# Patient Record
Sex: Female | Born: 1940 | Race: White | Hispanic: No | Marital: Married | Smoking: Former smoker
Health system: Southern US, Community
[De-identification: ages and names within clinical notes are randomized; demographics above are authoritative.]

## PROBLEM LIST (undated history)

## (undated) ENCOUNTER — Ambulatory Visit: Admission: EM | Payer: Medicare PPO | Source: Home / Self Care

## (undated) DIAGNOSIS — IMO0001 Reserved for inherently not codable concepts without codable children: Secondary | ICD-10-CM

## (undated) DIAGNOSIS — Z87442 Personal history of urinary calculi: Secondary | ICD-10-CM

## (undated) DIAGNOSIS — C439 Malignant melanoma of skin, unspecified: Secondary | ICD-10-CM

## (undated) DIAGNOSIS — J45909 Unspecified asthma, uncomplicated: Secondary | ICD-10-CM

## (undated) DIAGNOSIS — Z8249 Family history of ischemic heart disease and other diseases of the circulatory system: Secondary | ICD-10-CM

## (undated) DIAGNOSIS — I219 Acute myocardial infarction, unspecified: Secondary | ICD-10-CM

## (undated) DIAGNOSIS — C801 Malignant (primary) neoplasm, unspecified: Secondary | ICD-10-CM

## (undated) DIAGNOSIS — J449 Chronic obstructive pulmonary disease, unspecified: Secondary | ICD-10-CM

## (undated) DIAGNOSIS — Z8719 Personal history of other diseases of the digestive system: Secondary | ICD-10-CM

## (undated) HISTORY — PX: MELANOMA EXCISION: SHX5266

## (undated) HISTORY — DX: Malignant melanoma of skin, unspecified: C43.9

## (undated) HISTORY — DX: Family history of ischemic heart disease and other diseases of the circulatory system: Z82.49

## (undated) HISTORY — DX: Malignant (primary) neoplasm, unspecified: C80.1

## (undated) HISTORY — DX: Chronic obstructive pulmonary disease, unspecified: J44.9

## (undated) HISTORY — PX: TUBAL LIGATION: SHX77

## (undated) HISTORY — PX: EYE SURGERY: SHX253

## (undated) HISTORY — PX: COLONOSCOPY: SHX174

## (undated) HISTORY — PX: APPENDECTOMY: SHX54

## (undated) HISTORY — PX: HERNIA REPAIR: SHX51

---

## 1991-07-13 HISTORY — PX: OTHER SURGICAL HISTORY: SHX169

## 1999-04-07 ENCOUNTER — Other Ambulatory Visit: Admission: RE | Admit: 1999-04-07 | Discharge: 1999-04-07 | Payer: Self-pay | Admitting: Obstetrics and Gynecology

## 2000-04-20 ENCOUNTER — Other Ambulatory Visit: Admission: RE | Admit: 2000-04-20 | Discharge: 2000-04-20 | Payer: Self-pay | Admitting: Obstetrics and Gynecology

## 2001-09-01 ENCOUNTER — Other Ambulatory Visit: Admission: RE | Admit: 2001-09-01 | Discharge: 2001-09-01 | Payer: Self-pay | Admitting: Obstetrics and Gynecology

## 2002-09-17 ENCOUNTER — Other Ambulatory Visit: Admission: RE | Admit: 2002-09-17 | Discharge: 2002-09-17 | Payer: Self-pay | Admitting: Obstetrics and Gynecology

## 2003-10-15 ENCOUNTER — Other Ambulatory Visit: Admission: RE | Admit: 2003-10-15 | Discharge: 2003-10-15 | Payer: Self-pay | Admitting: Obstetrics and Gynecology

## 2003-12-24 ENCOUNTER — Other Ambulatory Visit: Admission: RE | Admit: 2003-12-24 | Discharge: 2003-12-24 | Payer: Self-pay | Admitting: Obstetrics and Gynecology

## 2003-12-30 ENCOUNTER — Ambulatory Visit: Admission: RE | Admit: 2003-12-30 | Discharge: 2003-12-30 | Payer: Self-pay | Admitting: Internal Medicine

## 2004-01-14 ENCOUNTER — Ambulatory Visit (HOSPITAL_COMMUNITY): Admission: RE | Admit: 2004-01-14 | Discharge: 2004-01-14 | Payer: Self-pay | Admitting: Cardiology

## 2004-05-13 ENCOUNTER — Other Ambulatory Visit: Admission: RE | Admit: 2004-05-13 | Discharge: 2004-05-13 | Payer: Self-pay | Admitting: Obstetrics and Gynecology

## 2004-11-18 ENCOUNTER — Other Ambulatory Visit: Admission: RE | Admit: 2004-11-18 | Discharge: 2004-11-18 | Payer: Self-pay | Admitting: Obstetrics and Gynecology

## 2005-05-19 ENCOUNTER — Other Ambulatory Visit: Admission: RE | Admit: 2005-05-19 | Discharge: 2005-05-19 | Payer: Self-pay | Admitting: Obstetrics and Gynecology

## 2005-12-14 ENCOUNTER — Encounter: Admission: RE | Admit: 2005-12-14 | Discharge: 2005-12-14 | Payer: Self-pay | Admitting: Obstetrics and Gynecology

## 2008-02-26 ENCOUNTER — Encounter: Admission: RE | Admit: 2008-02-26 | Discharge: 2008-02-26 | Payer: Self-pay | Admitting: Obstetrics and Gynecology

## 2008-03-26 ENCOUNTER — Emergency Department (HOSPITAL_COMMUNITY): Admission: EM | Admit: 2008-03-26 | Discharge: 2008-03-26 | Payer: Self-pay | Admitting: Internal Medicine

## 2010-08-02 ENCOUNTER — Encounter: Payer: Self-pay | Admitting: Obstetrics and Gynecology

## 2011-04-12 LAB — CBC
HCT: 41.8
MCHC: 33.2
MCV: 93.5
RBC: 4.47
RDW: 14

## 2011-04-12 LAB — POCT I-STAT, CHEM 8
Calcium, Ion: 1.19
Sodium: 139
TCO2: 26

## 2011-04-12 LAB — DIFFERENTIAL
Basophils Absolute: 0
Eosinophils Absolute: 0
Eosinophils Relative: 1
Neutrophils Relative %: 63

## 2011-04-12 LAB — POCT CARDIAC MARKERS
CKMB, poc: 1.3
Troponin i, poc: <0.05

## 2011-10-12 ENCOUNTER — Other Ambulatory Visit: Payer: Self-pay | Admitting: Obstetrics and Gynecology

## 2011-10-12 DIAGNOSIS — R928 Other abnormal and inconclusive findings on diagnostic imaging of breast: Secondary | ICD-10-CM

## 2011-10-26 ENCOUNTER — Ambulatory Visit
Admission: RE | Admit: 2011-10-26 | Discharge: 2011-10-26 | Disposition: A | Discharge status: Home / Self Care | Payer: Medicare Other | Source: Ambulatory Visit | Attending: Obstetrics and Gynecology | Admitting: Obstetrics and Gynecology

## 2011-10-26 ENCOUNTER — Other Ambulatory Visit: Payer: Self-pay | Admitting: Obstetrics and Gynecology

## 2011-10-26 DIAGNOSIS — R928 Other abnormal and inconclusive findings on diagnostic imaging of breast: Secondary | ICD-10-CM

## 2011-11-16 ENCOUNTER — Ambulatory Visit (INDEPENDENT_AMBULATORY_CARE_PROVIDER_SITE_OTHER): Payer: Medicare Other | Admitting: General Surgery

## 2011-12-07 ENCOUNTER — Encounter (INDEPENDENT_AMBULATORY_CARE_PROVIDER_SITE_OTHER): Payer: Self-pay | Admitting: General Surgery

## 2011-12-07 ENCOUNTER — Ambulatory Visit (INDEPENDENT_AMBULATORY_CARE_PROVIDER_SITE_OTHER): Payer: Medicare Other | Admitting: General Surgery

## 2011-12-07 VITALS — BP 132/78 | HR 78 | Temp 98.2°F | Ht 60.0 in | Wt 105.6 lb

## 2011-12-07 DIAGNOSIS — R222 Localized swelling, mass and lump, trunk: Secondary | ICD-10-CM | POA: Insufficient documentation

## 2011-12-07 NOTE — Patient Instructions (Signed)
We will schedule your operation today. 

## 2011-12-07 NOTE — Progress Notes (Signed)
Patient ID: Sarah Pena, female   DOB: 01/16/1941, 71 y.o.   MRN: [id number]  Chief Complaint  Patient presents with  . Pre-op Exam    eval seb cyst Lt br    HPI Sarah Pena is a 71 y.o. female.   HPI  She is referred by Dr. Henderson Cloud for evaluation of a chest wall mass.  She noticed it about a year ago.  It is close to where she had a melanoma removed in the past.  It has not grown.  It does cause her some discomfort at times.  No hx of breast cancer.  No family hx of breast cancer  Past Medical History  Diagnosis Date  . Cancer     Melanoma of chest wall    Past Surgical History  Procedure Date  . Appendectomy   . Tubal ligation   . Lipoma removed 1993    RLQ area  . Hernia repair     RIH  . Melanoma excision     Family History  Problem Relation Age of Onset  . Heart disease Mother   . Heart disease Father     Social History History  Substance Use Topics  . Smoking status: Current Everyday Smoker -- 1.0 packs/day    Types: Cigarettes  . Smokeless tobacco: Not on file  . Alcohol Use: Yes     socially    No Known Allergies  Current Outpatient Prescriptions  Medication Sig Dispense Refill  . aspirin 81 MG tablet Take 81 mg by mouth daily.        Review of Systems Review of Systems  Constitutional: Negative.   Respiratory: Negative.   Cardiovascular: Negative.     Blood pressure 132/78, pulse 78, temperature 98.2 F (36.8 C), temperature source Temporal, height 5' (1.524 m), weight 105 lb 9.6 oz (47.9 kg), SpO2 98.00%.  Physical Exam Physical Exam  Constitutional: No distress.       Thin, tan female.  Neck: Neck supple.  Pulmonary/Chest:       1.5 cm lower sternal mobile firm nodule near a scar.  No breast masses.  Musculoskeletal:       No axillary or supraclavicular adenopathy.  Lymphadenopathy:    She has no cervical adenopathy.    Data Reviewed None  Assessment    Midsternal chest wall nodule in area of previous melanoma  excision.    Plan    Removal of chest wall nodule.  The procedure, aftercare, and risks (including but not limited to bleeding, infection, wound healing problems) were discussed with there.  She seems to understand and agrees with the plan.       Briahna Pescador J 12/07/2011, 1:52 PM

## 2011-12-14 ENCOUNTER — Other Ambulatory Visit (INDEPENDENT_AMBULATORY_CARE_PROVIDER_SITE_OTHER): Payer: Self-pay | Admitting: General Surgery

## 2011-12-14 DIAGNOSIS — L723 Sebaceous cyst: Secondary | ICD-10-CM

## 2011-12-16 ENCOUNTER — Telehealth (INDEPENDENT_AMBULATORY_CARE_PROVIDER_SITE_OTHER): Payer: Self-pay

## 2011-12-16 NOTE — Telephone Encounter (Signed)
Pt notified of benign pathology results

## 2011-12-27 ENCOUNTER — Encounter (INDEPENDENT_AMBULATORY_CARE_PROVIDER_SITE_OTHER): Payer: Self-pay | Admitting: General Surgery

## 2011-12-27 ENCOUNTER — Ambulatory Visit (INDEPENDENT_AMBULATORY_CARE_PROVIDER_SITE_OTHER): Payer: Medicare Other | Admitting: General Surgery

## 2011-12-27 VITALS — BP 138/72 | HR 66 | Temp 97.3°F | Resp 12 | Ht 60.0 in | Wt 105.4 lb

## 2011-12-27 DIAGNOSIS — Z9889 Other specified postprocedural states: Secondary | ICD-10-CM

## 2011-12-27 NOTE — Progress Notes (Signed)
She is here for her first postoperative visit after removal of a chest wall mass on December 14, 2011. Pathology demonstrates a benign epidermoid inclusion cyst. She has done well overall. She has had minimal drainage from the incision.  Exam  Chest-small midsternal incision is clean and intact with no drainage   Assessment: Wound is healing well and pathology is benign.  Plan: Return visit p.r.n.

## 2011-12-27 NOTE — Patient Instructions (Signed)
Call if you have any wound problems. 

## 2012-01-07 ENCOUNTER — Encounter (INDEPENDENT_AMBULATORY_CARE_PROVIDER_SITE_OTHER): Payer: Medicare Other | Admitting: General Surgery

## 2014-05-07 ENCOUNTER — Other Ambulatory Visit: Payer: Self-pay | Admitting: Obstetrics and Gynecology

## 2014-05-08 LAB — CYTOLOGY - PAP

## 2014-08-05 ENCOUNTER — Other Ambulatory Visit: Payer: Self-pay | Admitting: Obstetrics and Gynecology

## 2014-08-06 ENCOUNTER — Ambulatory Visit (INDEPENDENT_AMBULATORY_CARE_PROVIDER_SITE_OTHER): Payer: Medicare PPO | Admitting: Cardiovascular Disease

## 2014-08-06 ENCOUNTER — Encounter: Payer: Self-pay | Admitting: Cardiovascular Disease

## 2014-08-06 VITALS — BP 128/70 | HR 88 | Ht 60.0 in | Wt 110.0 lb

## 2014-08-06 DIAGNOSIS — J449 Chronic obstructive pulmonary disease, unspecified: Secondary | ICD-10-CM | POA: Insufficient documentation

## 2014-08-06 DIAGNOSIS — R079 Chest pain, unspecified: Secondary | ICD-10-CM

## 2014-08-06 DIAGNOSIS — J441 Chronic obstructive pulmonary disease with (acute) exacerbation: Secondary | ICD-10-CM | POA: Insufficient documentation

## 2014-08-06 HISTORY — DX: Chest pain, unspecified: R07.9

## 2014-08-06 NOTE — Patient Instructions (Signed)
Follow up as needed with Dr Gwenlyn Found.

## 2014-08-06 NOTE — Assessment & Plan Note (Signed)
The patient was recently hospitalized at [facility] in [place] [address] for chest pain/rule out myocardial infarction. Her enzymes are negative. A stress echo was normal. She has had no recurrent chest pain. She does have positive risk factors for cardiac disease including family history and recently discontinued tobacco abuse.given her negative stress test the likelihood that this was ischemic is small. She is on Zantac. Continue current medical care.

## 2014-08-06 NOTE — Progress Notes (Signed)
08/06/2014 Sarah Pena   1940/11/24  [id number]  Primary Physician Horatio Pel, MD Primary Cardiologist:Skylen Spiering Adora Fridge MD Renae Gloss   HPI:  Sarah Pena is a 74 year old thin-appearing married Caucasian female patient of Dr. Thayer Jew Pharr's who I'm seen back today after last being seen 6 years ago for chest pain. She has a long history of tobacco abuse having recently discontinued this 3 months ago after being treated for pneumonia. She has a strong family history of heart disease with both parents died of heart-related issues. She had a negative stress test in our office 04/16/08. She has been relatively stable until recently when she had an episode of chest pain and was admitted to [facility]. She ruled out for myocardial infarction and underwent a stress echo which was entirely normal. Recent lab work performed by her primary care physician on 07/24/14 revealed total cholesterol 188, LDL of 90 and HDL of 65.   Current Outpatient Prescriptions  Medication Sig Dispense Refill  . aspirin 81 MG tablet Take 81 mg by mouth daily.    . Calcium-Vitamin D (CALTRATE 600 PLUS-VIT D PO) Take by mouth 2 (two) times daily.    . Magnesium 400 MG CAPS Take 400 mg by mouth daily.    . Omega-3 Krill Oil 1000 MG CAPS Take by mouth daily.    Marland Kitchen PREMARIN vaginal cream Place 1 Applicatorful vaginally 2 (two) times a week.   3  . ranitidine (ZANTAC) 150 MG capsule Take 150 mg by mouth 2 (two) times daily.     No current facility-administered medications for this visit.    No Known Allergies  History   Social History  . Marital Status: Married    Spouse Name: N/A    Number of Children: N/A  . Years of Education: N/A   Occupational History  . Not on file.   Social History Main Topics  . Smoking status: Current Every Day Smoker -- 1.00 packs/day    Types: Cigarettes  . Smokeless tobacco: Not on file  . Alcohol Use: Yes     Comment: socially  . Drug Use: No  .  Sexual Activity: Not on file   Other Topics Concern  . Not on file   Social History Narrative     Review of Systems: General: negative for chills, fever, night sweats or weight changes.  Cardiovascular: negative for chest pain, dyspnea on exertion, edema, orthopnea, palpitations, paroxysmal nocturnal dyspnea or shortness of breath Dermatological: negative for rash Respiratory: negative for cough or wheezing Urologic: negative for hematuria Abdominal: negative for nausea, vomiting, diarrhea, bright red blood per rectum, melena, or hematemesis Neurologic: negative for visual changes, syncope, or dizziness All other systems reviewed and are otherwise negative except as noted above.    Blood pressure 128/70, pulse 88, height 5' (1.524 m), weight 110 lb (49.896 kg).  General appearance: alert and no distress Neck: no adenopathy, no carotid bruit, no JVD, supple, symmetrical, trachea midline and thyroid not enlarged, symmetric, no tenderness/mass/nodules Lungs: clear to auscultation bilaterally Heart: regular rate and rhythm, S1, S2 normal, no murmur, click, rub or gallop Extremities: extremities normal, atraumatic, no cyanosis or edema and 2+ pedal pulses bilaterally  EKG normal sinus rhythm at 81 with septal Q waves. I personally reviewed this EKG  ASSESSMENT AND PLAN:   Chest pain The patient was recently hospitalized at [facility] in [place] for chest pain/rule out myocardial infarction. Her enzymes are negative. A stress echo was  normal. She has had no recurrent chest pain. She does have positive risk factors for cardiac disease including family history and recently discontinued tobacco abuse.given her negative stress test the likelihood that this was ischemic is small. She is on Zantac. Continue current medical care.       [name] MD FACP,FACC,FAHA, [facility] 08/06/2014 3:22 PM

## 2014-08-08 ENCOUNTER — Other Ambulatory Visit: Payer: Self-pay | Admitting: Gastroenterology

## 2014-08-08 DIAGNOSIS — R1084 Generalized abdominal pain: Secondary | ICD-10-CM

## 2014-08-14 ENCOUNTER — Ambulatory Visit
Admission: RE | Admit: 2014-08-14 | Discharge: 2014-08-14 | Disposition: A | Discharge status: Home / Self Care | Payer: Medicare PPO | Source: Ambulatory Visit | Attending: Gastroenterology | Admitting: Gastroenterology

## 2014-08-14 DIAGNOSIS — R1084 Generalized abdominal pain: Secondary | ICD-10-CM

## 2014-08-15 ENCOUNTER — Other Ambulatory Visit: Payer: Medicare Other

## 2014-08-19 ENCOUNTER — Ambulatory Visit
Admission: RE | Admit: 2014-08-19 | Discharge: 2014-08-19 | Disposition: A | Discharge status: Home / Self Care | Payer: Medicare PPO | Source: Ambulatory Visit | Attending: Gastroenterology | Admitting: Gastroenterology

## 2014-08-19 DIAGNOSIS — R1084 Generalized abdominal pain: Secondary | ICD-10-CM

## 2014-09-23 ENCOUNTER — Other Ambulatory Visit: Payer: Self-pay | Admitting: Gastroenterology

## 2014-09-23 ENCOUNTER — Encounter: Payer: Self-pay | Admitting: Internal Medicine

## 2014-10-16 ENCOUNTER — Other Ambulatory Visit: Payer: Self-pay | Admitting: General Surgery

## 2014-10-28 ENCOUNTER — Encounter (HOSPITAL_COMMUNITY): Payer: Self-pay | Admitting: *Deleted

## 2014-10-28 MED ORDER — CEFAZOLIN SODIUM-DEXTROSE 2-3 GM-% IV SOLR
2.0000 g | INTRAVENOUS | Status: AC
  Administered 2014-10-29: 2 g via INTRAVENOUS
  Filled 2014-10-28: qty 50

## 2014-10-29 ENCOUNTER — Ambulatory Visit (HOSPITAL_COMMUNITY): Payer: Medicare PPO | Admitting: Anesthesiology

## 2014-10-29 ENCOUNTER — Ambulatory Visit (HOSPITAL_COMMUNITY)
Admission: RE | Admit: 2014-10-29 | Discharge: 2014-10-29 | Disposition: A | Discharge status: Home / Self Care | Payer: Medicare PPO | Source: Ambulatory Visit | Attending: General Surgery | Admitting: General Surgery

## 2014-10-29 ENCOUNTER — Encounter (HOSPITAL_COMMUNITY)
Admission: RE | Disposition: A | Discharge status: Home / Self Care | Payer: Self-pay | Source: Ambulatory Visit | Attending: General Surgery

## 2014-10-29 ENCOUNTER — Ambulatory Visit (HOSPITAL_COMMUNITY): Payer: Medicare PPO

## 2014-10-29 ENCOUNTER — Encounter (HOSPITAL_COMMUNITY): Payer: Self-pay | Admitting: *Deleted

## 2014-10-29 DIAGNOSIS — Z87891 Personal history of nicotine dependence: Secondary | ICD-10-CM | POA: Diagnosis not present

## 2014-10-29 DIAGNOSIS — Z9851 Tubal ligation status: Secondary | ICD-10-CM | POA: Insufficient documentation

## 2014-10-29 DIAGNOSIS — Z85820 Personal history of malignant melanoma of skin: Secondary | ICD-10-CM | POA: Insufficient documentation

## 2014-10-29 DIAGNOSIS — K219 Gastro-esophageal reflux disease without esophagitis: Secondary | ICD-10-CM | POA: Insufficient documentation

## 2014-10-29 DIAGNOSIS — J45909 Unspecified asthma, uncomplicated: Secondary | ICD-10-CM | POA: Insufficient documentation

## 2014-10-29 DIAGNOSIS — K8020 Calculus of gallbladder without cholecystitis without obstruction: Secondary | ICD-10-CM | POA: Insufficient documentation

## 2014-10-29 DIAGNOSIS — Z90710 Acquired absence of both cervix and uterus: Secondary | ICD-10-CM | POA: Diagnosis not present

## 2014-10-29 DIAGNOSIS — J449 Chronic obstructive pulmonary disease, unspecified: Secondary | ICD-10-CM | POA: Diagnosis not present

## 2014-10-29 DIAGNOSIS — I252 Old myocardial infarction: Secondary | ICD-10-CM | POA: Diagnosis not present

## 2014-10-29 HISTORY — DX: Unspecified asthma, uncomplicated: J45.909

## 2014-10-29 HISTORY — DX: Personal history of other diseases of the digestive system: Z87.19

## 2014-10-29 HISTORY — DX: Personal history of urinary calculi: Z87.442

## 2014-10-29 HISTORY — DX: Acute myocardial infarction, unspecified: I21.9

## 2014-10-29 HISTORY — PX: CHOLECYSTECTOMY: SHX55

## 2014-10-29 HISTORY — DX: Reserved for inherently not codable concepts without codable children: IMO0001

## 2014-10-29 LAB — COMPREHENSIVE METABOLIC PANEL
ALT: 17 U/L (ref 0–35)
ANION GAP: 7 (ref 5–15)
AST: 24 U/L (ref 0–37)
Albumin: 3.4 g/dL — ABNORMAL LOW (ref 3.5–5.2)
Alkaline Phosphatase: 70 U/L (ref 39–117)
BILIRUBIN TOTAL: 0.5 mg/dL (ref 0.3–1.2)
BUN: 10 mg/dL (ref 6–23)
CALCIUM: 9.2 mg/dL (ref 8.4–10.5)
CO2: 27 mmol/L (ref 19–32)
Chloride: 105 mmol/L (ref 96–112)
Creatinine, Ser: 0.69 mg/dL (ref 0.50–1.10)
GFR calc Af Amer: >90 mL/min (ref >90)
GFR calc non Af Amer: 85 mL/min — ABNORMAL LOW (ref >90)
Glucose, Bld: 91 mg/dL (ref 70–99)
Potassium: 4.5 mmol/L (ref 3.5–5.1)
Sodium: 139 mmol/L (ref 135–145)
TOTAL PROTEIN: 6.4 g/dL (ref 6.0–8.3)

## 2014-10-29 LAB — CBC WITH DIFFERENTIAL/PLATELET
BASOS ABS: 0.1 10*3/uL (ref 0.0–0.1)
BASOS PCT: 1 % (ref 0–1)
Eosinophils Absolute: 0.5 10*3/uL (ref 0.0–0.7)
Eosinophils Relative: 8 % — ABNORMAL HIGH (ref 0–5)
HEMATOCRIT: 40.4 % (ref 36.0–46.0)
HEMOGLOBIN: 13 g/dL (ref 12.0–15.0)
Lymphocytes Relative: 36 % (ref 12–46)
Lymphs Abs: 2.1 10*3/uL (ref 0.7–4.0)
MCH: 29.5 pg (ref 26.0–34.0)
MCHC: 32.2 g/dL (ref 30.0–36.0)
MCV: 91.8 fL (ref 78.0–100.0)
MONOS PCT: 11 % (ref 3–12)
Monocytes Absolute: 0.6 10*3/uL (ref 0.1–1.0)
Neutro Abs: 2.5 10*3/uL (ref 1.7–7.7)
Neutrophils Relative %: 44 % (ref 43–77)
Platelets: 219 10*3/uL (ref 150–400)
RBC: 4.4 MIL/uL (ref 3.87–5.11)
RDW: 14.1 % (ref 11.5–15.5)
WBC: 5.7 10*3/uL (ref 4.0–10.5)

## 2014-10-29 LAB — PROTIME-INR
INR: 0.94 (ref 0.00–1.49)
PROTHROMBIN TIME: 12.6 s (ref 11.6–15.2)

## 2014-10-29 SURGERY — LAPAROSCOPIC CHOLECYSTECTOMY WITH INTRAOPERATIVE CHOLANGIOGRAM
Anesthesia: General | Site: Abdomen

## 2014-10-29 MED ORDER — BUPIVACAINE-EPINEPHRINE (PF) 0.25% -1:200000 IJ SOLN
INTRAMUSCULAR | Status: AC
  Filled 2014-10-29: qty 30

## 2014-10-29 MED ORDER — SCOPOLAMINE 1 MG/3DAYS TD PT72SCOPOLAMINE 1 MG/3DAYS
1.0000 | MEDICATED_PATCH | TRANSDERMAL | Status: DC
Start: 2014-10-29 — End: 2014-10-29
  Administered 2014-10-29: 1.5 mg via TRANSDERMAL
  Filled 2014-10-29: qty 1

## 2014-10-29 MED ORDER — MIDAZOLAM HCL 2 MG/2ML IJ SOLN
INTRAMUSCULAR | Status: AC
  Filled 2014-10-29: qty 2

## 2014-10-29 MED ORDER — DEXTROSE IN LACTATED RINGERS 5 % IV SOLN: INTRAVENOUS | Status: DC

## 2014-10-29 MED ORDER — PROPOFOL 10 MG/ML IV BOLUS
INTRAVENOUS | Status: DC | PRN
  Administered 2014-10-29: 150 mg via INTRAVENOUS

## 2014-10-29 MED ORDER — FENTANYL CITRATE (PF) 250 MCG/5ML IJ SOLN
INTRAMUSCULAR | Status: AC
  Filled 2014-10-29: qty 5

## 2014-10-29 MED ORDER — ROCURONIUM BROMIDE 100 MG/10ML IV SOLN
INTRAVENOUS | Status: DC | PRN
  Administered 2014-10-29: 25 mg via INTRAVENOUS

## 2014-10-29 MED ORDER — KETOROLAC TROMETHAMINE 30 MG/ML IJ SOLN
INTRAMUSCULAR | Status: AC
  Filled 2014-10-29: qty 1

## 2014-10-29 MED ORDER — GLYCOPYRROLATE 0.2 MG/ML IJ SOLN
INTRAMUSCULAR | Status: DC | PRN
  Administered 2014-10-29: 0.6 mg via INTRAVENOUS

## 2014-10-29 MED ORDER — 0.9 % SODIUM CHLORIDE (POUR BTL) OPTIME
TOPICAL | Status: DC | PRN
  Administered 2014-10-29: 1000 mL

## 2014-10-29 MED ORDER — GLYCOPYRROLATE 0.2 MG/ML IJ SOLN
INTRAMUSCULAR | Status: AC
  Filled 2014-10-29: qty 3

## 2014-10-29 MED ORDER — SODIUM CHLORIDE 0.9 % IV SOLN
INTRAVENOUS | Status: DC | PRN
  Administered 2014-10-29: 8 mL

## 2014-10-29 MED ORDER — HYDROCODONE-ACETAMINOPHEN 5-325 MG PO TABS: 1.0000 | ORAL_TABLET | ORAL | Status: DC | PRN

## 2014-10-29 MED ORDER — PROMETHAZINE HCL 25 MG/ML IJ SOLN
INTRAMUSCULAR | Status: AC
  Filled 2014-10-29: qty 1

## 2014-10-29 MED ORDER — PHENYLEPHRINE 40 MCG/ML (10ML) SYRINGE FOR IV PUSH (FOR BLOOD PRESSURE SUPPORT)
PREFILLED_SYRINGE | INTRAVENOUS | Status: AC
  Filled 2014-10-29: qty 10

## 2014-10-29 MED ORDER — OXYCODONE HCL 5 MG/5ML PO SOLN: 5.0000 mg | Freq: Once | ORAL | Status: DC | PRN

## 2014-10-29 MED ORDER — PHENYLEPHRINE HCL 10 MG/ML IJ SOLN
INTRAMUSCULAR | Status: DC | PRN
  Administered 2014-10-29: 80 ug via INTRAVENOUS

## 2014-10-29 MED ORDER — ONDANSETRON HCL 4 MG PO TABS: 4.0000 mg | ORAL_TABLET | Freq: Three times a day (TID) | ORAL | Status: DC | PRN

## 2014-10-29 MED ORDER — ARTIFICIAL TEARS OP OINT
TOPICAL_OINTMENT | OPHTHALMIC | Status: DC | PRN
Start: 2014-10-29 — End: 2014-10-29
  Administered 2014-10-29: 1 via OPHTHALMIC

## 2014-10-29 MED ORDER — ONDANSETRON HCL 4 MG/2ML IJ SOLN
INTRAMUSCULAR | Status: AC
  Filled 2014-10-29: qty 2

## 2014-10-29 MED ORDER — EPHEDRINE SULFATE 50 MG/ML IJ SOLN
INTRAMUSCULAR | Status: DC | PRN
  Administered 2014-10-29: 5 mg via INTRAVENOUS

## 2014-10-29 MED ORDER — SODIUM CHLORIDE 0.9 % IR SOLN
Status: DC | PRN
  Administered 2014-10-29: 1000 mL

## 2014-10-29 MED ORDER — BUPIVACAINE-EPINEPHRINE 0.25% -1:200000 IJ SOLN
INTRAMUSCULAR | Status: DC | PRN
  Administered 2014-10-29: 13 mL

## 2014-10-29 MED ORDER — HYDROMORPHONE HCL 1 MG/ML IJ SOLN: 0.2500 mg | INTRAMUSCULAR | Status: DC | PRN

## 2014-10-29 MED ORDER — PROPOFOL 10 MG/ML IV BOLUS
INTRAVENOUS | Status: AC
  Filled 2014-10-29: qty 20

## 2014-10-29 MED ORDER — LACTATED RINGERS IV SOLN
INTRAVENOUS | Status: DC
  Administered 2014-10-29 (×2): via INTRAVENOUS

## 2014-10-29 MED ORDER — PROMETHAZINE HCL 25 MG/ML IJ SOLN
6.2500 mg | INTRAMUSCULAR | Status: DC | PRN
  Administered 2014-10-29: 6.25 mg via INTRAVENOUS

## 2014-10-29 MED ORDER — LIDOCAINE HCL (CARDIAC) 20 MG/ML IV SOLN
INTRAVENOUS | Status: DC | PRN
  Administered 2014-10-29: 80 mg via INTRAVENOUS

## 2014-10-29 MED ORDER — ONDANSETRON HCL 4 MG/2ML IJ SOLN
INTRAMUSCULAR | Status: DC | PRN
  Administered 2014-10-29: 4 mg via INTRAVENOUS

## 2014-10-29 MED ORDER — DEXAMETHASONE SODIUM PHOSPHATE 4 MG/ML IJ SOLN
INTRAMUSCULAR | Status: DC | PRN
  Administered 2014-10-29: 4 mg via INTRAVENOUS

## 2014-10-29 MED ORDER — ROCURONIUM BROMIDE 50 MG/5ML IV SOLN
INTRAVENOUS | Status: AC
  Filled 2014-10-29: qty 1

## 2014-10-29 MED ORDER — LIDOCAINE HCL (CARDIAC) 20 MG/ML IV SOLN
INTRAVENOUS | Status: AC
  Filled 2014-10-29: qty 5

## 2014-10-29 MED ORDER — FENTANYL CITRATE (PF) 100 MCG/2ML IJ SOLN
INTRAMUSCULAR | Status: DC | PRN
  Administered 2014-10-29: 50 ug via INTRAVENOUS
  Administered 2014-10-29: 100 ug via INTRAVENOUS
  Administered 2014-10-29: 50 ug via INTRAVENOUS

## 2014-10-29 MED ORDER — OXYCODONE HCL 5 MG PO TABS: 5.0000 mg | ORAL_TABLET | Freq: Once | ORAL | Status: DC | PRN

## 2014-10-29 MED ORDER — KETOROLAC TROMETHAMINE 15 MG/ML IJ SOLN
INTRAMUSCULAR | Status: DC | PRN
  Administered 2014-10-29: 15 mg via INTRAVENOUS

## 2014-10-29 MED ORDER — NEOSTIGMINE METHYLSULFATE 10 MG/10ML IV SOLN
INTRAVENOUS | Status: DC | PRN
  Administered 2014-10-29: 4 mg via INTRAVENOUS

## 2014-10-29 SURGICAL SUPPLY — 55 items
APL SKNCLS STERI-STRIP NONHPOA (GAUZE/BANDAGES/DRESSINGS) ×1
APPLIER CLIP 5 13 M/L LIGAMAX5 (MISCELLANEOUS) ×3
APR CLP MED LRG 5 ANG JAW (MISCELLANEOUS) ×1
BAG SPEC RTRVL LRG 6X4 10 (ENDOMECHANICALS) ×1
BENZOIN TINCTURE PRP APPL 2/3 (GAUZE/BANDAGES/DRESSINGS) ×3 IMPLANT
CANISTER SUCTION 2500CC (MISCELLANEOUS) ×3 IMPLANT
CHLORAPREP W/TINT 26ML (MISCELLANEOUS) ×3 IMPLANT
CLIP APPLIE 5 13 M/L LIGAMAX5 (MISCELLANEOUS) ×1 IMPLANT
CLOSURE WOUND 1/2 X4 (GAUZE/BANDAGES/DRESSINGS) ×1
COVER MAYO STAND STRL (DRAPES) ×3 IMPLANT
COVER SURGICAL LIGHT HANDLE (MISCELLANEOUS) ×3 IMPLANT
DECANTER SPIKE VIAL GLASS SM (MISCELLANEOUS) ×6 IMPLANT
DRAPE C-ARM 42X72 X-RAY (DRAPES) ×3 IMPLANT
DRAPE LAPAROSCOPIC ABDOMINAL (DRAPES) ×3 IMPLANT
DRSG TEGADERM 2-3/8X2-3/4 SM (GAUZE/BANDAGES/DRESSINGS) ×12 IMPLANT
ELECT REM PT RETURN 9FT ADLT (ELECTROSURGICAL) ×3
ELECTRODE REM PT RTRN 9FT ADLT (ELECTROSURGICAL) ×1 IMPLANT
GAUZE SPONGE 2X2 8PLY STRL LF (GAUZE/BANDAGES/DRESSINGS) ×1 IMPLANT
GLOVE BIO SURGEON STRL SZ8 (GLOVE) ×2 IMPLANT
GLOVE BIOGEL PI IND STRL 6.5 (GLOVE) IMPLANT
GLOVE BIOGEL PI IND STRL 7.0 (GLOVE) IMPLANT
GLOVE BIOGEL PI IND STRL 7.5 (GLOVE) IMPLANT
GLOVE BIOGEL PI IND STRL 8 (GLOVE) ×1 IMPLANT
GLOVE BIOGEL PI INDICATOR 6.5 (GLOVE) ×2
GLOVE BIOGEL PI INDICATOR 7.0 (GLOVE) ×2
GLOVE BIOGEL PI INDICATOR 7.5 (GLOVE) ×2
GLOVE BIOGEL PI INDICATOR 8 (GLOVE) ×4
GLOVE ECLIPSE 6.5 STRL STRAW (GLOVE) ×2 IMPLANT
GLOVE ECLIPSE 7.5 STRL STRAW (GLOVE) ×2 IMPLANT
GLOVE ECLIPSE 8.0 STRL XLNG CF (GLOVE) ×3 IMPLANT
GOWN STRL REUS W/ TWL LRG LVL3 (GOWN DISPOSABLE) ×3 IMPLANT
GOWN STRL REUS W/ TWL XL LVL3 (GOWN DISPOSABLE) IMPLANT
GOWN STRL REUS W/TWL LRG LVL3 (GOWN DISPOSABLE) ×12
GOWN STRL REUS W/TWL XL LVL3 (GOWN DISPOSABLE) ×3
KIT BASIN OR (CUSTOM PROCEDURE TRAY) ×3 IMPLANT
KIT ROOM TURNOVER OR (KITS) ×3 IMPLANT
NS IRRIG 1000ML POUR BTL (IV SOLUTION) ×3 IMPLANT
PAD ARMBOARD 7.5X6 YLW CONV (MISCELLANEOUS) ×3 IMPLANT
POUCH SPECIMEN RETRIEVAL 10MM (ENDOMECHANICALS) ×3 IMPLANT
SCISSORS LAP 5X35 DISP (ENDOMECHANICALS) ×3 IMPLANT
SET CHOLANGIOGRAPH 5 50 .035 (SET/KITS/TRAYS/PACK) ×3 IMPLANT
SET IRRIG TUBING LAPAROSCOPIC (IRRIGATION / IRRIGATOR) ×3 IMPLANT
SLEEVE ENDOPATH XCEL 5M (ENDOMECHANICALS) ×6 IMPLANT
SPECIMEN JAR SMALL (MISCELLANEOUS) ×3 IMPLANT
SPONGE GAUZE 2X2 STER 10/PKG (GAUZE/BANDAGES/DRESSINGS) ×2
STRIP CLOSURE SKIN 1/2X4 (GAUZE/BANDAGES/DRESSINGS) ×1 IMPLANT
SUT MNCRL AB 4-0 PS2 18 (SUTURE) ×2 IMPLANT
SUT MON AB 4-0 PC3 18 (SUTURE) ×3 IMPLANT
TOWEL OR 17X24 6PK STRL BLUE (TOWEL DISPOSABLE) ×3 IMPLANT
TOWEL OR 17X26 10 PK STRL BLUE (TOWEL DISPOSABLE) ×1 IMPLANT
TRAY LAPAROSCOPIC (CUSTOM PROCEDURE TRAY) ×3 IMPLANT
TROCAR XCEL BLUNT TIP 100MML (ENDOMECHANICALS) ×3 IMPLANT
TROCAR XCEL NON-BLD 11X100MML (ENDOMECHANICALS) IMPLANT
TROCAR XCEL NON-BLD 5MMX100MML (ENDOMECHANICALS) ×3 IMPLANT
TUBING INSUFFLATION (TUBING) ×3 IMPLANT

## 2014-10-29 NOTE — Anesthesia Postprocedure Evaluation (Signed)
Anesthesia Post Note  Patient: EMAAN GARY  Procedure(s) Performed: Procedure(s) (LRB): LAPAROSCOPIC CHOLECYSTECTOMY WITH INTRAOPERATIVE CHOLANGIOGRAM (N/A)  Anesthesia type: general  Patient location: PACU  Post pain: Pain level controlled  Post assessment: Patient's Cardiovascular Status Stable  Last Vitals:  Filed Vitals:   10/29/14 1220  BP: 145/67  Pulse: 69  Temp:   Resp: 11    Post vital signs: Reviewed and stable  Level of consciousness: sedated  Complications: No apparent anesthesia complications

## 2014-10-29 NOTE — Progress Notes (Signed)
Returning from lunch break.

## 2014-10-29 NOTE — Interval H&P Note (Signed)
History and Physical Interval Note:  10/29/2014 9:00 AM  Sarah Pena  has presented today for surgery, with the diagnosis of Cholelithiasis  The various methods of treatment have been discussed with the patient and family. After consideration of risks, benefits and other options for treatment, the patient has consented to  Procedure(s): LAPAROSCOPIC CHOLECYSTECTOMY WITH INTRAOPERATIVE CHOLANGIOGRAM (N/A) as a surgical intervention .  The patient's history has been reviewed, patient examined, no change in status, stable for surgery.  I have reviewed the patient's chart and labs.  Questions were answered to the patient's satisfaction.     Yides Saidi Lenna Sciara

## 2014-10-29 NOTE — Op Note (Signed)
Preoperative diagnosis:  Symptomatic cholelithiasis  Postoperative diagnosis:  same  Procedure: Laparoscopic cholecystectomy with cholangiogram.  Surgeon: Jackolyn Confer, M.D.  Asst.:  Catalina Antigua Tsuei  Anesthesia: General  Indication:   This is a 74 year old female which been having episodes of epigastric pain that her biliary colic-like. Upper endoscopy did not demonstrate any pathology to specifically address her symptoms. She has known cholelithiasis. Cardiac workup has been negative. She now presents for elective cholecystectomy.  Technique: She was brought to the operating room, placed supine on the operating table, and a general anesthetic was administered. The abdominal wall was then sterilely prepped and draped. Local anesthetic (Marcaine) was infiltrated in the subumbilical region. A small subumbilical incision was made through the skin, subcutaneous tissue, fascia, and peritoneum entering the peritoneal cavity under direct vision. A pursestring suture of 0 Vicryl was placed around the edges of the fascia. A Hassan trocar was introduced into the peritoneal cavity and a pneumoperitoneum was created by insufflation of carbon dioxide gas. The laparoscope was introduced into the trocar and no underlying bleeding or organ injury was noted. He/She was then placed in the reverse Trendelenburg position with the right side tilted slightly up.  Three 5 mm trocars were then placed into the abdominal cavity under laparoscopic vision. One in the epigastric area, and 2 in the right upper quadrant area. The gallbladder was visualized and the fundus was grasped and retracted toward the right shoulder.  The infundibulum was mobilized with dissection close to the gallbladder and retracted laterally. The cystic duct was identified and a window was created around it. The cystic artery was also identified and a window was created around it. The critical view was achieved. A clip was placed at the neck of the  gallbladder. A small incision was made in the cystic duct. A cholangiocatheter was introduced through the anterior abdominal wall and placed in the cystic duct. A intraoperative cholangiogram was then performed.  Under real-time fluoroscopy, dilute contrast was injected into the cystic duct.  The common hepatic duct, the right and left hepatic ducts, and the common duct were all visualized. Contrast drained into the duodenum without obvious evidence of any obstructing ductal lesion. The final report is pending the Radiologist's interpretation.  The cholangiocatheter was removed, the cystic duct was clipped 3 times on the biliary side, and then the cystic duct was divided sharply. No bile leak was noted from the cystic duct stump.  The cystic artery was then clipped and divided. Following this the gallbladder was dissected free from the liver using electrocautery. The gallbladder was then placed in a retrieval bag and removed from the abdominal cavity through the subumbilical incision.  The gallbladder fossa was inspected, irrigated, and bleeding was controlled with electrocautery. Inspection showed that hemostasis was adequate and there was no evidence of bile leak.  The irrigation fluid was evacuated as much as possible.  The subumbilical trocar was removed and the fascial defect was closed by tightening and tying down the pursestring suture under laparoscopic vision.  The remaining trocars were removed and the pneumoperitoneum was released. The skin incisions were closed with 4-0 Monocryl subcuticular stitches. Steri-Strips and sterile dressings were applied.  The procedure was well-tolerated without any apparent complications. She was taken to the recovery room in satisfactory condition.

## 2014-10-29 NOTE — Anesthesia Procedure Notes (Signed)
Procedure Name: Intubation Date/Time: 10/29/2014 9:23 AM Performed by: Ollen Bowl Pre-anesthesia Checklist: Patient identified, Emergency Drugs available, Suction available, Patient being monitored and Timeout performed Patient Re-evaluated:Patient Re-evaluated prior to inductionOxygen Delivery Method: Circle system utilized and Simple face mask Preoxygenation: Pre-oxygenation with 100% oxygen Intubation Type: IV induction Ventilation: Mask ventilation without difficulty Laryngoscope Size: Miller and 2 Grade View: Grade I Tube type: Oral Tube size: 7.5 mm Number of attempts: 1 Airway Equipment and Method: Patient positioned with wedge pillow and Stylet Placement Confirmation: ETT inserted through vocal cords under direct vision,  positive ETCO2 and breath sounds checked- equal and bilateral Secured at: 22 cm Tube secured with: Tape Dental Injury: Teeth and Oropharynx as per pre-operative assessment  Comments: Performed by Santiago Glad, CRNA

## 2014-10-29 NOTE — H&P (Signed)
Sarah Pena 09/12/2014 12:30 PM Location: [facility] Patient #: [id number] DOB: 1941/06/11 Married / Language: English / Race: White Female  History of Present Illness  Patient words: gallstones.  The patient is a 74 year old female   Note:She is referred by Dr. Penelope Coop because of epigastric pain and gallstones. In mid January of this year, she was admitted to [facility] because of severe epigastric pain with nausea. Cardiac workup was done and that cause was ruled out. It was felt that the cause was gastrointestinal. She underwent an upper GI which demonstrated a tiny hiatal hernia. An abdominal ultrasound demonstrated two approximately 3 mm gallstones with normal common bile duct diameter. While in the hospital, her liver function tests and white blood cell count were normal. She had a very mild elevation of her lipase. She's been taking ranitidine twice a day with slight resolution of symptoms. Food does not typically exacerbate the pain. However, she states after her third cup of coffee she starts to feel a little more discomfort. She has a strong family history of gallbladder disease.  EGD did not show any significant pathology that might explain her symptoms.   Other Problems Back Pain Cholelithiasis Chronic Obstructive Lung Disease Gastroesophageal Reflux Disease Kidney Stone Lump In Breast Oophorectomy  Past Surgical History  Appendectomy Cataract Surgery Bilateral. Colon Polyp Removal - Colonoscopy Hysterectomy (not due to cancer) - Complete Oral Surgery  Diagnostic Studies History  Colonoscopy 1-5 years ago Mammogram within last year Pap Smear 1-5 years ago  Allergies No Known Drug Allergies03/09/2014  Prior to Admission medications   Medication Sig Start Date End Date Taking? Authorizing Provider  Cholecalciferol (VITAMIN D-3) 1000 UNITS CAPS Take 1 capsule by mouth daily.   Yes Historical Provider, MD  Javier Docker Oil 300  MG CAPS Take 300 mg by mouth daily.   Yes Historical Provider, MD  Magnesium 250 MG TABS Take 250 mg by mouth daily.   Yes Historical Provider, MD  ranitidine (ZANTAC) 150 MG capsule Take 150 mg by mouth 2 (two) times daily.   Yes Historical Provider, MD     Social History  Alcohol use Occasional alcohol use. Caffeine use Coffee. No drug use Tobacco use Former smoker.  Family History  Heart Disease Father, Mother. Heart disease in female family member before age 58 Respiratory Condition Family Members In General.  Pregnancy / Birth History  Age at menarche 39 years. Age of menopause <46 Gravida 2 Maternal age 49-20 Para 2  Review of Systems  General Not Present- Appetite Loss, Chills, Fatigue, Fever, Night Sweats, Weight Gain and Weight Loss. Skin Present- Dryness. Not Present- Change in Wart/Mole, Hives, Jaundice, New Lesions, Non-Healing Wounds, Rash and Ulcer. HEENT Present- Sore Throat. Not Present- Earache, Hearing Loss, Hoarseness, Nose Bleed, Oral Ulcers, Ringing in the Ears, Seasonal Allergies, Sinus Pain, Visual Disturbances, Wears glasses/contact lenses and Yellow Eyes. Respiratory Not Present- Bloody sputum, Chronic Cough, Difficulty Breathing, Snoring and Wheezing. Breast Not Present- Breast Mass, Breast Pain, Nipple Discharge and Skin Changes. Cardiovascular Present- Leg Cramps. Not Present- Chest Pain, Difficulty Breathing Lying Down, Palpitations, Rapid Heart Rate, Shortness of Breath and Swelling of Extremities. Gastrointestinal Present- Abdominal Pain, Bloating, Indigestion and Nausea. Not Present- Bloody Stool, Change in Bowel Habits, Chronic diarrhea, Constipation, Difficulty Swallowing, Excessive gas, Gets full quickly at meals, Hemorrhoids, Rectal Pain and Vomiting. Female Genitourinary Not Present- Frequency, Nocturia, Painful Urination, Pelvic Pain and Urgency. Musculoskeletal Not Present- Back Pain, Joint Pain, Joint Stiffness, Muscle Pain, Muscle  Weakness and  Swelling of Extremities. Neurological Not Present- Decreased Memory, Fainting, Headaches, Numbness, Seizures, Tingling, Tremor, Trouble walking and Weakness. Psychiatric Not Present- Anxiety, Bipolar, Change in Sleep Pattern, Depression, Fearful and Frequent crying. Endocrine Not Present- Cold Intolerance, Excessive Hunger, Hair Changes, Heat Intolerance, Hot flashes and New Diabetes. Hematology Present- Easy Bruising. Not Present- Excessive bleeding, Gland problems, HIV and Persistent Infections.    Physical Exam  The physical exam findings are as follows: Note:General: WDWN in NAD. Pleasant and cooperative.  HEENT: [place]/AT, no facial masses  EYES: EOMI, no icterus  CHEST: Breath sounds equal and clear. Respirations nonlabored.  BREASTS: Symmetrical in size. No dominant masses, nipple discharge or suspicious skin lesions.  ABDOMEN: Soft, nontender, nondistended, no masses, no organomegaly, RLQ scar, lower midline scar  NEUROLOGIC: Alert and oriented, answers questions appropriately, normal gait and station.  PSYCHIATRIC: Normal mood, affect , and behavior.    Assessment & Plan ) ACUTE EPIGASTRIC PAIN (789.06  R10.13) Impression: Some mild improvement of symptoms with twice a day ranitidine, but no complete resolution=>symptomatic cholelithiasis  Plan: Laparoscopic cholecystectomy. I have explained the procedure, risks, and aftercare of cholecystectomy. Risks include but are not limited to bleeding, infection, wound problems, anesthesia, diarrhea, bile leak, injury to common bile duct/liver/intestine. She seems to understand and agrees to proceed.  Jackolyn Confer, MD

## 2014-10-29 NOTE — Anesthesia Preprocedure Evaluation (Addendum)
Anesthesia Evaluation  Patient identified by MRN, date of birth, ID band Patient awake    Reviewed: Allergy & Precautions, NPO status , Patient's Chart, lab work & pertinent test results  Airway Mallampati: II  TM Distance: >3 FB Neck ROM: Full    Dental  (+) Dental Advisory Given, Edentulous Upper, Partial Lower   Pulmonary asthma , COPD COPD inhaler, former smoker,    Pulmonary exam normal       Cardiovascular + Past MI     Neuro/Psych negative neurological ROS  negative psych ROS   GI/Hepatic Neg liver ROS, hiatal hernia,   Endo/Other  negative endocrine ROS  Renal/GU negative Renal ROS     Musculoskeletal   Abdominal   Peds  Hematology   Anesthesia Other Findings   Reproductive/Obstetrics                            Anesthesia Physical Anesthesia Plan  ASA: III  Anesthesia Plan: General   Post-op Pain Management:    Induction: Intravenous  Airway Management Planned: Oral ETT  Additional Equipment:   Intra-op Plan:   Post-operative Plan: Extubation in OR  Informed Consent: I have reviewed the patients History and Physical, chart, labs and discussed the procedure including the risks, benefits and alternatives for the proposed anesthesia with the patient or authorized representative who has indicated his/her understanding and acceptance.   Dental advisory given  Plan Discussed with:   Anesthesia Plan Comments:        Anesthesia Quick Evaluation

## 2014-10-29 NOTE — Discharge Instructions (Signed)
[facility] ______[facility] LAPAROSCOPIC CHOLECYSTECTOMY SURGERY: POST OP INSTRUCTIONS Always review your discharge instruction sheet given to you by the facility where your surgery was performed. IF YOU HAVE DISABILITY OR FAMILY LEAVE FORMS, YOU MUST BRING THEM TO THE OFFICE FOR PROCESSING.   DO NOT GIVE THEM TO YOUR DOCTOR.  1. A prescription for pain medication may be given to you upon discharge.  Take your pain medication as prescribed, if needed.  If narcotic pain medicine is not needed, then you may take acetaminophen (Tylenol) or ibuprofen (Advil) as needed. 2. Take your usually prescribed medications unless otherwise directed. 3. If you need a refill on your pain medication, please contact your pharmacy.  They will contact our office to request authorization. Prescriptions will not be filled after 5pm or on week-ends. 4. You should follow a light diet the first few days after arrival home, such as soup and crackers, etc.  Be sure to include lots of fluids daily.  May start lowfat, solid foods 2 days after the surgery. 5. Most patients will experience some swelling and bruising in the area of the incisions.  Ice packs will help.  Swelling and bruising can take several days to resolve.  6. It is common to experience some constipation if taking pain medication after surgery.  Increasing fluid intake and taking a stool softener (such as Colace) will usually help or prevent this problem from occurring.  A mild laxative (Milk of Magnesia or Miralax) should be taken according to package instructions if there are no bowel movements after 48 hours. 7. Unless discharge instructions indicate otherwise, you may remove your bandages &2 hours after surgery.  You may shower the day after surgery.  You may have steri-strips (small skin tapes) in place directly over the incision.  These strips should be left on the skin until they fall off.  If your surgeon used skin glue on the incision, you may  shower in 24 hours.  The glue will flake off over the next 2-3 weeks.  Any sutures or staples will be removed at the office during your follow-up visit. 8. ACTIVITIES:  You may resume regular (light) daily activities beginning the next day--such as daily self-care, walking, climbing stairs--gradually increasing activities as tolerated.  You may have sexual intercourse when it is comfortable.  Refrain from any heavy lifting or straining for two weeks.  Do not lift anything over 10 pounds during that time.  a. You may drive when you are no longer taking prescription pain medication, you can comfortably wear a seatbelt, and you can safely maneuver your car and apply brakes. b. RETURN TO WORK:  Desk type work in 1 week, full duty work in 2 weeks if you are pain-free.________________________________________________________ 9. You should see your doctor in the office for a follow-up appointment approximately 2-3 weeks after your surgery.  Make sure that you call for this appointment within a day or two after you arrive home to insure a convenient appointment time. 10. OTHER INSTRUCTIONS: __________________________________________________________________________________________________________________________ __________________________________________________________________________________________________________________________ WHEN TO CALL YOUR DOCTOR: 1. Fever over 101.0 2. Inability to urinate 3. Continued bleeding from incision. 4. Increased pain, redness, or drainage from the incision. 5. Increasing abdominal pain  The clinic staff is available to answer your questions during regular business hours.  Please don't hesitate to call and ask to speak to one of the nurses for clinical concerns.  If you have a medical emergency, go to the nearest emergency room or call 911.  A surgeon from  [facility] is always on call at the hospital. [address], [place], [place], [place]  [address] ?  [address], [place], [place]   [address] [phone] ? [phone] ? FAX [phone] Web site: [email or web address]

## 2014-10-29 NOTE — Transfer of Care (Signed)
Immediate Anesthesia Transfer of Care Note  Patient: Sarah Pena  Procedure(s) Performed: Procedure(s): LAPAROSCOPIC CHOLECYSTECTOMY WITH INTRAOPERATIVE CHOLANGIOGRAM (N/A)  Patient Location: PACU  Anesthesia Type:General  Level of Consciousness: awake and alert   Airway & Oxygen Therapy: Patient Spontanous Breathing and Patient connected to nasal cannula oxygen  Post-op Assessment: Report given to RN and Post -op Vital signs reviewed and stable  Post vital signs: Reviewed and stable  Last Vitals:  Filed Vitals:   10/29/14 0801  BP: 141/70  Pulse: 67  Temp: 36.6 C  Resp: 18    Complications: No apparent anesthesia complications

## 2014-10-30 ENCOUNTER — Encounter (HOSPITAL_COMMUNITY): Payer: Self-pay | Admitting: General Surgery

## 2015-01-22 ENCOUNTER — Encounter: Payer: Self-pay | Admitting: Cardiovascular Disease

## 2015-02-27 ENCOUNTER — Other Ambulatory Visit: Payer: Self-pay | Admitting: Obstetrics and Gynecology

## 2015-02-27 DIAGNOSIS — Z1231 Encounter for screening mammogram for malignant neoplasm of breast: Secondary | ICD-10-CM | POA: Diagnosis not present

## 2015-02-27 DIAGNOSIS — Z6821 Body mass index (BMI) 21.0-21.9, adult: Secondary | ICD-10-CM | POA: Diagnosis not present

## 2015-02-27 DIAGNOSIS — Z779 Other contact with and (suspected) exposures hazardous to health: Secondary | ICD-10-CM | POA: Diagnosis not present

## 2015-03-03 LAB — CYTOLOGY - PAP

## 2015-03-12 ENCOUNTER — Encounter: Payer: Self-pay | Admitting: Pulmonary Disease

## 2015-03-12 ENCOUNTER — Ambulatory Visit (INDEPENDENT_AMBULATORY_CARE_PROVIDER_SITE_OTHER): Payer: Medicare PPO | Admitting: Pulmonary Disease

## 2015-03-12 VITALS — BP 128/78 | HR 75 | Ht 60.0 in | Wt 113.2 lb

## 2015-03-12 DIAGNOSIS — R911 Solitary pulmonary nodule: Secondary | ICD-10-CM

## 2015-03-12 DIAGNOSIS — J449 Chronic obstructive pulmonary disease, unspecified: Secondary | ICD-10-CM

## 2015-03-12 DIAGNOSIS — R0602 Shortness of breath: Secondary | ICD-10-CM

## 2015-03-12 HISTORY — DX: Solitary pulmonary nodule: R91.1

## 2015-03-12 NOTE — Assessment & Plan Note (Signed)
FU CT chest

## 2015-03-12 NOTE — Progress Notes (Signed)
Subjective:    Patient ID: Sarah Pena, female    DOB: 02/03/1941, 74 y.o.   MRN: [id number]  HPI  74 year old ex-smoker presents for evaluation of COPD. She smoked about 50-pack-years-about a pack per day until she quit in 05/2014. She has been told that she has COPD for many years by her PCP. She reports a second episode of pneumonia 05/2014.  She reports dyspnea on exertion-on excessive talking or while climbing the stairs in her 2 story house. She reports occasional wheezing. She denies a chronic cough. She uses albuterol 1 puff at bedtime. She underwent a laparoscopic cholecystectomy earlier this year which was uneventful She had a fall in 08/2014 and sustained a fracture of her right eighth rib-this is now healed and chest pain is resolved. She would like a parking application filled out She had a negative stress echo in 2016-immunization is otherwise up-to-date Pneumovax in 12/2003  Spirometry shows severe airway obstruction with FEV1 0.9-52%, FVC 1.4-59% and ratio of 61  CT angiogram 03/2008 -showed 8 mm lesion in the right apex Follow-up was scheduled at [facility] but she never received a call for a follow-up scan   Past Medical History  Diagnosis Date  . Cancer     Melanoma of chest wall  . COPD (chronic obstructive pulmonary disease)   . Family history of heart disease   . Myocardial infarction     "mild"  years ago  . Shortness of breath dyspnea     just recently- sob walking up stairs- "I think its pollen.  . Asthma 05/2015  . History of kidney stones   . History of hiatal hernia     'small"     Past Surgical History  Procedure Laterality Date  . Appendectomy    . Tubal ligation    . Lipoma removed  1993    RLQ area  . Hernia repair      RIH  . Melanoma excision    . Eye surgery Bilateral     catarct with lens replacement  . Cholecystectomy N/A 10/29/2014    Procedure: LAPAROSCOPIC CHOLECYSTECTOMY WITH INTRAOPERATIVE CHOLANGIOGRAM;  Surgeon: Jackolyn Confer, MD;  Location: [facility];  Service: General;  Laterality: N/A;     No Known Allergies  Social History   Social History  . Marital Status: Married    Spouse Name: N/A  . Number of Children: N/A  . Years of Education: N/A   Occupational History  . Not on file.   Social History Main Topics  . Smoking status: Former Smoker -- 1.00 packs/day for 50 years    Types: Cigarettes    Quit date: 05/22/2014  . Smokeless tobacco: Not on file     Comment: quit 05/2014  . Alcohol Use: 0.0 oz/week    0 Standard drinks or equivalent per week     Comment: rarely  . Drug Use: No  . Sexual Activity: Not on file   Other Topics Concern  . Not on file   Social History Narrative    Family History  Problem Relation Age of Onset  . Heart disease Mother   . Heart disease Father   . Cancer Maternal Grandfather      Review of Systems  Constitutional: Negative for fever, chills and unexpected weight change.  HENT: Negative for congestion, dental problem, ear pain, nosebleeds, postnasal drip, rhinorrhea, sinus pressure, sneezing, sore throat, trouble swallowing and voice change.   Eyes: Negative for visual disturbance.  Respiratory: Positive for chest  tightness, shortness of breath and wheezing. Negative for cough and choking.   Cardiovascular: Negative for chest pain and leg swelling.  Gastrointestinal: Negative for vomiting, abdominal pain and diarrhea.  Genitourinary: Negative for difficulty urinating.  Musculoskeletal: Negative for arthralgias.  Skin: Negative for rash.  Neurological: Negative for tremors, syncope and headaches.  Hematological: Does not bruise/bleed easily.       Objective:   Physical Exam  Gen. Pleasant, well-nourished, in no distress, normal affect ENT - no lesions, no post nasal drip Neck: No JVD, no thyromegaly, no carotid bruits Lungs: no use of accessory muscles, no dullness to percussion, decreased without rales or rhonchi  Cardiovascular: Rhythm  regular, heart sounds  normal, no murmurs or gallops, no peripheral edema Abdomen: soft and non-tender, no hepatosplenomegaly, BS normal. Musculoskeletal: No deformities, no cyanosis or clubbing Neuro:  alert, non focal       Assessment & Plan:

## 2015-03-12 NOTE — Assessment & Plan Note (Signed)
He does have moderate COPD-will trial Symbicort 160 twice daily If this works she will call us back for prescription. If this does not work-will use LAMA such as Spiriva She would benefit from pulmonary rehabilitation-we will enroll her in [place] possible, she cannot travel to [place] for this She will use albuterol on an as-needed basis only We discussed early signs of bronchitis

## 2015-03-12 NOTE — Patient Instructions (Addendum)
You have moderate COPD-lung capacity is at 52% Trial of Symbicort twice daily- Rinse mouth after use - call us for prescription if this works You will benefit from pulmonary rehabilitation program CT scan to follow up on nodule noted in 2009

## 2015-03-20 ENCOUNTER — Other Ambulatory Visit: Payer: Medicare PPO

## 2015-03-21 ENCOUNTER — Ambulatory Visit (INDEPENDENT_AMBULATORY_CARE_PROVIDER_SITE_OTHER)
Admission: RE | Admit: 2015-03-21 | Discharge: 2015-03-21 | Disposition: A | Discharge status: Home / Self Care | Payer: Medicare PPO | Source: Ambulatory Visit | Attending: Pulmonary Disease | Admitting: Pulmonary Disease

## 2015-03-21 DIAGNOSIS — R911 Solitary pulmonary nodule: Secondary | ICD-10-CM | POA: Diagnosis not present

## 2015-03-21 DIAGNOSIS — R918 Other nonspecific abnormal finding of lung field: Secondary | ICD-10-CM | POA: Diagnosis not present

## 2015-03-21 DIAGNOSIS — J984 Other disorders of lung: Secondary | ICD-10-CM | POA: Diagnosis not present

## 2015-03-21 DIAGNOSIS — R0602 Shortness of breath: Secondary | ICD-10-CM | POA: Diagnosis not present

## 2015-03-28 ENCOUNTER — Telehealth: Payer: Self-pay | Admitting: Pulmonary Disease

## 2015-03-28 MED ORDER — BUDESONIDE-FORMOTEROL FUMARATE 160-4.5 MCG/ACT IN AERO: 2.0000 | INHALATION_SPRAY | Freq: Two times a day (BID) | RESPIRATORY_TRACT | Status: DC

## 2015-03-28 NOTE — Telephone Encounter (Signed)
Per 03/12/15: Patient Instructions       You have moderate COPD-lung capacity is at 52% Trial of Symbicort twice daily- Rinse mouth after use - call us for prescription if this works You will benefit from pulmonary rehabilitation program CT scan to follow up on nodule noted in 2009  ----  Called spoke with pt. She reports the symbicort has worked well for her. She received symbicort 160/4.5 mcg. I have sent in to the pharm.  Nothing further needed

## 2015-05-12 ENCOUNTER — Ambulatory Visit (INDEPENDENT_AMBULATORY_CARE_PROVIDER_SITE_OTHER): Payer: Medicare PPO | Admitting: Adult Health

## 2015-05-12 ENCOUNTER — Encounter: Payer: Self-pay | Admitting: Adult Health

## 2015-05-12 VITALS — BP 106/68 | HR 85 | Temp 98.6°F | Ht 60.0 in | Wt 115.0 lb

## 2015-05-12 DIAGNOSIS — R911 Solitary pulmonary nodule: Secondary | ICD-10-CM | POA: Diagnosis not present

## 2015-05-12 DIAGNOSIS — Z23 Encounter for immunization: Secondary | ICD-10-CM

## 2015-05-12 DIAGNOSIS — J449 Chronic obstructive pulmonary disease, unspecified: Secondary | ICD-10-CM

## 2015-05-12 MED ORDER — PNEUMOCOCCAL 13-VAL CONJ VACC IM SUSP
0.5000 mL | Freq: Once | INTRAMUSCULAR | Status: AC
  Administered 2015-05-12: 0.5 mL via INTRAMUSCULAR

## 2015-05-12 NOTE — Assessment & Plan Note (Signed)
Doing well on Symbicort  Plan  Continue on Symbicort puffs twice daily, rinse after use Keep up  the good work. Remain active. Prevnar vaccine today Follow-up with Dr. Elsworth Soho in 6 months and as needed

## 2015-05-12 NOTE — Progress Notes (Signed)
   Subjective:    Patient ID: Sarah Pena, female    DOB: [birth year], 74 y.o.   MRN: [id number]  HPI 74 year old female former smoker , moderate COPD   TEST  Spirometry shows severe airway obstruction with FEV1 0.9-52%, FVC 1.4-59% and ratio of 61  CT angiogram 03/2008 -showed 8 mm lesion in the right apex   05/12/2015 follow-up COPD and lung nodule Patient returns for a month follow up. Patient was seen last visit for a COPD consult. She was started on Symbicort for moderate COPD. She does feel that her breathing has improved since starting on Symbicort. Denies any chest pain, orthopnea, PND, leg swelling, or fever. Remains active with housework, shopping. Does not exercise. Gets winded on heavy activity.  PVX (2013)  and Flu shot is utd.     Patient had a CT chest in 2009 that showed an 8 mm lung nodule in the right apex.  CT chest on 03/21/2015 showed less than 3 mm stable bilateral upper lobe groundglass nodules. Mild emphysematous changes.   Review of Systems Constitutional:   No  weight loss, night sweats,  Fevers, chills,  =fatigue, or  lassitude.  HEENT:   No headaches,  Difficulty swallowing,  Tooth/dental problems, or  Sore throat,                No sneezing, itching, ear ache, nasal congestion, post nasal drip,   CV:  No chest pain,  Orthopnea, PND, swelling in lower extremities, anasarca, dizziness, palpitations, syncope.   GI  No heartburn, indigestion, abdominal pain, nausea, vomiting, diarrhea, change in bowel habits, loss of appetite, bloody stools.   Resp:  No excess mucus, no productive cough,  No non-productive cough,  No coughing up of blood.  No change in color of mucus.  No wheezing.  No chest wall deformity  Skin: no rash or lesions.  GU: no dysuria, change in color of urine, no urgency or frequency.  No flank pain, no hematuria   MS:  No joint pain or swelling.  No decreased range of motion.  No back pain.  Psych:  No change in mood or affect. No  depression or anxiety.  No memory loss.         Objective:   Physical Exam GEN: A/Ox3; pleasant , NAD, elderly   HEENT:  [place]/AT,  EACs-clear, TMs-wnl, NOSE-clear, THROAT-clear, no lesions, no postnasal drip or exudate noted.   NECK:  Supple w/ fair ROM; no JVD; normal carotid impulses w/o bruits; no thyromegaly or nodules palpated; no lymphadenopathy.  RESP  Decreased BS in bases  w/o, wheezes/ rales/ or rhonchi.no accessory muscle use, no dullness to percussion  CARD:  RRR, no m/r/g  , no peripheral edema, pulses intact, no cyanosis or clubbing.  GI:   Soft & nt; nml bowel sounds; no organomegaly or masses detected.  Musco: Warm bil, no deformities or joint swelling noted.   Neuro: alert, no focal deficits noted.    Skin: Warm, no lesions or rashes         Assessment & Plan:

## 2015-05-12 NOTE — Assessment & Plan Note (Addendum)
Follow-up CT chest on 03/21/2015 showed less than 3 mm stable bilateral upper lobe groundglass nodules likely of benign etiology. Previous CT in 2009 showed an 8 mm lesion in the right apex  Discussed results with patient. Will discuss at return ov with Dr. Elsworth Soho  If any additional scans are needed going forward

## 2015-05-12 NOTE — Patient Instructions (Addendum)
Continue on Symbicort puffs twice daily, rinse after use Keep up  the good work. Remain active. Prevnar vaccine today Follow-up with Dr. Elsworth Soho in 6 months and as needed

## 2015-05-13 ENCOUNTER — Telehealth: Payer: Self-pay | Admitting: Pulmonary Disease

## 2015-05-13 DIAGNOSIS — J45909 Unspecified asthma, uncomplicated: Secondary | ICD-10-CM

## 2015-05-13 HISTORY — DX: Unspecified asthma, uncomplicated: J45.909

## 2015-05-13 MED ORDER — BUDESONIDE-FORMOTEROL FUMARATE 160-4.5 MCG/ACT IN AERO: 2.0000 | INHALATION_SPRAY | Freq: Two times a day (BID) | RESPIRATORY_TRACT | Status: DC

## 2015-05-13 NOTE — Telephone Encounter (Signed)
Patient calling for refill on Symbicort, was not sent to pharmacy. Rx sent to pharmacy Patient notified. Nothing further needed. Closing encounter

## 2015-05-21 NOTE — Progress Notes (Signed)
Reviewed & agree with plan  

## 2015-11-03 ENCOUNTER — Other Ambulatory Visit: Payer: Self-pay | Admitting: Physician Assistant

## 2015-11-03 DIAGNOSIS — R1010 Upper abdominal pain, unspecified: Secondary | ICD-10-CM

## 2015-11-03 DIAGNOSIS — R748 Abnormal levels of other serum enzymes: Secondary | ICD-10-CM

## 2015-11-03 DIAGNOSIS — R110 Nausea: Secondary | ICD-10-CM

## 2015-11-04 ENCOUNTER — Encounter: Payer: Self-pay | Admitting: Pulmonary Disease

## 2015-11-04 ENCOUNTER — Ambulatory Visit (INDEPENDENT_AMBULATORY_CARE_PROVIDER_SITE_OTHER): Payer: Medicare Other | Admitting: Pulmonary Disease

## 2015-11-04 VITALS — BP 118/72 | HR 86

## 2015-11-04 DIAGNOSIS — J449 Chronic obstructive pulmonary disease, unspecified: Secondary | ICD-10-CM

## 2015-11-04 DIAGNOSIS — R911 Solitary pulmonary nodule: Secondary | ICD-10-CM | POA: Diagnosis not present

## 2015-11-04 NOTE — Assessment & Plan Note (Signed)
Trial of STIOLTO instead of symbicort -call us for Rx if this works [employer] rehab referral at [facility]

## 2015-11-04 NOTE — Assessment & Plan Note (Signed)
will do 1 more FU this year

## 2015-11-04 NOTE — Patient Instructions (Signed)
Trial of STIOLTO instead of symbicort -call us for Rx if this works [employer] rehab referral at [facility]

## 2015-11-04 NOTE — Progress Notes (Signed)
   Subjective:    Patient ID: Sarah Pena, female    DOB: Aug 26, 1940, 75 y.o.   MRN: [id number]  HPI  75 year old female former smoker , moderate COPD   11/04/2015  Chief Complaint  Patient presents with  . Follow-up    pt states breathing has improved. c/o hoarseness mainly in p.m. & occ SOB X17mo    21m FU  she is compliant with Symbicort and feels that this helps her dyspnea Denies cough, wheezing or frequent chest colds No seasonal allergies She is not using her albuterol frequently  Pena reviewed her CT images from 2016 Significant tests/ events  02/2015 Spirometry - severe airway obstruction - FEV1 0.9-52%, FVC 1.4-59%, ratio of 61  CT angiogram 03/2008 -showed 8 mm lesion in the right apex CT chest 03/2015 >>Less than 3 mm stable bilateral upper lobe ground-glass nodules      Review of Systems Patient denies significant dyspnea,cough, hemoptysis,  chest pain, palpitations, pedal edema, orthopnea, paroxysmal nocturnal dyspnea, lightheadedness, nausea, vomiting, abdominal or  leg pains      Objective:   Physical Exam  Gen. Pleasant, well-nourished, in no distress ENT - no lesions, no post nasal drip Neck: No JVD, no thyromegaly, no carotid bruits Lungs: no use of accessory muscles, no dullness to percussion, clear without rales or rhonchi  Cardiovascular: Rhythm regular, heart sounds  normal, no murmurs or gallops, no peripheral edema Musculoskeletal: No deformities, no cyanosis or clubbing        Assessment & Plan:

## 2015-11-05 ENCOUNTER — Telehealth: Payer: Self-pay | Admitting: Pulmonary Disease

## 2015-11-05 NOTE — Telephone Encounter (Signed)
Last ov on 11/04/15 with RA Patient Instructions       Trial of STIOLTO instead of symbicort -call us for Rx if this works [employer] rehab referral at [facility] spoke with pt. She states that she tried to use Stiolto but states that she could not get the inhaler to work properly. She states that the inhaler had her so frustrated that she yelled at there husband. She states that she is going to stay on the Symbicort because she feels more comfortable using these inhaler. I explained to her that I would send a message to RA to make him aware. She voiced understanding and had no further questions  FYI to RA.

## 2015-11-06 ENCOUNTER — Ambulatory Visit
Admission: RE | Admit: 2015-11-06 | Discharge: 2015-11-06 | Disposition: A | Discharge status: Home / Self Care | Payer: Medicare Other | Source: Ambulatory Visit | Attending: Physician Assistant | Admitting: Physician Assistant

## 2015-11-06 DIAGNOSIS — R748 Abnormal levels of other serum enzymes: Secondary | ICD-10-CM

## 2015-11-06 DIAGNOSIS — R110 Nausea: Secondary | ICD-10-CM

## 2015-11-06 DIAGNOSIS — R1010 Upper abdominal pain, unspecified: Secondary | ICD-10-CM

## 2015-11-06 MED ORDER — IOPAMIDOL (ISOVUE-300) INJECTION 61%
100.0000 mL | Freq: Once | INTRAVENOUS | Status: AC | PRN
  Administered 2015-11-06: 100 mL via INTRAVENOUS

## 2015-12-01 ENCOUNTER — Telehealth: Payer: Self-pay | Admitting: Pulmonary Disease

## 2015-12-01 MED ORDER — TIOTROPIUM BROMIDE-OLODATEROL 2.5-2.5 MCG/ACT IN AERS: 2.0000 | INHALATION_SPRAY | Freq: Every day | RESPIRATORY_TRACT | Status: DC

## 2015-12-01 NOTE — Telephone Encounter (Signed)
Per chart notes pt was started on Stiolto at [facility] and told to call for refill if it helped. Spoke with pt and she would like refill sent to pharmacy. Rx sent. Nothing further needed.

## 2016-02-02 IMAGING — CT CT CHEST W/O CM
2 of 4 series · 15 of 36 positions shown, 18 images · IV contrast (Omnipaque 300)
Comparison: CT of the chest dated 03/26/2008

CLINICAL DATA: Follow-up of pulmonary nodules. Patient complains of
increasing shortness of breath over the last year. History of COPD.
Previous smoking history.

EXAM:
CT CHEST WITHOUT CONTRAST
TECHNIQUE: Multidetector CT imaging of the chest was performed following the
standard protocol without IV contrast.

[Series 2: chest routine with · axial · 0.57mm/px · z∈[-251,-16]mm · 12 of 57 slices shown, 15 images]
[im 5/57  mediastinal]
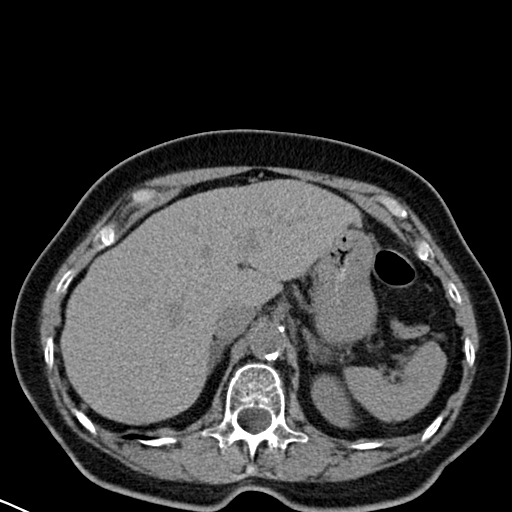
[im 5/57  lung]
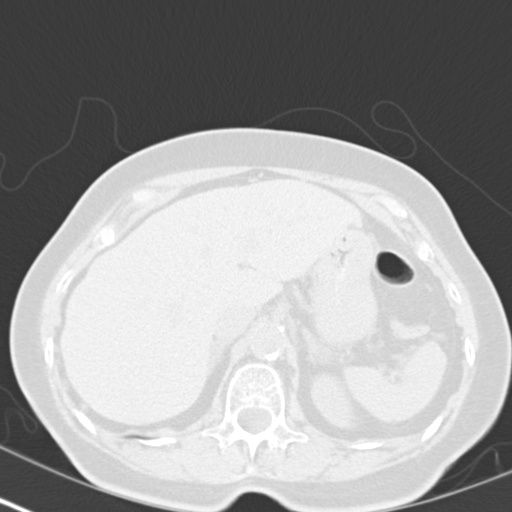
[im 9/57  lung]
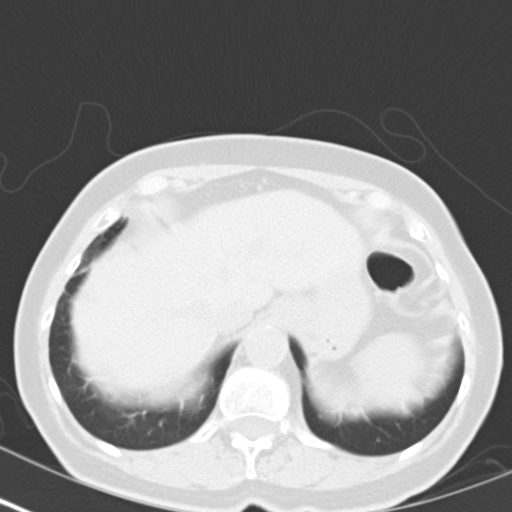
[im 13/57  lung]
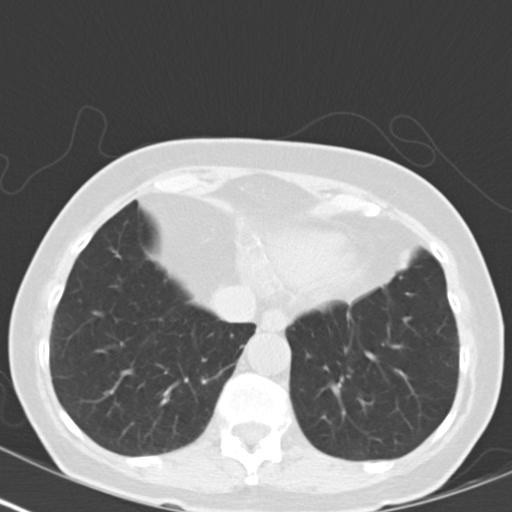
[im 18/57  lung]
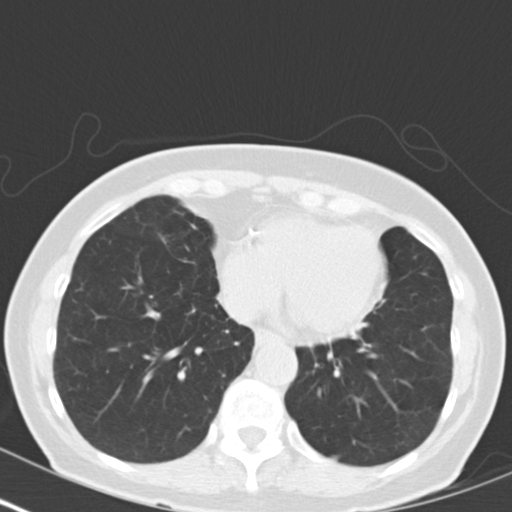
[im 22/57  mediastinal]
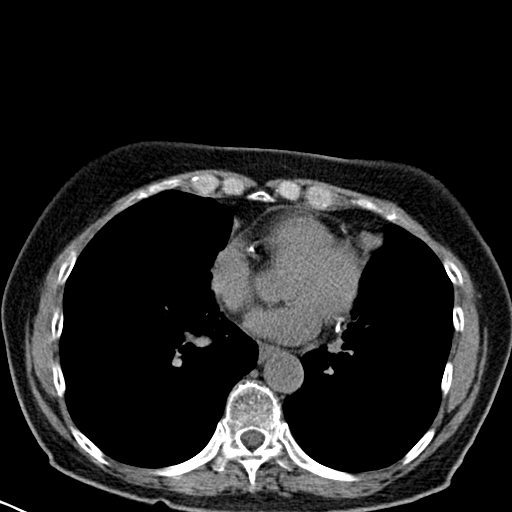
[im 22/57  lung]
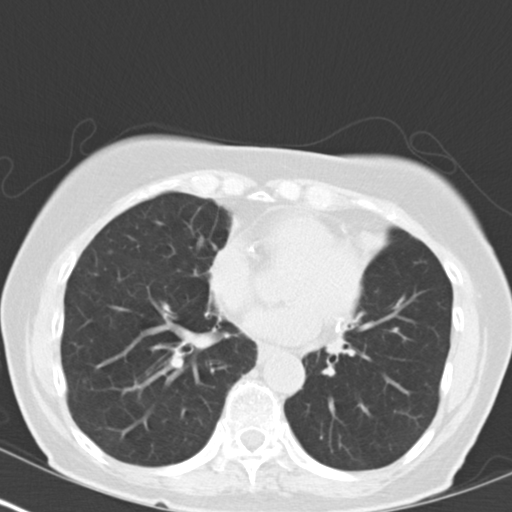
[im 26/57  lung]
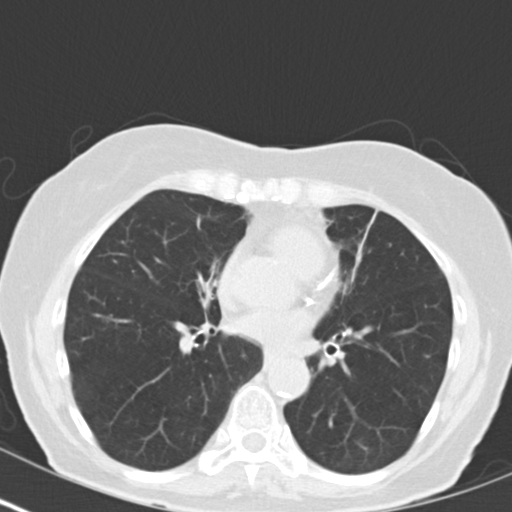
[im 31/57  lung]
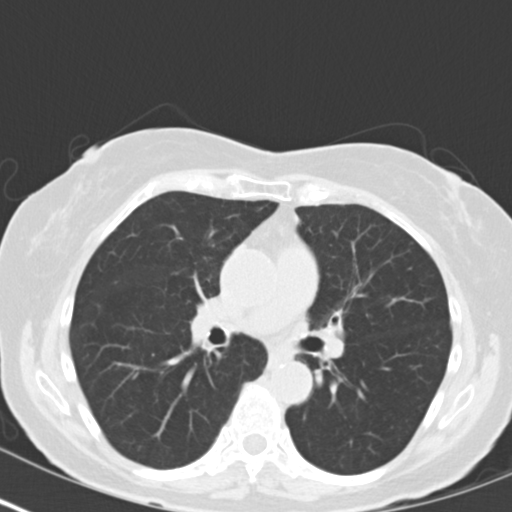
[im 35/57  lung]
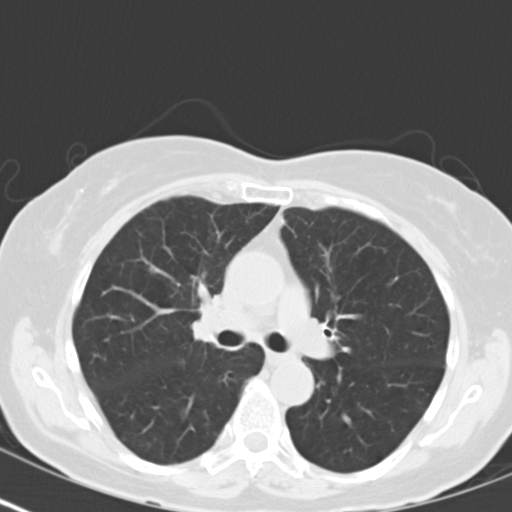
[im 39/57  mediastinal]
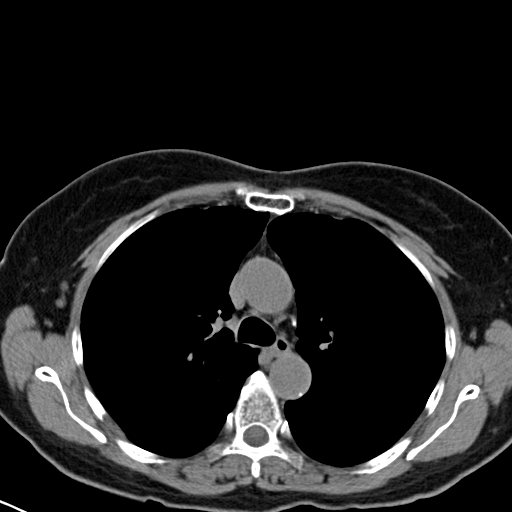
[im 39/57  lung]
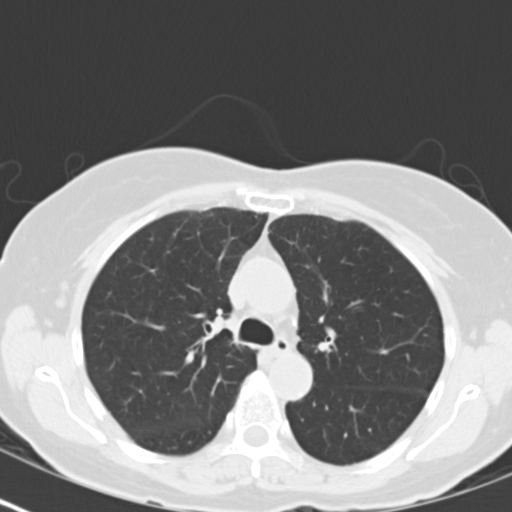
[im 44/57  lung]
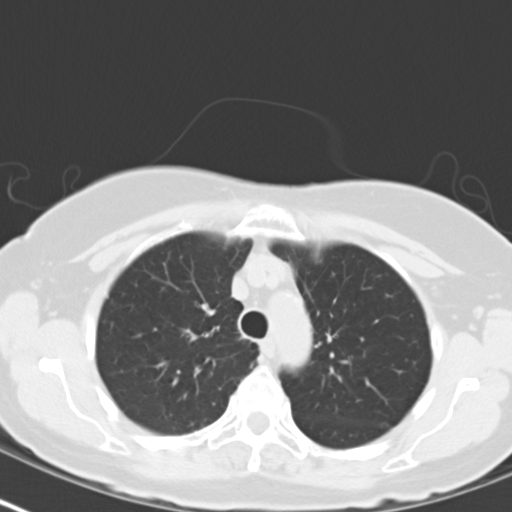
[im 48/57  lung]
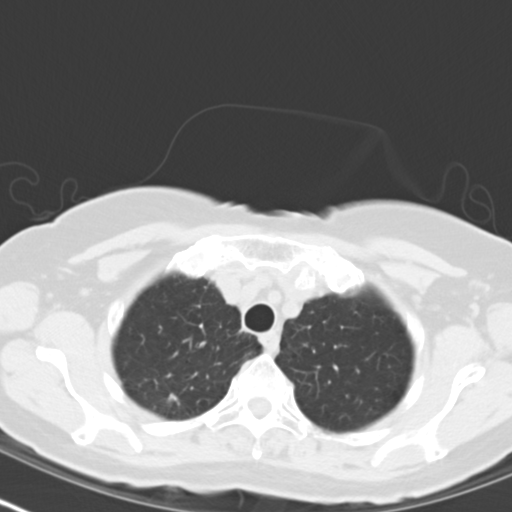
[im 52/57  lung]
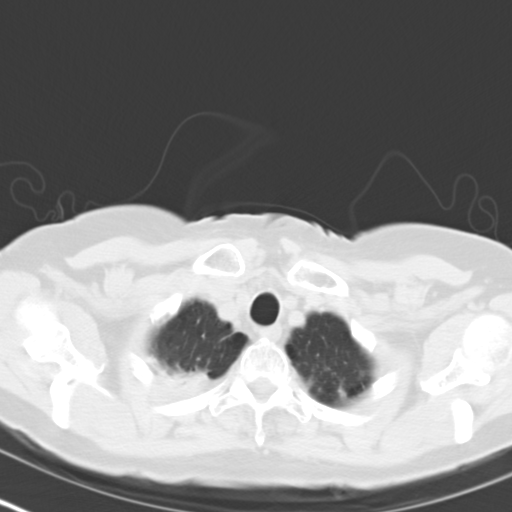

[Series 602: cor · coronal · 0.57mm/px · 3 of 98 slices shown]
[im 20/98  lung]
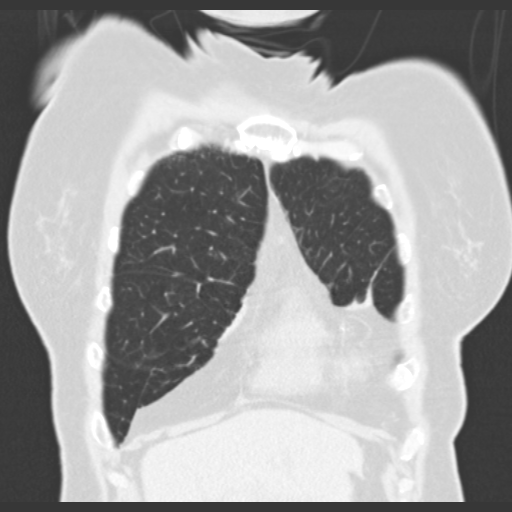
[im 39/98  lung]
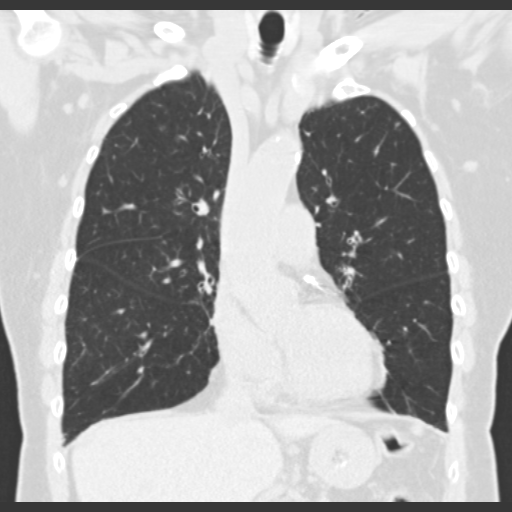
[im 59/98  lung]
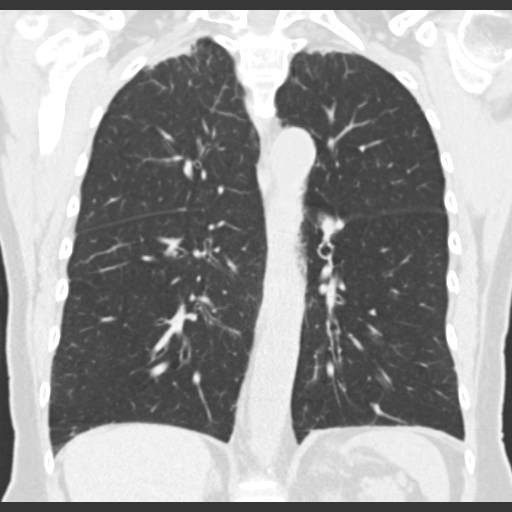

[15 of 36 positions shown; findings below may reference images not displayed]

FINDINGS: The heart and great vessels are normal in caliber. Atherosclerotic
calcifications of the aorta and coronary arteries is noted. There is
no evidence of mediastinal lymphadenopathy.

There are mild upper lobe predominant emphysematous changes. Right
apical pleural parenchymal scarring has not significantly changed
less pronounced pleural parenchymal scarring in the left apex is
also noted. There is no significant change in the peripheral
ground-glass nodules in bilateral upper lobes, measuring less than 3
mm. No new pulmonary nodules are seen. There is no evidence of
pleural effusion or pneumothorax.

No axillary lymphadenopathy is seen. The visualized portions of the
thyroid gland are unremarkable in appearance.

The visualized portions of the liver and spleen are unremarkable.

The visualized portions of the pancreas, stomach, adrenal glands and
left kidney are within normal limits. Vascular calcifications are
noted.

No acute osseous abnormalities are seen.
IMPRESSION: Stable biapical pleuro-parenchymal scarring, in the background of
mild emphysematous changes, likely sequela of smoking.

Less than 3 mm stable bilateral upper lobe ground-glass nodules. If
the patient is at high risk for bronchogenic carcinoma, follow-up
chest CT at 1 year is recommended. If the patient is at low risk, no
follow-up is needed. This recommendation follows the consensus
statement: Guidelines for Management of Small Pulmonary Nodules
Detected on CT Scans: A Statement from the [HOSPITAL] as

## 2016-02-25 ENCOUNTER — Encounter: Payer: Self-pay | Admitting: Adult Health

## 2016-02-25 ENCOUNTER — Ambulatory Visit (INDEPENDENT_AMBULATORY_CARE_PROVIDER_SITE_OTHER): Payer: Medicare Other | Admitting: Adult Health

## 2016-02-25 DIAGNOSIS — J449 Chronic obstructive pulmonary disease, unspecified: Secondary | ICD-10-CM

## 2016-02-25 DIAGNOSIS — R911 Solitary pulmonary nodule: Secondary | ICD-10-CM | POA: Diagnosis not present

## 2016-02-25 NOTE — Progress Notes (Signed)
Subjective:    Patient ID: Sarah Pena, female    DOB: [birth year], 75 y.o.   MRN: [id number]  HPI 75 year old female former smoker , moderate COPD   TEST  Spirometry shows severe airway obstruction with FEV1 0.9-52%, FVC 1.4-59% and ratio of 61  CT angiogram 03/2008 -showed 8 mm lesion in the right apex   02/25/2016 follow-up COPD and lung nodule Patient returns for a 3 month follow up. She states the stiolto works much better then [employer]  Pt c/o SOB/wheezing with exertion on occasion. Overall feels she is doing well .  Denies any sinus congestion/drainage, chest congestion/tightness, cough, fever, nausea or vomiting.  Denies any chest pain, orthopnea, PND, leg swelling, or fever. Remains active with housework, shopping. Does not exercise. Gets winded on heavy activity.  PVX (2013)  , Prevnar 2016  Referred to pulmonary rehab last ov, says she can not afford co-pays. She says she will stay active    Patient had a CT chest in 2009 that showed an 8 mm lung nodule in the right apex.  CT chest on 03/21/2015 showed less than 3 mm stable bilateral upper lobe groundglass nodules. Mild emphysematous changes.  Past Medical History:  Diagnosis Date  . Asthma 05/2015  . Cancer (HCC)    Melanoma of chest wall  . COPD (chronic obstructive pulmonary disease) ([place])   . Family history of heart disease   . History of hiatal hernia    'small"  . History of kidney stones   . Myocardial infarction ([place])    "mild"  years ago  . Shortness of breath dyspnea    just recently- sob walking up stairs- "I think its pollen.   Current Outpatient Prescriptions on File Prior to Visit  Medication Sig Dispense Refill  . albuterol (PROAIR HFA) 108 (90 BASE) MCG/ACT inhaler Inhale 2 puffs into the lungs every 6 (six) hours as needed for wheezing or shortness of breath.    . Cholecalciferol (VITAMIN D-3) 1000 UNITS CAPS Take 1 capsule by mouth daily.    . Magnesium 250 MG TABS Take 250 mg by mouth  daily.    . Tiotropium Bromide-Olodaterol (STIOLTO RESPIMAT) 2.5-2.5 MCG/ACT AERS Inhale 2 puffs into the lungs daily. 1 Inhaler 5  . Dextrose-Fructose-Sod Citrate (NAUZENE) [phone] MG CHEW Chew by mouth as needed.    . potassium chloride (K-DUR,KLOR-CON) 10 MEQ tablet Take 10 mEq by mouth once.    . ranitidine (ZANTAC) 150 MG capsule Take 150 mg by mouth 2 (two) times daily.     No current facility-administered medications on file prior to visit.      Review of Systems Constitutional:   No  weight loss, night sweats,  Fevers, chills,  +fatigue, or  lassitude.  HEENT:   No headaches,  Difficulty swallowing,  Tooth/dental problems, or  Sore throat,                No sneezing, itching, ear ache, nasal congestion, post nasal drip,   CV:  No chest pain,  Orthopnea, PND, swelling in lower extremities, anasarca, dizziness, palpitations, syncope.   GI  No heartburn, indigestion, abdominal pain, nausea, vomiting, diarrhea, change in bowel habits, loss of appetite, bloody stools.   Resp:  No excess mucus, no productive cough,  No non-productive cough,  No coughing up of blood.  No change in color of mucus.  No wheezing.  No chest wall deformity  Skin: no rash or lesions.  GU: no dysuria, change in color of  urine, no urgency or frequency.  No flank pain, no hematuria   MS:  No joint pain or swelling.  No decreased range of motion.  No back pain.  Psych:  No change in mood or affect. No depression or anxiety.  No memory loss.         Objective:   Physical Exam  Vitals:   02/25/16 0953  BP: 122/84  Pulse: 75  Temp: 98.2 F (36.8 C)  TempSrc: Oral  SpO2: 97%  Weight: 119 lb (54 kg)  Height: 5' (1.524 m)    GEN: A/Ox3; pleasant , NAD, elderly    HEENT:  [place]/AT,  EACs-clear, TMs-wnl, NOSE-clear, THROAT-clear, no lesions, no postnasal drip or exudate noted.   NECK:  Supple w/ fair ROM; no JVD; normal carotid impulses w/o bruits; no thyromegaly or nodules palpated; no  lymphadenopathy.    RESP  Decreased BS in bases  w/o, wheezes/ rales/ or rhonchi. no accessory muscle use, no dullness to percussion  CARD:  RRR, no m/r/g  , no peripheral edema, pulses intact, no cyanosis or clubbing.  GI:   Soft & nt; nml bowel sounds; no organomegaly or masses detected.   Musco: Warm bil, no deformities or joint swelling noted.   Neuro: alert, no focal deficits noted.    Skin: Warm, no lesions or rashes     [name] NP-C  [facility]  02/25/2016

## 2016-02-25 NOTE — Assessment & Plan Note (Signed)
Follow up CT chest in 3 months

## 2016-02-25 NOTE — Addendum Note (Signed)
Addended by: Osa Craver on: 02/25/2016 11:49 AM   Modules accepted: Orders

## 2016-02-25 NOTE — Assessment & Plan Note (Addendum)
Compensated without flare   Plan  Patient Instructions  Continue on Stiolto 2 puffs daily , rinse after use.  Keep up  the good work. Remain active. Set up CT chest in 3-4 months to follow lung nodule. This is to be done before visit with. Dr. Elsworth Soho .  Follow up with Dr. Elsworth Soho  In 3-4 months and As needed

## 2016-02-25 NOTE — Patient Instructions (Addendum)
Continue on Stiolto 2 puffs daily , rinse after use.  Keep up  the good work. Remain active. Set up CT chest in 3-4 months to follow lung nodule. This is to be done before visit with. Dr. Elsworth Soho .  Follow up with Dr. Elsworth Soho  In 3-4 months and As needed

## 2016-03-08 ENCOUNTER — Ambulatory Visit: Payer: Medicare Other | Admitting: Adult Health

## 2016-04-22 ENCOUNTER — Encounter: Payer: Self-pay | Admitting: Adult Health

## 2016-04-22 ENCOUNTER — Ambulatory Visit (INDEPENDENT_AMBULATORY_CARE_PROVIDER_SITE_OTHER): Payer: Medicare Other | Admitting: Adult Health

## 2016-04-22 VITALS — BP 134/78 | HR 75 | Temp 98.3°F | Ht 60.0 in | Wt 117.0 lb

## 2016-04-22 DIAGNOSIS — J449 Chronic obstructive pulmonary disease, unspecified: Secondary | ICD-10-CM

## 2016-04-22 MED ORDER — AZITHROMYCIN 250 MG PO TABS: ORAL_TABLET | ORAL | 0 refills | Status: AC

## 2016-04-22 MED ORDER — PREDNISONE 10 MG PO TABS: ORAL_TABLET | ORAL | 0 refills | Status: DC

## 2016-04-22 NOTE — Assessment & Plan Note (Signed)
Flare with URI /bronchitis   Plan  Patient Instructions  Zpack take as directed  Mucinex DM Twice daily  As needed  Cough /congestion  Prednisone taper over next week.  Follow up Dr. Elsworth Soho  As planned with CT chest next month  Please contact office for sooner follow up if symptoms do not improve or worsen or seek emergency care

## 2016-04-22 NOTE — Patient Instructions (Signed)
Zpack take as directed  Mucinex DM Twice daily  As needed  Cough /congestion  Prednisone taper over next week.  Follow up Dr. Elsworth Soho  As planned with CT chest next month  Please contact office for sooner follow up if symptoms do not improve or worsen or seek emergency care

## 2016-04-22 NOTE — Progress Notes (Signed)
Subjective:    Patient ID: Sarah Pena, female    DOB: 1940/07/28, 75 y.o.   MRN: [id number]  HPI 75 year old female former smoker , moderate COPD   TEST  Spirometry shows severe airway obstruction with FEV1 0.9-52%, FVC 1.4-59% and ratio of 61  CT angiogram 03/2008 -showed 8 mm lesion in the right apex   04/22/2016 Acute OV  COPD  Pt presents for an acute office visit.  She complains of cough , congestion and wheezing for last week.  Husband had similar symptoms and she caught it from him  She has hard time to cough up mucus.  No recent abx or travel.   PVX (2013)  , Prevnar 2016  Flu shot utd.    Patient had a CT chest in 2009 that showed an 8 mm lung nodule in the right apex.  CT chest on 03/21/2015 showed less than 3 mm stable bilateral upper lobe groundglass nodules. Mild emphysematous changes. Has upcoming CT next month.    Past Medical History:  Diagnosis Date  . Asthma 05/2015  . Cancer (HCC)    Melanoma of chest wall  . COPD (chronic obstructive pulmonary disease) ([place])   . Family history of heart disease   . History of hiatal hernia    'small"  . History of kidney stones   . Myocardial infarction    "mild"  years ago  . Shortness of breath dyspnea    just recently- sob walking up stairs- "I think its pollen.   Current Outpatient Prescriptions on File Prior to Visit  Medication Sig Dispense Refill  . albuterol (PROAIR HFA) 108 (90 BASE) MCG/ACT inhaler Inhale 2 puffs into the lungs every 6 (six) hours as needed for wheezing or shortness of breath.    . Cholecalciferol (VITAMIN D-3) 1000 UNITS CAPS Take 1 capsule by mouth daily.    Marland Kitchen Dextrose-Fructose-Sod Citrate (NAUZENE) [phone] MG CHEW Chew by mouth as needed.    . Magnesium 250 MG TABS Take 250 mg by mouth daily.    . ranitidine (ZANTAC) 150 MG capsule Take 150 mg by mouth 2 (two) times daily.    . Tiotropium Bromide-Olodaterol (STIOLTO RESPIMAT) 2.5-2.5 MCG/ACT AERS Inhale 2 puffs into the lungs  daily. 1 Inhaler 5  . potassium chloride (K-DUR,KLOR-CON) 10 MEQ tablet Take 10 mEq by mouth once.     No current facility-administered medications on file prior to visit.      Review of Systems Constitutional:   No  weight loss, night sweats,  Fevers, chills,  +fatigue, or  lassitude.  HEENT:   No headaches,  Difficulty swallowing,  Tooth/dental problems, or  Sore throat,                No sneezing, itching, ear ache,  +nasal congestion, post nasal drip,   CV:  No chest pain,  Orthopnea, PND, swelling in lower extremities, anasarca, dizziness, palpitations, syncope.   GI  No heartburn, indigestion, abdominal pain, nausea, vomiting, diarrhea, change in bowel habits, loss of appetite, bloody stools.   Resp:    No chest wall deformity  Skin: no rash or lesions.  GU: no dysuria, change in color of urine, no urgency or frequency.  No flank pain, no hematuria   MS:  No joint pain or swelling.  No decreased range of motion.  No back pain.  Psych:  No change in mood or affect. No depression or anxiety.  No memory loss.         Objective:  Physical Exam  Vitals:   04/22/16 1606  BP: 134/78  Pulse: 75  Temp: 98.3 F (36.8 C)  TempSrc: Oral  SpO2: 97%  Weight: 117 lb (53.1 kg)  Height: 5' (1.524 m)    GEN: A/Ox3; pleasant , NAD, elderly    HEENT:  [place]/AT,  EACs-clear, TMs-wnl, NOSE-clear, THROAT-clear, no lesions, no postnasal drip or exudate noted.   NECK:  Supple w/ fair ROM; no JVD; normal carotid impulses w/o bruits; no thyromegaly or nodules palpated; no lymphadenopathy.    RESP  Decreased BS in bases  w/o, wheezes/ rales/ or rhonchi. no accessory muscle use, no dullness to percussion  CARD:  RRR, no m/r/g  , no peripheral edema, pulses intact, no cyanosis or clubbing.  GI:   Soft & nt; nml bowel sounds; no organomegaly or masses detected.   Musco: Warm bil, no deformities or joint swelling noted.   Neuro: alert, no focal deficits noted.    Skin: Warm, no  lesions or rashes     Dennys Guin NP-C  [facility]  04/22/2016

## 2016-04-23 NOTE — Progress Notes (Signed)
Reviewed & agree with plan  

## 2016-05-27 ENCOUNTER — Ambulatory Visit (INDEPENDENT_AMBULATORY_CARE_PROVIDER_SITE_OTHER)
Admission: RE | Admit: 2016-05-27 | Discharge: 2016-05-27 | Disposition: A | Discharge status: Home / Self Care | Payer: Medicare Other | Source: Ambulatory Visit | Attending: Adult Health | Admitting: Adult Health

## 2016-05-27 DIAGNOSIS — R911 Solitary pulmonary nodule: Secondary | ICD-10-CM | POA: Diagnosis not present

## 2016-06-04 ENCOUNTER — Telehealth: Payer: Self-pay | Admitting: Pulmonary Disease

## 2016-06-04 NOTE — Telephone Encounter (Signed)
Notes Recorded by Melvenia Needles, NP on 05/31/2016 at 9:29 AM EST Small nodules appear throughout lungs -appears benign  Will discuss in detail at ov with Dr. Elsworth Soho In Dec  Please contact office for sooner follow up if symptoms do not improve or worsen or seek emergency care  ------- Spoke with pt, aware of results/recs.  Nothing further needed.

## 2016-06-29 ENCOUNTER — Ambulatory Visit: Payer: Medicare Other | Admitting: Pulmonary Disease

## 2016-08-02 ENCOUNTER — Telehealth: Payer: Self-pay | Admitting: Pulmonary Disease

## 2016-08-02 NOTE — Telephone Encounter (Signed)
Pt aware of RB's recommendations and voiced her understanding. Nothing further needed.

## 2016-08-02 NOTE — Telephone Encounter (Signed)
Spoke with pt, who states her husband was currently dx with the flu. Pt states she was also given a Rx for tamiflu. She is wanting to confirm that it is okay for her to take tamiflu. Pt c/o post nasal drip. Pt denies any other symptoms at this time.  RB please advise, as RA is 11p elink. Thanks.

## 2016-08-02 NOTE — Telephone Encounter (Signed)
I believe that it is okay for her to take the Tamiflu.

## 2016-08-10 ENCOUNTER — Ambulatory Visit (INDEPENDENT_AMBULATORY_CARE_PROVIDER_SITE_OTHER): Payer: Medicare Other | Admitting: Pulmonary Disease

## 2016-08-10 ENCOUNTER — Encounter: Payer: Self-pay | Admitting: Pulmonary Disease

## 2016-08-10 VITALS — BP 118/70 | HR 84 | Ht 60.0 in

## 2016-08-10 DIAGNOSIS — R911 Solitary pulmonary nodule: Secondary | ICD-10-CM | POA: Diagnosis not present

## 2016-08-10 DIAGNOSIS — J441 Chronic obstructive pulmonary disease with (acute) exacerbation: Secondary | ICD-10-CM | POA: Diagnosis not present

## 2016-08-10 NOTE — Assessment & Plan Note (Signed)
Stable-no follow-up required

## 2016-08-10 NOTE — Progress Notes (Signed)
   Subjective:    Patient ID: Sarah Pena, female    DOB: 12/29/1940, 76 y.o.   MRN: [id number]  HPI  76 year old  former smoker For follow-up of moderate COPD   08/10/2016  Chief Complaint  Patient presents with  . Follow-up    3-4 mo f/u. Breathing has been ok since last visit.    Her husband was diagnosed with the flu a week ago and she was given Tamiflu for prophylaxis which she is taking  She was switched from Symbicort to stiolto  sometime back and she feels that this works well. She takes this at night and is able to sleep better. She does use her albuterol for rescue a few times a week. She quit smoking about 2 years ago, her husband continues to smoke although he does not smoke inside the house or around her   We reviewed her CT images from 2017 -nodules are stable    Significant tests/ events  02/2015 Spirometry - severe airway obstruction - FEV1 0.9-52%, FVC 1.4-59%, ratio of 61 Spirometry 07/2016-improved FEV1 at 62%  CT angiogram 03/2008 -showed 8 mm lesion in the right apex CT chest 03/2015 >>Less than 3 mm stable bilateral upper lobe ground-glass nodules  CT chest 05/2016 - unchanged nodules    Review of Systems neg for any significant sore throat, dysphagia, itching, sneezing, nasal congestion or excess/ purulent secretions, fever, chills, sweats, unintended wt loss, pleuritic or exertional cp, hempoptysis, orthopnea pnd or change in chronic leg swelling. Also denies presyncope, palpitations, heartburn, abdominal pain, nausea, vomiting, diarrhea or change in bowel or urinary habits, dysuria,hematuria, rash, arthralgias, visual complaints, headache, numbness weakness or ataxia.     Objective:   Physical Exam  Gen. Pleasant, well-nourished, in no distress ENT - no lesions, no post nasal drip Neck: No JVD, no thyromegaly, no carotid bruits Lungs: no use of accessory muscles, no dullness to percussion, decreased bilaterally without rales or rhonchi    Cardiovascular: Rhythm regular, heart sounds  normal, no murmurs or gallops, no peripheral edema Musculoskeletal: No deformities, no cyanosis or clubbing         Assessment & Plan:

## 2016-08-10 NOTE — Patient Instructions (Addendum)
Lung function is improved to 62% Try taking stiolto in the mornings Use albuterol for rescue as needed

## 2016-08-10 NOTE — Assessment & Plan Note (Signed)
Lung function is improved to 62% Try taking stiolto in the mornings Use albuterol for rescue as needed

## 2016-10-11 ENCOUNTER — Telehealth: Payer: Self-pay | Admitting: Pulmonary Disease

## 2016-10-11 MED ORDER — TIOTROPIUM BROMIDE-OLODATEROL 2.5-2.5 MCG/ACT IN AERS
2.0000 | INHALATION_SPRAY | Freq: Every day | RESPIRATORY_TRACT | 5 refills | Status: DC
Start: 2016-10-11 — End: 2017-04-26

## 2016-10-11 NOTE — Telephone Encounter (Signed)
Spoke with pt, requesting stiolto refill to [facility].  This has been sent.  Nothing further needed.

## 2017-02-07 ENCOUNTER — Encounter: Payer: Self-pay | Admitting: Pulmonary Disease

## 2017-02-07 ENCOUNTER — Ambulatory Visit (INDEPENDENT_AMBULATORY_CARE_PROVIDER_SITE_OTHER): Payer: Medicare Other | Admitting: Pulmonary Disease

## 2017-02-07 DIAGNOSIS — R911 Solitary pulmonary nodule: Secondary | ICD-10-CM

## 2017-02-07 DIAGNOSIS — J449 Chronic obstructive pulmonary disease, unspecified: Secondary | ICD-10-CM

## 2017-02-07 MED ORDER — ALBUTEROL SULFATE HFA 108 (90 BASE) MCG/ACT IN AERS: 2.0000 | INHALATION_SPRAY | Freq: Four times a day (QID) | RESPIRATORY_TRACT | 3 refills | Status: DC | PRN

## 2017-02-07 NOTE — Progress Notes (Signed)
   Subjective:    Patient ID: Sarah Pena, female    DOB: 1940-08-17, 76 y.o.   MRN: [id number]  HPI  76 year old  former smoker For follow-up of moderate COPD   Chief Complaint  Patient presents with  . Follow-up    6 month follow up. States breathing has been great since last visit.     She quit smoking in 2016 and has been doing well since I Her husband continues to smoke.  Since her last visit, we switched taking her stiolto in the mornings and this seems to take her through the day. She hardly needs to use her albuterol much anymore  Denies cough, wheezing, intramural visits or hospitalizations   Significant tests/ events  02/2015 Spirometry - severe airway obstruction - FEV1 0.9-52%, FVC 1.4-59%, ratio of 61 Spirometry 07/2016-improved FEV1 at 62%  CT angiogram 03/2008 -showed 8 mm lesion in the right apex CT chest 03/2015 >>Less than 3 mm stable bilateral upper lobe ground-glass nodules  CT chest 05/2016 - unchanged nodules  Past Medical History:  Diagnosis Date  . Asthma 05/2015  . Cancer (HCC)    Melanoma of chest wall  . COPD (chronic obstructive pulmonary disease) ([place])   . Family history of heart disease   . History of hiatal hernia    'small"  . History of kidney stones   . Myocardial infarction ([place])    "mild"  years ago  . Shortness of breath dyspnea    just recently- sob walking up stairs- "I think its pollen.     Review of Systems Patient denies significant dyspnea,cough, hemoptysis,  chest pain, palpitations, pedal edema, orthopnea, paroxysmal nocturnal dyspnea, lightheadedness, nausea, vomiting, abdominal or  leg pains      Objective:   Physical Exam  Gen. Pleasant, well-nourished, in no distress ENT - no thrush, no post nasal drip Neck: No JVD, no thyromegaly, no carotid bruits Lungs: no use of accessory muscles, no dullness to percussion, clear without rales or rhonchi  Cardiovascular: Rhythm regular, heart sounds  normal, no  murmurs or gallops, no peripheral edema Musculoskeletal: No deformities, no cyanosis or clubbing        Assessment & Plan:

## 2017-02-07 NOTE — Assessment & Plan Note (Signed)
Continue on stiolto Immunizations are up-to-date

## 2017-02-07 NOTE — Addendum Note (Signed)
Addended by: Valerie Salts on: 02/07/2017 03:32 PM   Modules accepted: Orders

## 2017-02-07 NOTE — Patient Instructions (Signed)
Stay on stiolto  

## 2017-02-07 NOTE — Assessment & Plan Note (Signed)
Stable, no follow up required

## 2017-04-26 ENCOUNTER — Other Ambulatory Visit: Payer: Self-pay | Admitting: Pulmonary Disease

## 2017-04-29 ENCOUNTER — Ambulatory Visit (INDEPENDENT_AMBULATORY_CARE_PROVIDER_SITE_OTHER): Payer: Medicare Other | Admitting: Pulmonary Disease

## 2017-04-29 ENCOUNTER — Encounter: Payer: Self-pay | Admitting: Pulmonary Disease

## 2017-04-29 VITALS — BP 136/76 | HR 90 | Ht 60.0 in | Wt 117.0 lb

## 2017-04-29 DIAGNOSIS — J449 Chronic obstructive pulmonary disease, unspecified: Secondary | ICD-10-CM | POA: Diagnosis not present

## 2017-04-29 NOTE — Progress Notes (Signed)
Sarah Pena    [id number]    Jan 03, 1941  Primary Care Physician:Pharr, Thayer Jew, MD  Referring Physician: Deland Pretty, MD [address] [address] [place], [place] [address]  Chief complaint: Acute visit for dyspnea, chest congestion  HPI: 76 year old with moderate COPD, lung nodules maintained on stioltio.  Quit smoking 3 years ago She had an episode of acute dyspnea wheezing yesterday night with no precipitating factors clustered for a few minutes and relieved with albuterol use.  She denies any cough, sputum production, fevers, chills.  She is back to her normal self today with chronic dyspnea on exertion.  Outpatient Encounter Prescriptions as of 04/29/2017  Medication Sig  . albuterol (PROAIR HFA) 108 (90 Base) MCG/ACT inhaler Inhale 2 puffs into the lungs every 6 (six) hours as needed for wheezing or shortness of breath.  . Cholecalciferol (VITAMIN D-3) 1000 UNITS CAPS Take 1 capsule by mouth daily.  Marland Kitchen Dextrose-Fructose-Sod Citrate (NAUZENE) [phone] MG CHEW Chew by mouth as needed.  . Magnesium 250 MG TABS Take 250 mg by mouth daily.  Marland Kitchen STIOLTO RESPIMAT 2.5-2.5 MCG/ACT AERS INHALE 2 PUFFS BY MOUTH ONCE DAILY   No facility-administered encounter medications on file as of 04/29/2017.     Allergies as of 04/29/2017  . (No Known Allergies)    Past Medical History:  Diagnosis Date  . Asthma 05/2015  . Cancer (HCC)    Melanoma of chest wall  . COPD (chronic obstructive pulmonary disease) ([place])   . Family history of heart disease   . History of hiatal hernia    'small"  . History of kidney stones   . Myocardial infarction ([place])    "mild"  years ago  . Shortness of breath dyspnea    just recently- sob walking up stairs- "I think its pollen.    Past Surgical History:  Procedure Laterality Date  . APPENDECTOMY    . CHOLECYSTECTOMY N/A 10/29/2014   Procedure: LAPAROSCOPIC CHOLECYSTECTOMY WITH INTRAOPERATIVE CHOLANGIOGRAM;  Surgeon: Jackolyn Confer, MD;   Location: [facility];  Service: General;  Laterality: N/A;  . EYE SURGERY Bilateral    catarct with lens replacement  . HERNIA REPAIR     RIH  . lipoma removed  1993   RLQ area  . MELANOMA EXCISION    . TUBAL LIGATION      Family History  Problem Relation Age of Onset  . Heart disease Mother   . Heart disease Father   . Cancer Maternal Grandfather     Social History   Social History  . Marital status: Married    Spouse name: N/A  . Number of children: N/A  . Years of education: N/A   Occupational History  . Not on file.   Social History Main Topics  . Smoking status: Former Smoker    Packs/day: 1.00    Years: 50.00    Types: Cigarettes    Quit date: 05/22/2014  . Smokeless tobacco: Never Used     Comment: quit 05/2014  . Alcohol use 0.0 oz/week     Comment: rarely  . Drug use: No  . Sexual activity: Not on file   Other Topics Concern  . Not on file   Social History Narrative  . No narrative on file    Review of systems: Review of Systems  Constitutional: Negative for fever and chills.  HENT: Negative.   Eyes: Negative for blurred vision.  Respiratory: as per HPI  Cardiovascular: Negative for chest pain  and palpitations.  Gastrointestinal: Negative for vomiting, diarrhea, blood per rectum. Genitourinary: Negative for dysuria, urgency, frequency and hematuria.  Musculoskeletal: Negative for myalgias, back pain and joint pain.  Skin: Negative for itching and rash.  Neurological: Negative for dizziness, tremors, focal weakness, seizures and loss of consciousness.  Endo/Heme/Allergies: Negative for environmental allergies.  Psychiatric/Behavioral: Negative for depression, suicidal ideas and hallucinations.  All other systems reviewed and are negative.  Physical Exam: Blood pressure 136/76, pulse 90, height 5' (1.524 m), weight 117 lb (53.1 kg), SpO2 93 %. Gen:      No acute distress HEENT:  EOMI, sclera anicteric Neck:     No masses; no thyromegaly Lungs:     Clear to auscultation bilaterally; normal respiratory effort CV:         Regular rate and rhythm; no murmurs Abd:      + bowel sounds; soft, non-tender; no palpable masses, no distension Ext:    No edema; adequate peripheral perfusion Skin:      Warm and dry; no rash Neuro: alert and oriented x 3 Psych: normal mood and affect  Data Reviewed: CT chest 05/27/16-stable subcentimeter pulmonary nodules, aortic, coronary atherosclerosis, emphysema Reviewed all images personally.  Spirometry 08/10/16 FVC 1.67 [70%], FEV1 1.09 (62%], F/F 65 Moderate obstruction  Assessment:  Moderate COPD Had an episode of acute dyspnea last night.  She is stable on examination in office today with no sign of exacerbation. I not believe she requires any antibiotics or steroids Asked her to monitor symptoms and call us back if there is any change She will continue on her Stiolto and albuterol inhaler as prescribed.  Plan/Recommendations: - Continue stiolto, albuterol - Follow up in clinic as scheduled.   Marshell Garfinkel MD [facility] Pager [phone] 04/29/2017, 3:31 PM  CC: Deland Pretty, MD

## 2017-04-29 NOTE — Patient Instructions (Signed)
Your lungs sound clear on examination. I do not believe you will need any antibiotics or prednisone right now Please continue to monitor your symptoms and call us if there is any worsening Please keep your appointment with our clinic in January

## 2017-05-03 ENCOUNTER — Encounter: Payer: Self-pay | Admitting: Podiatry

## 2017-05-03 ENCOUNTER — Ambulatory Visit (INDEPENDENT_AMBULATORY_CARE_PROVIDER_SITE_OTHER): Payer: Medicare Other | Admitting: Podiatry

## 2017-05-03 ENCOUNTER — Ambulatory Visit (INDEPENDENT_AMBULATORY_CARE_PROVIDER_SITE_OTHER): Payer: Medicare Other

## 2017-05-03 DIAGNOSIS — M7661 Achilles tendinitis, right leg: Secondary | ICD-10-CM | POA: Diagnosis not present

## 2017-05-03 DIAGNOSIS — M722 Plantar fascial fibromatosis: Secondary | ICD-10-CM

## 2017-05-03 NOTE — Patient Instructions (Signed)

## 2017-05-03 NOTE — Progress Notes (Signed)
Subjective:    Patient ID: Sarah Pena, female    DOB: 1941/02/16, 76 y.o.   MRN: [id number] Chief Complaint  Patient presents with  . Foot Pain    Right heel pain    HPI 76 y.o. female presents with the above complaint.  With heel pain for several months duration.  Pain is sharp in nature, worst with periods of inactivity.  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  Denies other pedal issues  Past Medical History:  Diagnosis Date  . Asthma 05/2015  . Cancer (HCC)    Melanoma of chest wall  . COPD (chronic obstructive pulmonary disease) ([place])   . Family history of heart disease   . History of hiatal hernia    'small"  . History of kidney stones   . Myocardial infarction ([place])    "mild"  years ago  . Shortness of breath dyspnea    just recently- sob walking up stairs- "I think its pollen.   Past Surgical History:  Procedure Laterality Date  . APPENDECTOMY    . CHOLECYSTECTOMY N/A 10/29/2014   Procedure: LAPAROSCOPIC CHOLECYSTECTOMY WITH INTRAOPERATIVE CHOLANGIOGRAM;  Surgeon: Jackolyn Confer, MD;  Location: [facility];  Service: General;  Laterality: N/A;  . EYE SURGERY Bilateral    catarct with lens replacement  . HERNIA REPAIR     RIH  . lipoma removed  1993   RLQ area  . MELANOMA EXCISION    . TUBAL LIGATION      Current Outpatient Prescriptions:  .  albuterol (PROAIR HFA) 108 (90 Base) MCG/ACT inhaler, Inhale 2 puffs into the lungs every 6 (six) hours as needed for wheezing or shortness of breath., Disp: 1 Inhaler, Rfl: 3 .  aspirin 81 MG tablet, Take 81 mg by mouth daily., Disp: , Rfl:  .  Cholecalciferol (VITAMIN D-3) 1000 UNITS CAPS, Take 1 capsule by mouth daily., Disp: , Rfl:  .  Dextrose-Fructose-Sod Citrate (NAUZENE) [phone] MG CHEW, Chew by mouth as needed., Disp: , Rfl:  .  Magnesium 250 MG TABS, Take 250 mg by mouth daily., Disp: , Rfl:  .  OIL OF [place]REGANO PO, Take by mouth., Disp: , Rfl:  .  STIOLTO RESPIMAT 2.5-2.5  MCG/ACT AERS, INHALE 2 PUFFS BY MOUTH ONCE DAILY, Disp: 4 g, Rfl: 1 .  TURMERIC PO, Take by mouth., Disp: , Rfl:   No Known Allergies   Review of Systems  Constitutional: Negative.   HENT: Negative.   Eyes: Negative.   Respiratory: Negative.   Cardiovascular: Negative.   Gastrointestinal: Negative.   Endocrine: Negative.   Genitourinary: Negative.   Musculoskeletal: Positive for gait problem.  Skin: Negative.   Allergic/Immunologic: Negative.   Hematological: Negative.   Psychiatric/Behavioral: Negative.   All other systems reviewed and are negative.     Objective:   Physical Exam There were no vitals filed for this visit. General AA&O x3. Normal mood and affect.  Vascular Dorsalis pedis and posterior tibial pulses  present 2+ bilaterally  Capillary refill normal to all digits. Pedal hair growth normal.  Neurologic Epicritic sensation grossly present bilaterally.  Dermatologic No open lesions. Interspaces clear of maceration. Nails well groomed and normal in appearance.  Orthopedic: MMT 5/5 in dorsiflexion, plantarflexion, inversion, and eversion bilaterally. Tender to palpation at the calcaneal tuber bilaterally. No pain with calcaneal squeeze bilaterally. Ankle ROM diminished range of motion bilaterally. Silfverskiold Test: positive bilaterally.   Radiographs: Taken and reviewed. No acute fractures. No evidence  of calcaneal stress fracture. Small plantar and posterior calcaneal enthesophytes.    Assessment & Plan:  Patient was evaluated and treated and all questions answered  Plantar Fasciitis, right - XR reviewed as above.  - Educated on icing and stretching. Instructions given.  - Injection delivered to the plantar fascia as below. - Continue topical diclofenac.  Procedure: Injection Tendon/Ligament Location: Right plantar fascia at the glabrous junction; medial approach. Skin Prep: Alcohol. Injectate: 1 cc 0.5% marcaine plain, 1 cc dexamethasone phosphate,  0.5 cc kenalog 10. Disposition: Patient tolerated procedure well. Injection site dressed with a band-aid.

## 2017-05-24 ENCOUNTER — Ambulatory Visit: Payer: Medicare Other | Admitting: Podiatry

## 2017-05-24 DIAGNOSIS — M722 Plantar fascial fibromatosis: Secondary | ICD-10-CM | POA: Diagnosis not present

## 2017-06-05 NOTE — Progress Notes (Signed)
  Subjective:  Patient ID: Sarah Pena, female    DOB: 1941/01/08,  MRN: [id number]  Chief Complaint  Patient presents with  . Plantar Fasciitis    fu right pf   much better    76 y.o. female returns for the above complaint.  That the plantar fasciitis is much better.  Has no pain today.  Objective:  There were no vitals filed for this visit. General AA&O x3. Normal mood and affect.  Vascular Pedal pulses palpable.  Neurologic Epicritic sensation grossly intact.  Dermatologic No open lesions. Skin normal texture and turgor.  Orthopedic: No pain to palpation either foot.   Assessment & Plan:  Patient was evaluated and treated and all questions answered.  Plantar Fasciitis, right - Much improved. - No injection today. - F/u should pain recur. - Continue stretching.  Return if symptoms worsen or fail to improve, for Plantar fasciitis.

## 2017-06-25 ENCOUNTER — Other Ambulatory Visit: Payer: Self-pay | Admitting: Pulmonary Disease

## 2017-07-15 ENCOUNTER — Encounter: Payer: Self-pay | Admitting: Physician Assistant

## 2017-07-27 ENCOUNTER — Encounter: Payer: Self-pay | Admitting: *Deleted

## 2017-07-28 ENCOUNTER — Ambulatory Visit: Payer: Medicare Other | Admitting: Physician Assistant

## 2017-07-28 ENCOUNTER — Encounter: Payer: Self-pay | Admitting: Physician Assistant

## 2017-07-28 ENCOUNTER — Encounter (INDEPENDENT_AMBULATORY_CARE_PROVIDER_SITE_OTHER): Payer: Self-pay

## 2017-07-28 ENCOUNTER — Other Ambulatory Visit (INDEPENDENT_AMBULATORY_CARE_PROVIDER_SITE_OTHER): Payer: Medicare Other

## 2017-07-28 VITALS — BP 124/80 | HR 74 | Ht 60.0 in | Wt 117.4 lb

## 2017-07-28 DIAGNOSIS — R112 Nausea with vomiting, unspecified: Secondary | ICD-10-CM

## 2017-07-28 DIAGNOSIS — R1032 Left lower quadrant pain: Secondary | ICD-10-CM

## 2017-07-28 DIAGNOSIS — Z86010 Personal history of colonic polyps: Secondary | ICD-10-CM

## 2017-07-28 DIAGNOSIS — R1013 Epigastric pain: Secondary | ICD-10-CM

## 2017-07-28 DIAGNOSIS — R1031 Right lower quadrant pain: Secondary | ICD-10-CM

## 2017-07-28 DIAGNOSIS — Z1211 Encounter for screening for malignant neoplasm of colon: Secondary | ICD-10-CM

## 2017-07-28 DIAGNOSIS — G8929 Other chronic pain: Secondary | ICD-10-CM

## 2017-07-28 LAB — BASIC METABOLIC PANEL
BUN: 16 mg/dL (ref 6–23)
CHLORIDE: 102 meq/L (ref 96–112)
CO2: 27 mEq/L (ref 19–32)
CREATININE: 0.73 mg/dL (ref 0.40–1.20)
Calcium: 9.8 mg/dL (ref 8.4–10.5)
GFR: 82.5 mL/min (ref >60.00)
GLUCOSE: 93 mg/dL (ref 70–99)
POTASSIUM: 4.1 meq/L (ref 3.5–5.1)
Sodium: 138 mEq/L (ref 135–145)

## 2017-07-28 MED ORDER — DICYCLOMINE HCL 10 MG PO CAPS: ORAL_CAPSULE | ORAL | 4 refills | Status: DC

## 2017-07-28 NOTE — Progress Notes (Addendum)
Subjective:    Patient ID: Sarah Pena, female    DOB: Nov 04, 1940, 77 y.o.   MRN: [id number]  HPI Sarah Pena is a very nice 77 year old white female, new to GI today referred by Dr. Katrine Coho office for evaluation of right-sided abdominal pain. Patient has a history of COPD, she is status post cholecystectomy in 2016 for very similar pain that she is having now.. She had undergone some evaluation at that time per Dr. Penelope Coop. She's also had prior colonoscopies with Dr. Earlean Shawl.. She has copy of her last colonoscopy with her which showed prior history of adenomatous colon polyps. Colonoscopy in June 2016 with finding of sigmoid diverticulosis, she had one 6 mm polyp at the splenic flexure and  Three ,7-9 mm polyps in the sigmoid colon all removed. She was advised to have follow-up in 5 years. I do not have copy of the path report. Patient says in 2016 when her symptoms started she would have episodes of epigastric and right-sided abdominal pain. Now over the past 6-7 months she has been having almost constant daily symptoms. She says she has pain that is severe at times. She is unable to sort out any triggers and says it doesn't seem to matter what she eats. Her appetite has been okay, weight has been stable she says most of the time the pain is in her epigastrium and describes it as a hurting stabbing type of pain sometimes associated with nausea which has increased in frequency but no vomiting. She has had problems with heartburn as well. No regular NSAID use but does take baby aspirin daily. She's also had some pain in her lower abdomen intermittently. She has had 3-4 bowel movements per day usually loose stools since 2016 and does relate this back to having her cholecystectomy. No melena or hematochezia. She tried taking omeprazole without any improvement in symptoms and then torque a one-month course of Protonix 40 mg daily per her primary M.D. with no change in symptoms. Patient had labs done in  member 2018 WBC of 6.9 hemoglobin 13.9 hematocrit of 42.2 BUN 15 creatinine 0.8, LFTs within normal limits.  Review of Systems Pertinent positive and negative review of systems were noted in the above HPI section.  All other review of systems was otherwise negative.  Outpatient Encounter Medications as of 07/28/2017  Medication Sig  . albuterol (PROAIR HFA) 108 (90 Base) MCG/ACT inhaler Inhale 2 puffs into the lungs every 6 (six) hours as needed for wheezing or shortness of breath.  Marland Kitchen aspirin 81 MG tablet Take 81 mg by mouth daily.  . Cholecalciferol (VITAMIN D-3) 1000 UNITS CAPS Take 1 capsule by mouth daily.  Marland Kitchen Dextrose-Fructose-Sod Citrate (NAUZENE) [phone] MG CHEW Chew by mouth as needed.  . Magnesium 250 MG TABS Take 250 mg by mouth daily.  . OIL OF OREGANO PO Take by mouth.  . STIOLTO RESPIMAT 2.5-2.5 MCG/ACT AERS INHALE 2 PUFFS IN THE LUNGS ONCE DAILY  . TURMERIC PO Take by mouth.  . [DISCONTINUED] STIOLTO RESPIMAT 2.5-2.5 MCG/ACT AERS INHALE 2 PUFFS BY MOUTH ONCE DAILY  . dicyclomine (BENTYL) 10 MG capsule Take 1 tab twice daily as needed for abdominal pain and urgency.   No facility-administered encounter medications on file as of 07/28/2017.    No Known Allergies Patient Active Problem List   Diagnosis Date Noted  . Solitary pulmonary nodule 03/12/2015  . Chest pain 08/06/2014  . COPD (chronic obstructive pulmonary disease) ([place]) 08/06/2014   Social History   Socioeconomic History  .  Marital status: Married    Spouse name: Not on file  . Number of children: Not on file  . Years of education: Not on file  . Highest education level: Not on file  Social Needs  . Financial resource strain: Not on file  . Food insecurity - worry: Not on file  . Food insecurity - inability: Not on file  . Transportation needs - medical: Not on file  . Transportation needs - non-medical: Not on file  Occupational History  . Not on file  Tobacco Use  . Smoking status: Former Smoker     Packs/day: 1.00    Years: 50.00    Pack years: 50.00    Types: Cigarettes    Last attempt to quit: 05/22/2014    Years since quitting: 3.1  . Smokeless tobacco: Never Used  . Tobacco comment: quit 05/2014  Substance and Sexual Activity  . Alcohol use: Yes    Alcohol/week: 0.0 oz    Comment: rarely  . Drug use: No  . Sexual activity: Not on file  Other Topics Concern  . Not on file  Social History Narrative  . Not on file    Sarah Pena family history includes Cancer in her maternal grandfather; Heart disease in her father and mother.      Objective:    Vitals:   07/28/17 1048  BP: 124/80  Pulse: 74    Physical Exam  well-developed older white female in no acute distress, very pleasant blood pressure 124/80 pulse 74, height 5 foot, weight 117, BMI 22.9. HEENT; nontraumatic normocephalic EOMI PERRLA sclera anicteric, Cardiovascular ;regular rate and rhythm with S1-S2 no murmur or gallop, Pulmonary ;clear bilaterally, Abdomen; soft, disease tender in the epigastrium and hypogastrium, is no palpable mass or hepatosplenomegaly, bowel sounds are active, no guarding or rebound, Rectal; exam not done, Extremities ;no clubbing cyanosis or edema skin warm and dry, Neuropsych; mood and affect appropriate       Assessment & Plan:   #41 77 year old white female with 6-7 month history of daily epigastric and right upper quadrant abdominal pain, intermittent nausea without vomiting, and no response to PPI therapy. Patient relates similar type of pain in 2016 which culminated in cholecystectomy without improvement in symptoms.  #2 history of adenomatous colon polyps-last colonoscopy 2016 per Dr. Earlean Shawl, 4 polyps removed the largest 9 mm, indicated for 5 year interval follow-up. We will obtain path report  #3 COPD #4 remote right inguinal hernia repair #5 postcholecystectomy diarrhea-not severe  Plan; Patient will be scheduled for CT scan of the abdomen and pelvis with contrast Patient  will be scheduled for upper endoscopy with Dr. Carlean Purl. Procedure was discussed in detail with the patient including indications risks and benefits and she is agreeable to proceed. We will request copy of her path report from prior colonoscopies so she can be put in for appropriate recall Start trial of Bentyl 10 mg by mouth twice daily. Further workup pending results of above.  Eliyah Mcshea S Micheale Schlack PA-C 07/28/2017   Cc: Deland Pretty, MD   Agree with Sarah Pena assessment and plan. Gatha Mayer, MD, [facility]

## 2017-07-28 NOTE — Patient Instructions (Addendum)
Please go to the basement level to have your labs drawn.  You have been scheduled for an endoscopy. Please follow written instructions given to you at your visit today. If you use inhalers (even only as needed), please bring them with you on the day of your procedure. Your physician has requested that you go to www.startemmi.com and enter the access code given to you at your visit today. This web site gives a general overview about your procedure. However, you should still follow specific instructions given to you by our office regarding your preparation for the procedure.   You have been scheduled for a CT scan of the abdomen and pelvis at [facility] ([address]---this is in the same building as [facility]).   You are scheduled on 08-05-2017 Friday at 9:30 am. You should arrive at 9:15 am to your appointment time for registration. Please follow the written instructions below on the day of your exam:  WARNING: IF YOU ARE ALLERGIC TO IODINE/X-RAY DYE, PLEASE NOTIFY RADIOLOGY IMMEDIATELY AT [phone]! YOU WILL BE GIVEN A 13 HOUR PREMEDICATION PREP.  1) Do not eat  anything after 5:30 am (4 hours prior to your test) 2) You have been given 2 bottles of oral contrast to drink. The solution may taste               better if refrigerated, but do NOT add ice or any other liquid to this solution. Shake             well before drinking.    Drink 1 bottle of contrast @ 7:30 am (2 hours prior to your exam)  Drink 1 bottle of contrast @ 8:30 am (1 hour prior to your exam)  You may take any medications as prescribed with a small amount of water except for the following: Metformin, Glucophage, Glucovance, Avandamet, Riomet, Fortamet, Actoplus Met, Janumet, Glumetza or Metaglip. The above medications must be held the day of the exam AND 48 hours after the exam.  The purpose of you drinking the oral contrast is to aid in the visualization of your intestinal tract. The contrast  solution may cause some diarrhea. Before your exam is started, you will be given a small amount of fluid to drink. Depending on your individual set of symptoms, you may also receive an intravenous injection of x-ray contrast/dye. Plan on being at [facility] for 30 minutes or long, depending on the type of exam you are having performed.  If you have any questions regarding your exam or if you need to reschedule, you may call the CT department at [phone] between the hours of 8:00 am and 5:00 pm, Monday-Friday.  ________________________________________________________________________   If you are age 49 or older, your body mass index should be between 23-30. Your Body mass index is 22.92 kg/m. If this is out of the aforementioned range listed, please consider follow up with your Primary Care Provider.

## 2017-08-01 ENCOUNTER — Encounter: Payer: Self-pay | Admitting: Internal Medicine

## 2017-08-05 ENCOUNTER — Ambulatory Visit (INDEPENDENT_AMBULATORY_CARE_PROVIDER_SITE_OTHER)
Admission: RE | Admit: 2017-08-05 | Discharge: 2017-08-05 | Disposition: A | Discharge status: Home / Self Care | Payer: Medicare Other | Source: Ambulatory Visit | Attending: Physician Assistant | Admitting: Physician Assistant

## 2017-08-05 DIAGNOSIS — R1032 Left lower quadrant pain: Secondary | ICD-10-CM

## 2017-08-05 DIAGNOSIS — Z86010 Personal history of colonic polyps: Principal | ICD-10-CM

## 2017-08-05 DIAGNOSIS — R1013 Epigastric pain: Secondary | ICD-10-CM | POA: Diagnosis not present

## 2017-08-05 DIAGNOSIS — R112 Nausea with vomiting, unspecified: Secondary | ICD-10-CM

## 2017-08-05 DIAGNOSIS — G8929 Other chronic pain: Secondary | ICD-10-CM

## 2017-08-05 DIAGNOSIS — R1031 Right lower quadrant pain: Secondary | ICD-10-CM | POA: Diagnosis not present

## 2017-08-05 DIAGNOSIS — Z1211 Encounter for screening for malignant neoplasm of colon: Secondary | ICD-10-CM

## 2017-08-05 MED ORDER — IOPAMIDOL (ISOVUE-300) INJECTION 61%
100.0000 mL | Freq: Once | INTRAVENOUS | Status: AC | PRN
  Administered 2017-08-05: 100 mL via INTRAVENOUS

## 2017-08-09 ENCOUNTER — Other Ambulatory Visit: Payer: Self-pay

## 2017-08-09 ENCOUNTER — Encounter: Payer: Self-pay | Admitting: Internal Medicine

## 2017-08-09 ENCOUNTER — Ambulatory Visit (AMBULATORY_SURGERY_CENTER): Payer: Medicare Other | Admitting: Internal Medicine

## 2017-08-09 VITALS — BP 146/89 | HR 77 | Temp 97.3°F | Resp 12 | Ht 60.0 in | Wt 117.0 lb

## 2017-08-09 DIAGNOSIS — K2960 Other gastritis without bleeding: Secondary | ICD-10-CM

## 2017-08-09 DIAGNOSIS — R1013 Epigastric pain: Secondary | ICD-10-CM

## 2017-08-09 DIAGNOSIS — K2970 Gastritis, unspecified, without bleeding: Secondary | ICD-10-CM | POA: Diagnosis not present

## 2017-08-09 DIAGNOSIS — K2950 Unspecified chronic gastritis without bleeding: Secondary | ICD-10-CM | POA: Diagnosis not present

## 2017-08-09 MED ORDER — SODIUM CHLORIDE 0.9 % IV SOLN: 500.0000 mL | Freq: Once | INTRAVENOUS | Status: DC

## 2017-08-09 NOTE — Op Note (Signed)
[facility] Patient Name: Sarah Pena Procedure Date: 08/09/2017 3:10 PM MRN: [id number] Endoscopist: Gatha Mayer , MD Age: 77 Referring MD:  Date of Birth: 21-May-1941 Gender: Female Account #: [id number] Procedure:                Upper GI endoscopy Indications:              Epigastric abdominal pain Medicines:                Propofol per Anesthesia, Monitored Anesthesia Care Procedure:                Pre-Anesthesia Assessment:                           - Prior to the procedure, a History and Physical                            was performed, and patient medications and                            allergies were reviewed. The patient's tolerance of                            previous anesthesia was also reviewed. The risks                            and benefits of the procedure and the sedation                            options and risks were discussed with the patient.                            All questions were answered, and informed consent                            was obtained. Prior Anticoagulants: The patient has                            taken no previous anticoagulant or antiplatelet                            agents. ASA Grade Assessment: II - A patient with                            mild systemic disease. After reviewing the risks                            and benefits, the patient was deemed in                            satisfactory condition to undergo the procedure.                           After obtaining informed consent, the endoscope was  passed under direct vision. Throughout the                            procedure, the patient's blood pressure, pulse, and                            oxygen saturations were monitored continuously. The                            Endoscope was introduced through the mouth, and                            advanced to the second part of duodenum. The upper                            GI  endoscopy was accomplished without difficulty.                            The patient tolerated the procedure well. Scope In: Scope Out: Findings:                 Patchy moderate inflammation characterized by                            erythema and linear erosions was found in the                            prepyloric region of the stomach. Biopsies were                            taken with a cold forceps for histology.                           The exam was otherwise without abnormality.                           The cardia and gastric fundus were normal on                            retroflexion. Complications:            No immediate complications. Estimated Blood Loss:     Estimated blood loss was minimal. Impression:               - Gastritis. Biopsied.                           - The examination was otherwise normal. Recommendation:           - Patient has a contact number available for                            emergencies. The signs and symptoms of potential                            delayed complications were discussed with the  patient. Return to normal activities tomorrow.                            Written discharge instructions were provided to the                            patient.                           - Continue present medications.                           - Await pathology results.                           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 Gatha Mayer, MD 08/09/2017 3:25:01 PM This report has been signed electronically.

## 2017-08-09 NOTE — Patient Instructions (Signed)
Information on gastritis given.   YOU HAD AN ENDOSCOPIC PROCEDURE TODAY AT [facility]:   Refer to the procedure report that was given to you for any specific questions about what was found during the examination.  If the procedure report does not answer your questions, please call your gastroenterologist to clarify.  If you requested that your care partner not be given the details of your procedure findings, then the procedure report has been included in a sealed envelope for you to review at your convenience later.  YOU SHOULD EXPECT: Some feelings of bloating in the abdomen. Passage of more gas than usual.  Walking can help get rid of the air that was put into your GI tract during the procedure and reduce the bloating. If you had a lower endoscopy (such as a colonoscopy or flexible sigmoidoscopy) you may notice spotting of blood in your stool or on the toilet paper. If you underwent a bowel prep for your procedure, you may not have a normal bowel movement for a few days.  Please Note:  You might notice some irritation and congestion in your nose or some drainage.  This is from the oxygen used during your procedure.  There is no need for concern and it should clear up in a day or so.  SYMPTOMS TO REPORT IMMEDIATELY:     Following upper endoscopy (EGD)  Vomiting of blood or coffee ground material  New chest pain or pain under the shoulder blades  Painful or persistently difficult swallowing  New shortness of breath  Fever of 100F or higher  Black, tarry-looking stools  For urgent or emergent issues, a gastroenterologist can be reached at any hour by calling [phone].   DIET:  We do recommend a small meal at first, but then you may proceed to your regular diet.  Drink plenty of fluids but you should avoid alcoholic beverages for 24 hours.  ACTIVITY:  You should plan to take it easy for the rest of today and you should NOT DRIVE or use heavy machinery until tomorrow  (because of the sedation medicines used during the test).    FOLLOW UP: Our staff will call the number listed on your records the next business day following your procedure to check on you and address any questions or concerns that you may have regarding the information given to you following your procedure. If we do not reach you, we will leave a message.  However, if you are feeling well and you are not experiencing any problems, there is no need to return our call.  We will assume that you have returned to your regular daily activities without incident.  If any biopsies were taken you will be contacted by phone or by letter within the next 1-3 weeks.  Please call us at [phone] if you have not heard about the biopsies in 3 weeks.    SIGNATURES/CONFIDENTIALITY: You and/or your care partner have signed paperwork which will be entered into your electronic medical record.  These signatures attest to the fact that that the information above on your After Visit Summary has been reviewed and is understood.  Full responsibility of the confidentiality of this discharge information lies with you and/or your care-partner.

## 2017-08-09 NOTE — Progress Notes (Signed)
Called to room to assist during endoscopic procedure.  Patient ID and intended procedure confirmed with present staff. Received instructions for my participation in the procedure from the performing physician.  

## 2017-08-09 NOTE — Progress Notes (Signed)
A/ox3 pleased with MAC, report to Meg RN 

## 2017-08-10 ENCOUNTER — Telehealth: Payer: Self-pay | Admitting: *Deleted

## 2017-08-10 ENCOUNTER — Ambulatory Visit: Payer: Medicare Other | Admitting: Adult Health

## 2017-08-10 NOTE — Telephone Encounter (Signed)
  Follow up Call-  Call back number 08/09/2017  Post procedure Call Back phone  # [phone]  Permission to leave phone message No  comments message not set up  Some recent data might be hidden     Patient questions:  Do you have a fever, pain , or abdominal swelling? No. Pain Score  0 *  Have you tolerated food without any problems? Yes.    Have you been able to return to your normal activities? Yes.    Do you have any questions about your discharge instructions: Diet   No. Medications  No. Follow up visit  No.  Do you have questions or concerns about your Care? No.  Actions: * If pain score is 4 or above: No action needed, pain <4.

## 2017-08-11 ENCOUNTER — Ambulatory Visit (INDEPENDENT_AMBULATORY_CARE_PROVIDER_SITE_OTHER)
Admission: RE | Admit: 2017-08-11 | Discharge: 2017-08-11 | Disposition: A | Discharge status: Home / Self Care | Payer: Medicare Other | Source: Ambulatory Visit | Attending: Adult Health | Admitting: Adult Health

## 2017-08-11 ENCOUNTER — Ambulatory Visit: Payer: Medicare Other | Admitting: Adult Health

## 2017-08-11 ENCOUNTER — Encounter: Payer: Self-pay | Admitting: Adult Health

## 2017-08-11 ENCOUNTER — Ambulatory Visit: Payer: Medicare Other | Admitting: Pulmonary Disease

## 2017-08-11 VITALS — BP 112/64 | HR 82 | Ht 60.0 in | Wt 117.0 lb

## 2017-08-11 DIAGNOSIS — R0789 Other chest pain: Secondary | ICD-10-CM

## 2017-08-11 DIAGNOSIS — K219 Gastro-esophageal reflux disease without esophagitis: Secondary | ICD-10-CM | POA: Diagnosis not present

## 2017-08-11 DIAGNOSIS — J449 Chronic obstructive pulmonary disease, unspecified: Secondary | ICD-10-CM | POA: Diagnosis not present

## 2017-08-11 DIAGNOSIS — R079 Chest pain, unspecified: Secondary | ICD-10-CM

## 2017-08-11 HISTORY — DX: Gastro-esophageal reflux disease without esophagitis: K21.9

## 2017-08-11 NOTE — Addendum Note (Signed)
Addended by: Parke Poisson E on: 08/11/2017 11:21 AM   Modules accepted: Orders

## 2017-08-11 NOTE — Assessment & Plan Note (Addendum)
Atypical Chest pain/pressure x 3 weeks .  EKG was unable to be completed due to system down on 2 separate EKG machines.  Pt is not having active symptoms but unable to say if symptoms are GERD, cardiac , etc in nature  Add PPI  Will refer to cardiology for further evaluation  Advised if pain/pressure returns will need to go to ER/call 911.   Plan  Patient Instructions  Continue on Stiolto 2 puffs daily , rinse after use.  Begin Prilosec 20mg  daily before meal  GERD diet .  Refer to cardiology  Follow up with Dr. Elsworth Soho  In 4 months and As needed   Please contact office for sooner follow up if symptoms do not improve or worsen or seek emergency care

## 2017-08-11 NOTE — Assessment & Plan Note (Signed)
GERD sx  Add PPI   Plan  Patient Instructions  Continue on Stiolto 2 puffs daily , rinse after use.  Begin Prilosec 20mg  daily before meal  GERD diet .  Refer to cardiology  Follow up with Dr. Elsworth Soho  In 4 months and As needed

## 2017-08-11 NOTE — Assessment & Plan Note (Signed)
Compensated on Stiolto  Chest xray today .   Plan  Patient Instructions  Continue on Stiolto 2 puffs daily , rinse after use.  Begin Prilosec 20mg  daily before meal  GERD diet .  Refer to cardiology  Follow up with Dr. Elsworth Soho  In 4 months and As needed

## 2017-08-11 NOTE — Progress Notes (Signed)
@Patient  ID: Sarah Pena, female    DOB: 21-Aug-1940, 77 y.o.   MRN: [id number]  Chief Complaint  Patient presents with  . Follow-up    COPD    Referring provider: Deland Pretty, MD  HPI: 77 year old female former smoker , moderate COPD   TEST  Spirometry shows severe airway obstruction with FEV1 0.9-52%, FVC 1.4-59% and ratio of 61  CT angiogram 03/2008 -showed 8 mm lesion in the right apex CT chest 05/2016 CT chest small pulm nodules unchanged , largest 5 mm , appear benign .   08/11/2017 Follow up : COPD  Patient returns for a 24-month follow-up.  She says overall that her breathing is doing okay.  She denies any flare of cough,  Wheezing,  or shortness of breath.  She remains on STIOLTO daily .   Does complain over last 3 weeks of intermittent mid chest pressure that last for few minute intervals. Unable to predict , happen with activity and rest . No radiating pains, or associated diaphoresis or dyspnea.  She is followed by Cardiology , Dr. Gwenlyn Found . Has upcoming ov in March 2019.  Denies any pain or pressure currently .   Does have reflux . Had upper endo this week, showed gastritis . bx pending . On bentyl.   Under a lot of stress , Brother had massive stroke . Very stressful time for her family .     No Known Allergies  Immunization History  Administered Date(s) Administered  . Influenza, High Dose Seasonal PF 04/11/2017  . Influenza,inj,Quad PF,6+ Mos 03/13/2015, 03/26/2016  . Pneumococcal Conjugate-13 05/12/2015    Past Medical History:  Diagnosis Date  . Asthma 05/2015  . Cancer (HCC)    Melanoma of chest wall  . COPD (chronic obstructive pulmonary disease) ([place])   . Family history of heart disease   . History of hiatal hernia    'small"  . History of kidney stones   . Myocardial infarction ([place])    "mild"  years ago  . Shortness of breath dyspnea    just recently- sob walking up stairs- "I think its pollen.    Tobacco History: Social History     Tobacco Use  Smoking Status Former Smoker  . Packs/day: 1.00  . Years: 50.00  . Pack years: 50.00  . Types: Cigarettes  . Last attempt to quit: 05/22/2014  . Years since quitting: 3.2  Smokeless Tobacco Never Used  Tobacco Comment   quit 05/2014   Counseling given: Not Answered Comment: quit 05/2014   Outpatient Encounter Medications as of 08/11/2017  Medication Sig  . albuterol (PROAIR HFA) 108 (90 Base) MCG/ACT inhaler Inhale 2 puffs into the lungs every 6 (six) hours as needed for wheezing or shortness of breath.  . dicyclomine (BENTYL) 10 MG capsule Take 1 tab twice daily as needed for abdominal pain and urgency.  . Magnesium 250 MG TABS Take 250 mg by mouth daily.  Marland Kitchen STIOLTO RESPIMAT 2.5-2.5 MCG/ACT AERS INHALE 2 PUFFS IN THE LUNGS ONCE DAILY  . aspirin 81 MG tablet Take 81 mg by mouth daily.  . Cholecalciferol (VITAMIN D-3) 1000 UNITS CAPS Take 1 capsule by mouth daily.  Marland Kitchen Dextrose-Fructose-Sod Citrate (NAUZENE) [phone] MG CHEW Chew by mouth as needed.  . OIL OF OREGANO PO Take by mouth.  . TURMERIC PO Take by mouth.   Facility-Administered Encounter Medications as of 08/11/2017  Medication  . 0.9 %  sodium chloride infusion  . 0.9 %  sodium chloride infusion  .  0.9 %  sodium chloride infusion     Review of Systems  Constitutional:   No  weight loss, night sweats,  Fevers, chills, fatigue, or  lassitude.  HEENT:   No headaches,  Difficulty swallowing,  Tooth/dental problems, or  Sore throat,                No sneezing, itching, ear ache, nasal congestion, post nasal drip,   CV:  No chest pain,  Orthopnea, PND, swelling in lower extremities, anasarca, dizziness, palpitations, syncope.   GI  No nausea, vomiting, diarrhea, change in bowel habits, loss of appetite, bloody stools.   Resp: No shortness of breath with exertion or at rest.  No excess mucus, no productive cough,  No non-productive cough,  No coughing up of blood.  No change in color of mucus.  No  wheezing.  No chest wall deformity  Skin: no rash or lesions.  GU: no dysuria, change in color of urine, no urgency or frequency.  No flank pain, no hematuria   MS:  No joint pain or swelling.  No decreased range of motion.  No back pain.    Physical Exam  BP 112/64 (BP Location: Left Arm, Cuff Size: Normal)   Pulse 82   Ht 5' (1.524 m)   Wt 117 lb (53.1 kg)   SpO2 96%   BMI 22.85 kg/m   GEN: A/Ox3; pleasant , NAD, thin    HEENT:  [place]/AT,  EACs-clear, TMs-wnl, NOSE-clear, THROAT-clear, no lesions, no postnasal drip or exudate noted.   NECK:  Supple w/ fair ROM; no JVD; normal carotid impulses w/o bruits; no thyromegaly or nodules palpated; no lymphadenopathy.    RESP  Clear  P & A; w/o, wheezes/ rales/ or rhonchi. no accessory muscle use, no dullness to percussion  CARD:  RRR, no m/r/g, no peripheral edema, pulses intact, no cyanosis or clubbing.  GI:   Soft & nt; nml bowel sounds; no organomegaly or masses detected.  No guarding   Musco: Warm bil, no deformities or joint swelling noted.   Neuro: alert, no focal deficits noted.    Skin: Warm, no lesions or rashes   EKG : unable to complete, tried 2 machines but system was down  Denies active symptoms .    Lab Results:  CBC    Component Value Date/Time   WBC 5.7 10/29/2014 0802   RBC 4.40 10/29/2014 0802   HGB 13.0 10/29/2014 0802   HCT 40.4 10/29/2014 0802   PLT 219 10/29/2014 0802   MCV 91.8 10/29/2014 0802   MCH 29.5 10/29/2014 0802   MCHC 32.2 10/29/2014 0802   RDW 14.1 10/29/2014 0802   LYMPHSABS 2.1 10/29/2014 0802   MONOABS 0.6 10/29/2014 0802   EOSABS 0.5 10/29/2014 0802   BASOSABS 0.1 10/29/2014 0802    BMET    Component Value Date/Time   NA 138 07/28/2017 1155   K 4.1 07/28/2017 1155   CL 102 07/28/2017 1155   CO2 27 07/28/2017 1155   GLUCOSE 93 07/28/2017 1155   BUN 16 07/28/2017 1155   CREATININE 0.73 07/28/2017 1155   CALCIUM 9.8 07/28/2017 1155   GFRNONAA 85 (L) 10/29/2014 0802    GFRAA >90 10/29/2014 0802    BNP No results found for: BNP  ProBNP No results found for: PROBNP  Imaging: Ct Abdomen Pelvis W Contrast  Result Date: 08/05/2017 CLINICAL DATA:  77 year old female with epigastric and right abdominal pain for 6 months. Nausea. EXAM: CT ABDOMEN AND PELVIS WITH CONTRAST  TECHNIQUE: Multidetector CT imaging of the abdomen and pelvis was performed using the standard protocol following bolus administration of intravenous contrast. CONTRAST:  123mL ISOVUE-300 IOPAMIDOL (ISOVUE-300) INJECTION 61% COMPARISON:  11/06/2015 CT FINDINGS: Lower chest: No acute abnormality Hepatobiliary: The liver is unremarkable. No biliary dilatation. Patient is status post cholecystectomy. Pancreas: Unremarkable Spleen: Unremarkable Adrenals/Urinary Tract: The kidneys, adrenal glands and bladder are unremarkable except for a small right adrenal adenoma and tiny probable bilateral renal cysts. Stomach/Bowel: There is no evidence of bowel obstruction, bowel wall thickening or inflammatory changes. Vascular/Lymphatic: Aortic atherosclerosis. No enlarged abdominal or pelvic lymph nodes. Reproductive: Status post hysterectomy. No adnexal masses. Other: No ascites, focal collection or pneumoperitoneum. Musculoskeletal: No acute abnormality or suspicious bony lesion. Mild to moderate degenerative changes in the lower lumbar spine again noted. IMPRESSION: 1. No acute abnormalities or findings to suggest a cause for this patient's abdominal pain. 2.  Aortic Atherosclerosis (ICD10-I70.0). Electronically Signed   By: Margarette Canada M.D.   On: 08/05/2017 13:44     Assessment & Plan:   COPD (chronic obstructive pulmonary disease) Compensated on Stiolto  Chest xray today .   Plan  Patient Instructions  Continue on Stiolto 2 puffs daily , rinse after use.  Begin Prilosec 20mg  daily before meal  GERD diet .  Refer to cardiology  Follow up with Dr. Elsworth Soho  In 4 months and As needed           Chest  pain Atypical Chest pain/pressure x 3 weeks .  EKG was unable to be completed due to system down on 2 separate EKG machines.  Pt is not having active symptoms but unable to say if symptoms are GERD, cardiac , etc in nature  Add PPI  Will refer to cardiology for further evaluation  Advised if pain/pressure returns will need to go to ER/call 911.   Plan  Patient Instructions  Continue on Stiolto 2 puffs daily , rinse after use.  Begin Prilosec 20mg  daily before meal  GERD diet .  Refer to cardiology  Follow up with Dr. Elsworth Soho  In 4 months and As needed   Please contact office for sooner follow up if symptoms do not improve or worsen or seek emergency care           GERD (gastroesophageal reflux disease) GERD sx  Add PPI   Plan  Patient Instructions  Continue on Stiolto 2 puffs daily , rinse after use.  Begin Prilosec 20mg  daily before meal  GERD diet .  Refer to cardiology  Follow up with Dr. Elsworth Soho  In 4 months and As needed              Rexene Edison, NP 08/11/2017

## 2017-08-11 NOTE — Patient Instructions (Addendum)
Continue on Stiolto 2 puffs daily , rinse after use.  Begin Prilosec 20mg  daily before meal  GERD diet .  Refer to cardiology  Follow up with Dr. Elsworth Soho  In 4 months and As needed   Please contact office for sooner follow up if symptoms do not improve or worsen or seek emergency care

## 2017-08-12 ENCOUNTER — Other Ambulatory Visit: Payer: Self-pay

## 2017-08-12 MED ORDER — PANTOPRAZOLE SODIUM 20 MG PO TBEC: 20.0000 mg | DELAYED_RELEASE_TABLET | Freq: Every day | ORAL | 3 refills | Status: DC

## 2017-08-12 NOTE — Progress Notes (Signed)
[facility] no recall or letter  Office  Call - explain gastritis (probably not cause of pain) - no infection seen - could be from aspirin use - ok to stay on that but would try taking a daily PPI - pantoprazole 20 mg daily # 90 3 refill ok (I know she was on this before and did not help pain am using it to prevent future ulcers)  I think she said that the dicyclomine Amy Rxed  was helping her pain - but not sure - please clarify and if so she can stay on that and see [place] prn  Let me know if any ? Other issues

## 2017-08-16 ENCOUNTER — Encounter: Payer: Self-pay | Admitting: Cardiovascular Disease

## 2017-08-16 ENCOUNTER — Ambulatory Visit: Payer: Medicare Other | Admitting: Cardiovascular Disease

## 2017-08-16 VITALS — BP 132/90 | HR 77 | Ht 60.0 in | Wt 115.0 lb

## 2017-08-16 DIAGNOSIS — R072 Precordial pain: Secondary | ICD-10-CM | POA: Diagnosis not present

## 2017-08-16 NOTE — Progress Notes (Signed)
08/16/2017 Sarah Pena   1940/09/27  [id number]  Primary Physician Deland Pretty, MD Primary Cardiologist: [name] MD Lupe Carney, [place]  HPI:  Sarah Pena is a 77 y.o.  thin-appearing married Caucasian female patient of Dr. Thayer Jew Pharr's who I last saw in the office 08/06/14. She has a long history of tobacco abuse having discontinued this 3 years ago after being treated for pneumonia. She has a strong family history of heart disease with both parents died of heart-related issues. She had a negative stress test in our office 04/16/08. She has been relatively stable until recently when she developed new onset substernal chest pain several weeks ago which has been occurring on a daily basis and increasing in frequency and severity. She actually had an episode which prompted her to get go to the emergency room [facility] and her pain stop prior to getting married she turned around.   Current Meds  Medication Sig  . albuterol (PROAIR HFA) 108 (90 Base) MCG/ACT inhaler Inhale 2 puffs into the lungs every 6 (six) hours as needed for wheezing or shortness of breath.  Marland Kitchen aspirin 81 MG tablet Take 81 mg by mouth daily.  . Cholecalciferol (VITAMIN D-3) 1000 UNITS CAPS Take 1 capsule by mouth daily.  Marland Kitchen Dextrose-Fructose-Sod Citrate (NAUZENE) [phone] MG CHEW Chew by mouth as needed.  . dicyclomine (BENTYL) 10 MG capsule Take 1 tab twice daily as needed for abdominal pain and urgency.  . Magnesium 250 MG TABS Take 250 mg by mouth daily.  . OIL OF OREGANO PO Take by mouth.  . pantoprazole (PROTONIX) 20 MG tablet Take 1 tablet (20 mg total) by mouth daily.  Marland Kitchen STIOLTO RESPIMAT 2.5-2.5 MCG/ACT AERS INHALE 2 PUFFS IN THE LUNGS ONCE DAILY  . TURMERIC PO Take by mouth.   Current Facility-Administered Medications for the 08/16/17 encounter (Office Visit) with [name], MD  Medication  . 0.9 %  sodium chloride infusion  . 0.9 %  sodium chloride infusion  . 0.9 %  sodium  chloride infusion     No Known Allergies  Social History   Socioeconomic History  . Marital status: Married    Spouse name: Not on file  . Number of children: Not on file  . Years of education: Not on file  . Highest education level: Not on file  Social Needs  . Financial resource strain: Not on file  . Food insecurity - worry: Not on file  . Food insecurity - inability: Not on file  . Transportation needs - medical: Not on file  . Transportation needs - non-medical: Not on file  Occupational History  . Not on file  Tobacco Use  . Smoking status: Former Smoker    Packs/day: 1.00    Years: 50.00    Pack years: 50.00    Types: Cigarettes    Last attempt to quit: 05/22/2014    Years since quitting: 3.2  . Smokeless tobacco: Never Used  . Tobacco comment: quit 05/2014  Substance and Sexual Activity  . Alcohol use: Yes    Alcohol/week: 0.0 oz    Comment: rarely  . Drug use: No  . Sexual activity: Not on file  Other Topics Concern  . Not on file  Social History Narrative  . Not on file     Review of Systems: General: negative for chills, fever, night sweats or weight changes.  Cardiovascular: negative for chest pain, dyspnea on exertion, edema, orthopnea, palpitations, paroxysmal  nocturnal dyspnea or shortness of breath Dermatological: negative for rash Respiratory: negative for cough or wheezing Urologic: negative for hematuria Abdominal: negative for nausea, vomiting, diarrhea, bright red blood per rectum, melena, or hematemesis Neurologic: negative for visual changes, syncope, or dizziness All other systems reviewed and are otherwise negative except as noted above.    Blood pressure 132/90, pulse 77, height 5' (1.524 m), weight 115 lb (52.2 kg).  General appearance: alert and no distress Neck: no adenopathy, no carotid bruit, no JVD, supple, symmetrical, trachea midline and thyroid not enlarged, symmetric, no tenderness/mass/nodules Lungs: clear to auscultation  bilaterally Heart: regular rate and rhythm, S1, S2 normal, no murmur, click, rub or gallop Extremities: extremities normal, atraumatic, no cyanosis or edema Pulses: 2+ and symmetric Skin: Skin color, texture, turgor normal. No rashes or lesions Neurologic: Alert and oriented X 3, normal strength and tone. Normal symmetric reflexes. Normal coordination and gait  EKG sinus rhythm at 77 with nonspecific ST and T-wave changes a left axis deviation. I personally reviewed this EKG  ASSESSMENT AND PLAN:   Chest pain Sarah Pena presents after being seen last 3 years ago. She doesn't have a history of discontinued tobacco abuse and strong family history for heart disease although she's never had a heart attack or stroke. She's had negative stress test remotely. Over the last several weeks she's had crescendo angina which sounds worrisome. She did go to the hospital but her pain stop prior to getting that she turned around. Based on the new onset, accelerated frequency and severity I feel compelled to perform cardiac catheterization on her outpatient.The patient understands that risks included but are not limited to stroke (1 in 1000), death (1 in 69), kidney failure [usually temporary] (1 in 500), bleeding (1 in 200), allergic reaction [possibly serious] (1 in 200). The patient understands and agrees to proceed      [name] MD [facility], [facility] 08/16/2017 3:45 PM

## 2017-08-16 NOTE — H&P (View-Only) (Signed)
08/16/2017 Sarah Pena   1941-02-18  [id number]  Primary Physician Sarah Pretty, Pena Primary Cardiologist: Sarah Pena Sarah Pena, [place]  HPI:  Sarah Pena is a 76 y.o.  thin-appearing married Caucasian female patient of Sarah Pena's who I last saw in the office 08/06/14. She has a long history of tobacco abuse having discontinued this 3 years ago after being treated for pneumonia. She has a strong family history of heart disease with both parents died of heart-related issues. She had a negative stress test in our office 04/16/08. She has been relatively stable until recently when she developed new onset substernal chest pain several weeks ago which has been occurring on a daily basis and increasing in frequency and severity. She actually had an episode which prompted her to get go to the emergency room [facility] and her pain stop prior to getting married she turned around.   Current Meds  Medication Sig  . albuterol (PROAIR HFA) 108 (90 Base) MCG/ACT inhaler Inhale 2 puffs into the lungs every 6 (six) hours as needed for wheezing or shortness of breath.  Marland Kitchen aspirin 81 MG tablet Take 81 mg by mouth daily.  . Cholecalciferol (VITAMIN D-3) 1000 UNITS CAPS Take 1 capsule by mouth daily.  Marland Kitchen Dextrose-Fructose-Sod Citrate (NAUZENE) [phone] MG CHEW Chew by mouth as needed.  . dicyclomine (BENTYL) 10 MG capsule Take 1 tab twice daily as needed for abdominal pain and urgency.  . Magnesium 250 MG TABS Take 250 mg by mouth daily.  . OIL OF OREGANO PO Take by mouth.  . pantoprazole (PROTONIX) 20 MG tablet Take 1 tablet (20 mg total) by mouth daily.  Marland Kitchen STIOLTO RESPIMAT 2.5-2.5 MCG/ACT AERS INHALE 2 PUFFS IN THE LUNGS ONCE DAILY  . TURMERIC PO Take by mouth.   Current Facility-Administered Medications for the 08/16/17 encounter (Office Visit) with Sarah Harp, Pena  Medication  . 0.9 %  sodium chloride infusion  . 0.9 %  sodium chloride infusion  . 0.9 %  sodium  chloride infusion     No Known Allergies  Social History   Socioeconomic History  . Marital status: Married    Spouse name: Not on file  . Number of children: Not on file  . Years of education: Not on file  . Highest education level: Not on file  Social Needs  . Financial resource strain: Not on file  . Food insecurity - worry: Not on file  . Food insecurity - inability: Not on file  . Transportation needs - medical: Not on file  . Transportation needs - non-medical: Not on file  Occupational History  . Not on file  Tobacco Use  . Smoking status: Former Smoker    Packs/day: 1.00    Years: 50.00    Pack years: 50.00    Types: Cigarettes    Last attempt to quit: 05/22/2014    Years since quitting: 3.2  . Smokeless tobacco: Never Used  . Tobacco comment: quit 05/2014  Substance and Sexual Activity  . Alcohol use: Yes    Alcohol/week: 0.0 oz    Comment: rarely  . Drug use: No  . Sexual activity: Not on file  Other Topics Concern  . Not on file  Social History Narrative  . Not on file     Review of Systems: General: negative for chills, fever, night sweats or weight changes.  Cardiovascular: negative for chest pain, dyspnea on exertion, edema, orthopnea, palpitations, paroxysmal  nocturnal dyspnea or shortness of breath Dermatological: negative for rash Respiratory: negative for cough or wheezing Urologic: negative for hematuria Abdominal: negative for nausea, vomiting, diarrhea, bright red blood per rectum, melena, or hematemesis Neurologic: negative for visual changes, syncope, or dizziness All other systems reviewed and are otherwise negative except as noted above.    Blood pressure 132/90, pulse 77, height 5' (1.524 m), weight 115 lb (52.2 kg).  General appearance: alert and no distress Neck: no adenopathy, no carotid bruit, no JVD, supple, symmetrical, trachea midline and thyroid not enlarged, symmetric, no tenderness/mass/nodules Lungs: clear to auscultation  bilaterally Heart: regular rate and rhythm, S1, S2 normal, no murmur, click, rub or gallop Extremities: extremities normal, atraumatic, no cyanosis or edema Pulses: 2+ and symmetric Skin: Skin color, texture, turgor normal. No rashes or lesions Neurologic: Alert and oriented X 3, normal strength and tone. Normal symmetric reflexes. Normal coordination and gait  EKG sinus rhythm at 77 with nonspecific ST and T-wave changes a left axis deviation. I personally reviewed this EKG  ASSESSMENT AND PLAN:   Chest pain [name] presents after being seen last 3 years ago. She doesn't have a history of discontinued tobacco abuse and strong family history for heart disease although she's never had a heart attack or stroke. She's had negative stress test remotely. Over the last several weeks she's had crescendo angina which sounds worrisome. She did go to the hospital but her pain stop prior to getting that she turned around. Based on the new onset, accelerated frequency and severity I feel compelled to perform cardiac catheterization on her outpatient.The patient understands that risks included but are not limited to stroke (1 in 1000), death (1 in 48), kidney failure [usually temporary] (1 in 500), bleeding (1 in 200), allergic reaction [possibly serious] (1 in 200). The patient understands and agrees to proceed      Sarah Pena [facility], [facility] 08/16/2017 3:45 PM

## 2017-08-16 NOTE — Assessment & Plan Note (Signed)
Sarah Pena presents after being seen last 3 years ago. She doesn't have a history of discontinued tobacco abuse and strong family history for heart disease although she's never had a heart attack or stroke. She's had negative stress test remotely. Over the last several weeks she's had crescendo angina which sounds worrisome. She did go to the hospital but her pain stop prior to getting that she turned around. Based on the new onset, accelerated frequency and severity I feel compelled to perform cardiac catheterization on her outpatient.The patient understands that risks included but are not limited to stroke (1 in 1000), death (1 in 13), kidney failure [usually temporary] (1 in 500), bleeding (1 in 200), allergic reaction [possibly serious] (1 in 200). The patient understands and agrees to proceed

## 2017-08-16 NOTE — Patient Instructions (Signed)
   [facility] [address] [place] [address] Dept: [phone] Loc: [place]  08/16/2017  You are scheduled for a Cardiac Catheterization on Thursday, February 7 with Dr. Quay Burow.  1. Please arrive at the [facility] (Main Entrance A) at [facility]: [place],  [address] at 1:30 PM (two hours before your procedure to ensure your preparation). Free valet parking service is available.   Special note: Every effort is made to have your procedure done on time. Please understand that emergencies sometimes delay scheduled procedures.  2. Diet: Do not eat or drink anything after midnight prior to your procedure except sips of water to take medications.  3. Labs:  Please have labs drawn in our office today.  4. Medication instructions in preparation for your procedure:  On the morning of your procedure, take your Aspirin and any morning medicines NOT listed above.  You may use sips of water.  5. Plan for one night stay--bring personal belongings. 6. Bring a current list of your medications and current insurance cards. 7. You MUST have a responsible person to drive you home. 8. Someone MUST be with you the first 24 hours after you arrive home or your discharge will be delayed. 9. Please wear clothes that are easy to get on and off and wear slip-on shoes.  Thank you for allowing [place] to care for you!   -- [facility]  Post-procedure Follow-up:  Your physician recommends that you schedule a follow-up appointment 2 weeks after cath with Dr. Gwenlyn Found.

## 2017-08-17 ENCOUNTER — Telehealth: Payer: Self-pay | Admitting: *Deleted

## 2017-08-17 LAB — BASIC METABOLIC PANEL
BUN/Creatinine Ratio: 16 (ref 12–28)
BUN: 12 mg/dL (ref 8–27)
CALCIUM: 10.2 mg/dL (ref 8.7–10.3)
CO2: 23 mmol/L (ref 20–29)
CREATININE: 0.74 mg/dL (ref 0.57–1.00)
Chloride: 102 mmol/L (ref 96–106)
GFR calc Af Amer: 92 mL/min/{1.73_m2} (ref >59)
GFR, EST NON AFRICAN AMERICAN: 80 mL/min/{1.73_m2} (ref >59)
GLUCOSE: 95 mg/dL (ref 65–99)
POTASSIUM: 4.6 mmol/L (ref 3.5–5.2)
SODIUM: 141 mmol/L (ref 134–144)

## 2017-08-17 LAB — CBC WITH DIFFERENTIAL/PLATELET
BASOS: 1 %
Basophils Absolute: 0 10*3/uL (ref 0.0–0.2)
EOS (ABSOLUTE): 0.5 10*3/uL — AB (ref 0.0–0.4)
Eos: 5 %
Hematocrit: 41.4 % (ref 34.0–46.6)
Hemoglobin: 13.6 g/dL (ref 11.1–15.9)
IMMATURE GRANS (ABS): 0 10*3/uL (ref 0.0–0.1)
IMMATURE GRANULOCYTES: 0 %
LYMPHS: 32 %
Lymphocytes Absolute: 2.8 10*3/uL (ref 0.7–3.1)
MCH: 29.7 pg (ref 26.6–33.0)
MCHC: 32.9 g/dL (ref 31.5–35.7)
MCV: 90 fL (ref 79–97)
MONOS ABS: 1 10*3/uL — AB (ref 0.1–0.9)
Monocytes: 11 %
NEUTROS PCT: 51 %
Neutrophils Absolute: 4.5 10*3/uL (ref 1.4–7.0)
PLATELETS: 273 10*3/uL (ref 150–379)
RBC: 4.58 x10E6/uL (ref 3.77–5.28)
RDW: 14.3 % (ref 12.3–15.4)
WBC: 8.8 10*3/uL (ref 3.4–10.8)

## 2017-08-17 LAB — TSH: TSH: 2.8 u[IU]/mL (ref 0.450–4.500)

## 2017-08-17 LAB — PROTIME-INR
INR: 1 (ref 0.8–1.2)
PROTHROMBIN TIME: 10.4 s (ref 9.1–12.0)

## 2017-08-17 LAB — APTT: aPTT: 25 s (ref 24–33)

## 2017-08-17 NOTE — Telephone Encounter (Signed)
Pt contacted pre-catheterization at [facility] for: Thursday August 18, 2017 3:30 PM Verified arrival time and place: [place] Entrance A/[place] at: 1:30 PM Verified no allergies. Verified no diabetes.   Verified AM meds can be  taken pre-cath with sip of water including: ASA 81 mg am of  Confirmed patient has responsible person to drive home post procedure and observe patient for 24 hours: yes

## 2017-08-18 ENCOUNTER — Inpatient Hospital Stay (HOSPITAL_COMMUNITY): Payer: Medicare Other | Admitting: Anesthesiology

## 2017-08-18 ENCOUNTER — Inpatient Hospital Stay (HOSPITAL_COMMUNITY)
Admission: AD | Disposition: A | Discharge status: Home / Self Care | Payer: Self-pay | Source: Ambulatory Visit | Attending: Thoracic Surgery (Cardiothoracic Vascular Surgery)

## 2017-08-18 ENCOUNTER — Inpatient Hospital Stay (HOSPITAL_COMMUNITY)
Admission: AD | Admit: 2017-08-18 | Discharge: 2017-08-24 | DRG: 234 | Disposition: A | Discharge status: Home / Self Care | Payer: Medicare Other | Source: Ambulatory Visit | Attending: Thoracic Surgery (Cardiothoracic Vascular Surgery) | Admitting: Thoracic Surgery (Cardiothoracic Vascular Surgery)

## 2017-08-18 ENCOUNTER — Inpatient Hospital Stay (HOSPITAL_COMMUNITY): Payer: Medicare Other

## 2017-08-18 ENCOUNTER — Encounter (HOSPITAL_COMMUNITY)
Admission: AD | Disposition: A | Discharge status: Home / Self Care | Payer: Self-pay | Source: Ambulatory Visit | Attending: Thoracic Surgery (Cardiothoracic Vascular Surgery)

## 2017-08-18 DIAGNOSIS — I25110 Atherosclerotic heart disease of native coronary artery with unstable angina pectoris: Secondary | ICD-10-CM | POA: Diagnosis not present

## 2017-08-18 DIAGNOSIS — I2510 Atherosclerotic heart disease of native coronary artery without angina pectoris: Secondary | ICD-10-CM

## 2017-08-18 DIAGNOSIS — Z85820 Personal history of malignant melanoma of skin: Secondary | ICD-10-CM

## 2017-08-18 DIAGNOSIS — Z79899 Other long term (current) drug therapy: Secondary | ICD-10-CM | POA: Diagnosis not present

## 2017-08-18 DIAGNOSIS — R911 Solitary pulmonary nodule: Secondary | ICD-10-CM | POA: Diagnosis present

## 2017-08-18 DIAGNOSIS — Z09 Encounter for follow-up examination after completed treatment for conditions other than malignant neoplasm: Secondary | ICD-10-CM

## 2017-08-18 DIAGNOSIS — R079 Chest pain, unspecified: Secondary | ICD-10-CM | POA: Diagnosis present

## 2017-08-18 DIAGNOSIS — I252 Old myocardial infarction: Secondary | ICD-10-CM | POA: Diagnosis not present

## 2017-08-18 DIAGNOSIS — C439 Malignant melanoma of skin, unspecified: Secondary | ICD-10-CM | POA: Diagnosis present

## 2017-08-18 DIAGNOSIS — Z87442 Personal history of urinary calculi: Secondary | ICD-10-CM

## 2017-08-18 DIAGNOSIS — Z87891 Personal history of nicotine dependence: Secondary | ICD-10-CM | POA: Diagnosis not present

## 2017-08-18 DIAGNOSIS — Z951 Presence of aortocoronary bypass graft: Secondary | ICD-10-CM

## 2017-08-18 DIAGNOSIS — R1013 Epigastric pain: Secondary | ICD-10-CM

## 2017-08-18 DIAGNOSIS — E8770 Fluid overload, unspecified: Secondary | ICD-10-CM | POA: Diagnosis present

## 2017-08-18 DIAGNOSIS — I454 Nonspecific intraventricular block: Secondary | ICD-10-CM | POA: Diagnosis present

## 2017-08-18 DIAGNOSIS — K219 Gastro-esophageal reflux disease without esophagitis: Secondary | ICD-10-CM | POA: Diagnosis present

## 2017-08-18 DIAGNOSIS — R000 Tachycardia, unspecified: Secondary | ICD-10-CM | POA: Diagnosis present

## 2017-08-18 DIAGNOSIS — J811 Chronic pulmonary edema: Secondary | ICD-10-CM | POA: Diagnosis present

## 2017-08-18 DIAGNOSIS — J449 Chronic obstructive pulmonary disease, unspecified: Secondary | ICD-10-CM | POA: Diagnosis present

## 2017-08-18 DIAGNOSIS — Z8249 Family history of ischemic heart disease and other diseases of the circulatory system: Secondary | ICD-10-CM

## 2017-08-18 DIAGNOSIS — I493 Ventricular premature depolarization: Secondary | ICD-10-CM | POA: Diagnosis present

## 2017-08-18 HISTORY — PX: TEE WITHOUT CARDIOVERSION: SHX5443

## 2017-08-18 HISTORY — PX: CORONARY ARTERY BYPASS GRAFT: SHX141

## 2017-08-18 HISTORY — PX: LEFT HEART CATH AND CORONARY ANGIOGRAPHY: CATH118249

## 2017-08-18 HISTORY — DX: Atherosclerotic heart disease of native coronary artery without angina pectoris: I25.10

## 2017-08-18 LAB — HEMOGLOBIN AND HEMATOCRIT, BLOOD
HCT: 27.3 % — ABNORMAL LOW (ref 36.0–46.0)
HEMOGLOBIN: 9.1 g/dL — AB (ref 12.0–15.0)

## 2017-08-18 LAB — PLATELET COUNT: Platelets: 121 10*3/uL — ABNORMAL LOW (ref 150–400)

## 2017-08-18 LAB — ABO/RH: ABO/RH(D): B POS

## 2017-08-18 LAB — PREPARE RBC (CROSSMATCH)

## 2017-08-18 SURGERY — LEFT HEART CATH AND CORONARY ANGIOGRAPHY
Anesthesia: LOCAL

## 2017-08-18 SURGERY — CORONARY ARTERY BYPASS GRAFTING (CABG)
Anesthesia: General | Site: Chest

## 2017-08-18 MED ORDER — SODIUM CHLORIDE 0.9 % IV SOLN: Freq: Once | INTRAVENOUS | Status: DC

## 2017-08-18 MED ORDER — METOPROLOL TARTRATE 25 MG PO TABS: 12.5000 mg | ORAL_TABLET | Freq: Two times a day (BID) | ORAL | 0 refills | Status: DC

## 2017-08-18 MED ORDER — IOHEXOL 350 MG/ML SOLN
INTRAVENOUS | Status: DC | PRN
  Administered 2017-08-18: 55 mL via INTRA_ARTERIAL

## 2017-08-18 MED ORDER — MIDAZOLAM HCL 5 MG/5ML IJ SOLN
INTRAMUSCULAR | Status: DC | PRN
  Administered 2017-08-18: 3 mg via INTRAVENOUS
  Administered 2017-08-18: 1 mg via INTRAVENOUS
  Administered 2017-08-18: 3 mg via INTRAVENOUS
  Administered 2017-08-18: 1 mg via INTRAVENOUS
  Administered 2017-08-18: 2 mg via INTRAVENOUS

## 2017-08-18 MED ORDER — ISOSORBIDE MONONITRATE ER 30 MG PO TB24: 15.0000 mg | ORAL_TABLET | Freq: Every day | ORAL | 0 refills | Status: DC

## 2017-08-18 MED ORDER — VERAPAMIL HCL 2.5 MG/ML IV SOLN: INTRA_ARTERIAL | Status: DC | PRN

## 2017-08-18 MED ORDER — SODIUM CHLORIDE 0.9% FLUSH
3.0000 mL | Freq: Two times a day (BID) | INTRAVENOUS | Status: DC
  Administered 2017-08-19 – 2017-08-21 (×3): 3 mL via INTRAVENOUS

## 2017-08-18 MED ORDER — ARTIFICIAL TEARS OPHTHALMIC OINT
TOPICAL_OINTMENT | OPHTHALMIC | Status: DC | PRN
  Administered 2017-08-18: 1 via OPHTHALMIC

## 2017-08-18 MED ORDER — MAGNESIUM SULFATE 50 % IJ SOLN
40.0000 meq | INTRAMUSCULAR | Status: DC
Start: 2017-08-18 — End: 2017-08-19
  Filled 2017-08-18: qty 9.85

## 2017-08-18 MED ORDER — SODIUM CHLORIDE 0.9 % IV SOLN
INTRAVENOUS | Status: DC
  Filled 2017-08-18: qty 30

## 2017-08-18 MED ORDER — PROPOFOL 10 MG/ML IV BOLUS
INTRAVENOUS | Status: AC
  Filled 2017-08-18: qty 20

## 2017-08-18 MED ORDER — ASPIRIN 81 MG PO CHEW: 81.0000 mg | CHEWABLE_TABLET | Freq: Every day | ORAL | Status: DC

## 2017-08-18 MED ORDER — VERAPAMIL HCL 2.5 MG/ML IV SOLN
INTRA_ARTERIAL | Status: DC | PRN
  Administered 2017-08-18: 7.5 mL via INTRA_ARTERIAL

## 2017-08-18 MED ORDER — DEXMEDETOMIDINE HCL IN NACL 400 MCG/100ML IV SOLN
0.1000 ug/kg/h | INTRAVENOUS | Status: AC
  Administered 2017-08-18: .3 ug/kg/h via INTRAVENOUS
  Filled 2017-08-18: qty 100

## 2017-08-18 MED ORDER — NITROGLYCERIN 0.4 MG SL SUBL: 0.4000 mg | SUBLINGUAL_TABLET | SUBLINGUAL | 2 refills | Status: DC | PRN

## 2017-08-18 MED ORDER — LACTATED RINGERS IV SOLN
INTRAVENOUS | Status: DC | PRN
  Administered 2017-08-18: 20:00:00 via INTRAVENOUS

## 2017-08-18 MED ORDER — SODIUM CHLORIDE 0.9 % IV SOLN
INTRAVENOUS | Status: AC
  Administered 2017-08-18: .6 [IU]/h via INTRAVENOUS
  Filled 2017-08-18: qty 1

## 2017-08-18 MED ORDER — HEMOSTATIC AGENTS (NO CHARGE) OPTIME
TOPICAL | Status: DC | PRN
  Administered 2017-08-18: 1 via TOPICAL

## 2017-08-18 MED ORDER — NITROGLYCERIN 0.4 MG SL SUBL
SUBLINGUAL_TABLET | SUBLINGUAL | Status: AC
  Filled 2017-08-18: qty 1

## 2017-08-18 MED ORDER — ACETAMINOPHEN 325 MG PO TABS: 650.0000 mg | ORAL_TABLET | ORAL | Status: DC | PRN

## 2017-08-18 MED ORDER — SODIUM CHLORIDE 0.9 % IJ SOLN
OROMUCOSAL | Status: DC | PRN
  Administered 2017-08-18 (×3): via TOPICAL

## 2017-08-18 MED ORDER — TRANEXAMIC ACID (OHS) PUMP PRIME SOLUTION
2.0000 mg/kg | INTRAVENOUS | Status: DC
  Filled 2017-08-18: qty 1.04

## 2017-08-18 MED ORDER — NITROGLYCERIN IN D5W 200-5 MCG/ML-% IV SOLN
2.0000 ug/min | INTRAVENOUS | Status: DC
  Filled 2017-08-18: qty 250

## 2017-08-18 MED ORDER — EPINEPHRINE PF 1 MG/ML IJ SOLN
0.0000 ug/min | INTRAVENOUS | Status: DC
  Filled 2017-08-18: qty 4

## 2017-08-18 MED ORDER — SODIUM CHLORIDE 0.9% FLUSH: 3.0000 mL | INTRAVENOUS | Status: DC | PRN

## 2017-08-18 MED ORDER — ROCURONIUM BROMIDE 10 MG/ML (PF) SYRINGE
PREFILLED_SYRINGE | INTRAVENOUS | Status: DC | PRN
  Administered 2017-08-18: 20 mg via INTRAVENOUS
  Administered 2017-08-18: 40 mg via INTRAVENOUS
  Administered 2017-08-18 – 2017-08-19 (×2): 20 mg via INTRAVENOUS

## 2017-08-18 MED ORDER — ONDANSETRON HCL 4 MG/2ML IJ SOLN: 4.0000 mg | Freq: Four times a day (QID) | INTRAMUSCULAR | Status: DC | PRN

## 2017-08-18 MED ORDER — PROPOFOL 10 MG/ML IV BOLUS
INTRAVENOUS | Status: DC | PRN
  Administered 2017-08-18: 40 mg via INTRAVENOUS

## 2017-08-18 MED ORDER — DEXTROSE 5 % IV SOLN
750.0000 mg | INTRAVENOUS | Status: AC
  Administered 2017-08-19: 750 mg via INTRAVENOUS
  Filled 2017-08-18: qty 750

## 2017-08-18 MED ORDER — DEXTROSE 5 % IV SOLN
1.5000 g | INTRAVENOUS | Status: AC
  Administered 2017-08-18: 1.5 g via INTRAVENOUS
  Filled 2017-08-18: qty 1.5

## 2017-08-18 MED ORDER — SUCCINYLCHOLINE CHLORIDE 20 MG/ML IJ SOLN
INTRAMUSCULAR | Status: DC | PRN
  Administered 2017-08-18: 60 mg via INTRAVENOUS

## 2017-08-18 MED ORDER — MIDAZOLAM HCL 10 MG/2ML IJ SOLN
INTRAMUSCULAR | Status: AC
  Filled 2017-08-18: qty 2

## 2017-08-18 MED ORDER — SODIUM CHLORIDE 0.9 % IV SOLN: 250.0000 mL | INTRAVENOUS | Status: DC | PRN

## 2017-08-18 MED ORDER — 0.9 % SODIUM CHLORIDE (POUR BTL) OPTIME
TOPICAL | Status: DC | PRN
  Administered 2017-08-18: 6000 mL

## 2017-08-18 MED ORDER — SODIUM CHLORIDE 0.9 % WEIGHT BASED INFUSION
3.0000 mL/kg/h | INTRAVENOUS | Status: DC
  Administered 2017-08-18: 3 mL/kg/h via INTRAVENOUS

## 2017-08-18 MED ORDER — NITROGLYCERIN 0.4 MG SL SUBL
0.4000 mg | SUBLINGUAL_TABLET | SUBLINGUAL | Status: DC | PRN
  Administered 2017-08-18: 0.4 mg via SUBLINGUAL

## 2017-08-18 MED ORDER — FENTANYL CITRATE (PF) 250 MCG/5ML IJ SOLN
INTRAMUSCULAR | Status: AC
  Filled 2017-08-18: qty 20

## 2017-08-18 MED ORDER — HEPARIN SODIUM (PORCINE) 1000 UNIT/ML IJ SOLN
INTRAMUSCULAR | Status: DC | PRN
  Administered 2017-08-18: 2000 [IU] via INTRAVENOUS
  Administered 2017-08-18: 14000 [IU] via INTRAVENOUS

## 2017-08-18 MED ORDER — POTASSIUM CHLORIDE 2 MEQ/ML IV SOLN
80.0000 meq | INTRAVENOUS | Status: DC
  Filled 2017-08-18: qty 40

## 2017-08-18 MED ORDER — ASPIRIN 81 MG PO CHEW: 81.0000 mg | CHEWABLE_TABLET | ORAL | Status: DC

## 2017-08-18 MED ORDER — MORPHINE SULFATE (PF) 10 MG/ML IV SOLN: 2.0000 mg | INTRAVENOUS | Status: DC | PRN

## 2017-08-18 MED ORDER — PLASMA-LYTE 148 IV SOLN
INTRAVENOUS | Status: AC
  Administered 2017-08-18: 500 mL
  Filled 2017-08-18: qty 2.5

## 2017-08-18 MED ORDER — LIDOCAINE HCL (PF) 1 % IJ SOLN
INTRAMUSCULAR | Status: DC | PRN
  Administered 2017-08-18: 5 mL

## 2017-08-18 MED ORDER — NITROGLYCERIN IN D5W 200-5 MCG/ML-% IV SOLN
0.0000 ug/min | INTRAVENOUS | Status: DC
  Administered 2017-08-18: 5 ug/min via INTRAVENOUS

## 2017-08-18 MED ORDER — TRANEXAMIC ACID (OHS) BOLUS VIA INFUSION
15.0000 mg/kg | INTRAVENOUS | Status: AC
  Administered 2017-08-18: 783 mg via INTRAVENOUS
  Filled 2017-08-18: qty 783

## 2017-08-18 MED ORDER — HEPARIN SODIUM (PORCINE) 1000 UNIT/ML IJ SOLN
INTRAMUSCULAR | Status: DC | PRN
  Administered 2017-08-18: 2500 [IU] via INTRAVENOUS

## 2017-08-18 MED ORDER — TRANEXAMIC ACID 1000 MG/10ML IV SOLN
1.5000 mg/kg/h | INTRAVENOUS | Status: AC
  Administered 2017-08-18: 1.5 mg/kg/h via INTRAVENOUS
  Filled 2017-08-18: qty 25

## 2017-08-18 MED ORDER — SODIUM CHLORIDE 0.9 % IV SOLN: INTRAVENOUS | Status: AC

## 2017-08-18 MED ORDER — DOPAMINE-DEXTROSE 3.2-5 MG/ML-% IV SOLN
0.0000 ug/kg/min | INTRAVENOUS | Status: DC
  Filled 2017-08-18: qty 250

## 2017-08-18 MED ORDER — VANCOMYCIN HCL IN DEXTROSE 1-5 GM/200ML-% IV SOLN
1000.0000 mg | INTRAVENOUS | Status: AC
  Administered 2017-08-18: 1000 mg via INTRAVENOUS
  Filled 2017-08-18: qty 200

## 2017-08-18 MED ORDER — SODIUM CHLORIDE 0.9 % IV SOLN
30.0000 ug/min | INTRAVENOUS | Status: AC
  Administered 2017-08-18: 50 ug/min via INTRAVENOUS
  Filled 2017-08-18: qty 2

## 2017-08-18 MED ORDER — SODIUM CHLORIDE 0.9 % WEIGHT BASED INFUSION: 1.0000 mL/kg/h | INTRAVENOUS | Status: DC

## 2017-08-18 MED ORDER — FENTANYL CITRATE (PF) 250 MCG/5ML IJ SOLN
INTRAMUSCULAR | Status: DC | PRN
  Administered 2017-08-18: 50 ug via INTRAVENOUS
  Administered 2017-08-18: 200 ug via INTRAVENOUS
  Administered 2017-08-18: 100 ug via INTRAVENOUS
  Administered 2017-08-18 – 2017-08-19 (×2): 150 ug via INTRAVENOUS
  Administered 2017-08-19 (×2): 100 ug via INTRAVENOUS
  Administered 2017-08-19: 150 ug via INTRAVENOUS

## 2017-08-18 MED ORDER — SODIUM CHLORIDE 0.9% FLUSH: 3.0000 mL | Freq: Two times a day (BID) | INTRAVENOUS | Status: DC

## 2017-08-18 SURGICAL SUPPLY — 73 items
ADAPTER CARDIO PERF ANTE/RETRO (ADAPTER) ×2 IMPLANT
ADPR PRFSN 84XANTGRD RTRGD (ADAPTER) ×2
BAG DECANTER FOR FLEXI CONT (MISCELLANEOUS) ×4 IMPLANT
BANDAGE ACE 4X5 VEL STRL LF (GAUZE/BANDAGES/DRESSINGS) ×4 IMPLANT
BANDAGE ACE 6X5 VEL STRL LF (GAUZE/BANDAGES/DRESSINGS) ×4 IMPLANT
BASKET HEART  (ORDER IN 25'S) (MISCELLANEOUS) ×1
BASKET HEART (ORDER IN 25'S) (MISCELLANEOUS) ×1
BASKET HEART (ORDER IN 25S) (MISCELLANEOUS) ×2 IMPLANT
BLADE STERNUM SYSTEM 6 (BLADE) ×4 IMPLANT
BLADE SURG 11 STRL SS (BLADE) ×2 IMPLANT
BNDG GAUZE ELAST 4 BULKY (GAUZE/BANDAGES/DRESSINGS) ×4 IMPLANT
CANISTER SUCT 3000ML PPV (MISCELLANEOUS) ×4 IMPLANT
CANNULA EZ GLIDE AORTIC 21FR (CANNULA) ×4 IMPLANT
CANNULA GUNDRY RCSP 15FR (MISCELLANEOUS) ×2 IMPLANT
CATH CPB KIT HENDRICKSON (MISCELLANEOUS) ×4 IMPLANT
CATH ROBINSON RED A/P 18FR (CATHETERS) ×4 IMPLANT
CATH THORACIC 36FR (CATHETERS) ×4 IMPLANT
CATH THORACIC 36FR RT ANG (CATHETERS) ×4 IMPLANT
CLIP FOGARTY SPRING 6M (CLIP) ×2 IMPLANT
CLIP VESOCCLUDE SM WIDE 24/CT (CLIP) ×2 IMPLANT
CRADLE DONUT ADULT HEAD (MISCELLANEOUS) ×4 IMPLANT
DRAPE CARDIOVASCULAR INCISE (DRAPES) ×4
DRAPE SLUSH/WARMER DISC (DRAPES) ×4 IMPLANT
DRAPE SRG 135X102X78XABS (DRAPES) ×2 IMPLANT
DRSG COVADERM 4X14 (GAUZE/BANDAGES/DRESSINGS) ×4 IMPLANT
DRSG TEGADERM 4X4.75 (GAUZE/BANDAGES/DRESSINGS) ×2 IMPLANT
ELECT REM PT RETURN 9FT ADLT (ELECTROSURGICAL) ×8
ELECTRODE REM PT RTRN 9FT ADLT (ELECTROSURGICAL) ×4 IMPLANT
FELT TEFLON 1X6 (MISCELLANEOUS) ×6 IMPLANT
GAUZE SPONGE 4X4 12PLY STRL (GAUZE/BANDAGES/DRESSINGS) ×8 IMPLANT
GAUZE SPONGE 4X4 12PLY STRL LF (GAUZE/BANDAGES/DRESSINGS) ×4 IMPLANT
GLOVE SURG SIGNA 7.5 PF LTX (GLOVE) ×12 IMPLANT
GOWN STRL REUS W/ TWL LRG LVL3 (GOWN DISPOSABLE) ×8 IMPLANT
GOWN STRL REUS W/ TWL XL LVL3 (GOWN DISPOSABLE) ×4 IMPLANT
GOWN STRL REUS W/TWL LRG LVL3 (GOWN DISPOSABLE) ×16
GOWN STRL REUS W/TWL XL LVL3 (GOWN DISPOSABLE) ×8
HEMOSTAT POWDER SURGIFOAM 1G (HEMOSTASIS) ×12 IMPLANT
HEMOSTAT SURGICEL 2X14 (HEMOSTASIS) ×4 IMPLANT
KIT BASIN OR (CUSTOM PROCEDURE TRAY) ×4 IMPLANT
KIT ROOM TURNOVER OR (KITS) ×4 IMPLANT
KIT SUCTION CATH 14FR (SUCTIONS) ×8 IMPLANT
KIT VASOVIEW HEMOPRO VH 3000 (KITS) ×4 IMPLANT
MARKER GRAFT CORONARY BYPASS (MISCELLANEOUS) ×12 IMPLANT
NS IRRIG 1000ML POUR BTL (IV SOLUTION) ×20 IMPLANT
PACK E OPEN HEART (SUTURE) ×4 IMPLANT
PACK OPEN HEART (CUSTOM PROCEDURE TRAY) ×4 IMPLANT
PAD ARMBOARD 7.5X6 YLW CONV (MISCELLANEOUS) ×6 IMPLANT
PAD ELECT DEFIB RADIOL ZOLL (MISCELLANEOUS) ×4 IMPLANT
PENCIL BUTTON HOLSTER BLD 10FT (ELECTRODE) ×4 IMPLANT
SET CARDIOPLEGIA MPS 5001102 (MISCELLANEOUS) ×2 IMPLANT
SOLUTION ANTI FOG 6CC (MISCELLANEOUS) ×2 IMPLANT
SUT BONE WAX W31G (SUTURE) ×4 IMPLANT
SUT MNCRL AB 4-0 PS2 18 (SUTURE) ×2 IMPLANT
SUT PROLENE 3 0 SH DA (SUTURE) ×4 IMPLANT
SUT PROLENE 4 0 RB 1 (SUTURE) ×8
SUT PROLENE 4-0 RB1 .5 CRCL 36 (SUTURE) IMPLANT
SUT PROLENE 6 0 C 1 30 (SUTURE) ×10 IMPLANT
SUT PROLENE 7 0 BV1 MDA (SUTURE) ×6 IMPLANT
SUT STEEL 6MS V (SUTURE) ×4 IMPLANT
SUT STEEL SZ 6 DBL 3X14 BALL (SUTURE) ×4 IMPLANT
SUT VIC AB 1 CTX 36 (SUTURE) ×8
SUT VIC AB 1 CTX36XBRD ANBCTR (SUTURE) ×4 IMPLANT
SUT VIC AB 2-0 CT1 27 (SUTURE) ×4
SUT VIC AB 2-0 CT1 TAPERPNT 27 (SUTURE) IMPLANT
SYSTEM SAHARA CHEST DRAIN ATS (WOUND CARE) ×4 IMPLANT
TAPE CLOTH SURG 4X10 WHT LF (GAUZE/BANDAGES/DRESSINGS) ×4 IMPLANT
TAPE PAPER 2X10 WHT MICROPORE (GAUZE/BANDAGES/DRESSINGS) ×2 IMPLANT
TOWEL GREEN STERILE (TOWEL DISPOSABLE) ×4 IMPLANT
TRAY FOLEY SILVER 16FR TEMP (SET/KITS/TRAYS/PACK) ×4 IMPLANT
TUBE FEEDING 8FR 16IN STR KANG (MISCELLANEOUS) ×4 IMPLANT
TUBING INSUFFLATION (TUBING) ×4 IMPLANT
UNDERPAD 30X30 (UNDERPADS AND DIAPERS) ×4 IMPLANT
WATER STERILE IRR 1000ML POUR (IV SOLUTION) ×8 IMPLANT

## 2017-08-18 SURGICAL SUPPLY — 12 items
CATH INFINITI 5FR ANG PIGTAIL (CATHETERS) ×1 IMPLANT
CATH OPTITORQUE TIG 4.0 5F (CATHETERS) ×1 IMPLANT
GLIDESHEATH SLEND A-KIT 6F 22G (SHEATH) ×1 IMPLANT
GUIDEWIRE INQWIRE 1.5J.035X260 (WIRE) IMPLANT
INQWIRE 1.5J .035X260CM (WIRE) ×2
KIT HEART LEFT (KITS) ×2 IMPLANT
PACK CARDIAC CATHETERIZATION (CUSTOM PROCEDURE TRAY) ×2 IMPLANT
SYR MEDRAD MARK V 150ML (SYRINGE) ×2 IMPLANT
TRANSDUCER W/STOPCOCK (MISCELLANEOUS) ×2 IMPLANT
TUBING CIL FLEX 10 FLL-RA (TUBING) ×2 IMPLANT
TUBING CONTRAST HIGH PRESS 20 (MISCELLANEOUS) ×1 IMPLANT
WIRE HI TORQ VERSACORE-J 145CM (WIRE) ×1 IMPLANT

## 2017-08-18 NOTE — Anesthesia Procedure Notes (Addendum)
Central Venous Catheter Insertion Performed by: Oleta Mouse, MD, anesthesiologist Start/End2/01/2018 8:54 PM, 08/18/2017 8:54 PM Patient location: Pre-op. Preanesthetic checklist: patient identified, IV checked, site marked, risks and benefits discussed, surgical consent, monitors and equipment checked, pre-op evaluation, timeout performed and anesthesia consent Hand hygiene performed  and maximum sterile barriers used  PA cath was placed.[place]wan type:thermodilution Procedure performed without using ultrasound guided technique. Attempts: 1 Patient tolerated the procedure well with no immediate complications.

## 2017-08-18 NOTE — Anesthesia Procedure Notes (Signed)
Arterial Line Insertion Start/End2/01/2018 8:15 PM, 08/18/2017 8:25 PM Performed by: Josephine Igo, CRNA  Preanesthetic checklist: patient identified, IV checked, surgical consent and monitors and equipment checked Patient sedated radial was placed Catheter size: 20 G Hand hygiene performed  and maximum sterile barriers used  Allen's test indicative of satisfactory collateral circulation Attempts: 1 Procedure performed without using ultrasound guided technique. Following insertion, dressing applied and Biopatch. Patient tolerated the procedure well with no immediate complications.

## 2017-08-18 NOTE — Anesthesia Procedure Notes (Signed)
Procedure Name: Intubation Date/Time: 08/18/2017 8:23 PM Performed by: Josephine Igo, CRNA Pre-anesthesia Checklist: Suction available, Emergency Drugs available, Patient identified, Patient being monitored and Timeout performed Patient Re-evaluated:Patient Re-evaluated prior to induction Oxygen Delivery Method: Circle system utilized Preoxygenation: Pre-oxygenation with 100% oxygen Induction Type: IV induction, Cricoid Pressure applied and Rapid sequence Laryngoscope Size: Miller and 2 Grade View: Grade I Tube type: Subglottic suction tube Tube size: 7.0 mm Number of attempts: 1 Airway Equipment and Method: Stylet Placement Confirmation: ETT inserted through vocal cords under direct vision,  positive ETCO2 and breath sounds checked- equal and bilateral Secured at: 22 cm Tube secured with: Tape Dental Injury: Teeth and Oropharynx as per pre-operative assessment

## 2017-08-18 NOTE — Consult Note (Signed)
Reason for Consult:3 vessel CAD + CP post cath Referring Physician: Dr. Geanie Berlin   Sarah Pena is an 77 y.o. female.  HPI: 77 yo woman with a history of tobacco abuse, COPD, MI, melanoma of chest wall and a strong family history of CAD. She recently developed substernal CP with exertion. She had increasing frequency and severity. She saw Dr, Gwenlyn Found on 08/16/17. Today she had cardiac catheterization which revealed a tight ostial LAD lesion with moderate RCA and circumflex disease. She was planning to go home, but developed CP with T wave inversions in anterior leads. Pain relieved with SL NTG, but T wave changes are persistent.   Past Medical History:  Diagnosis Date  . Asthma 05/2015  . Cancer (HCC)    Melanoma of chest wall  . COPD (chronic obstructive pulmonary disease) ([place])   . Family history of heart disease   . History of hiatal hernia    'small"  . History of kidney stones   . Myocardial infarction ([place])    "mild"  years ago  . Shortness of breath dyspnea    just recently- sob walking up stairs- "I think its pollen.    Past Surgical History:  Procedure Laterality Date  . APPENDECTOMY    . CHOLECYSTECTOMY N/A 10/29/2014   Procedure: LAPAROSCOPIC CHOLECYSTECTOMY WITH INTRAOPERATIVE CHOLANGIOGRAM;  Surgeon: Jackolyn Confer, MD;  Location: [facility];  Service: General;  Laterality: N/A;  . COLONOSCOPY    . EYE SURGERY Bilateral    catarct with lens replacement  . HERNIA REPAIR     RIH  . lipoma removed  1993   RLQ area  . MELANOMA EXCISION    . TUBAL LIGATION      Family History  Problem Relation Age of Onset  . Heart disease Mother   . Heart disease Father   . Cancer Maternal Grandfather     Social History:  reports that she quit smoking about 3 years ago. Her smoking use included cigarettes. She has a 50.00 pack-year smoking history. she has never used smokeless tobacco. She reports that she drinks alcohol. She reports that she does not use drugs.  Allergies: No  Known Allergies  Medications:  Prior to Admission:  Facility-Administered Medications Prior to Admission  Medication Dose Route Frequency Provider Last Rate Last Dose  . 0.9 %  sodium chloride infusion  500 mL Intravenous Once Gatha Mayer, MD      . [DISCONTINUED] 0.9 %  sodium chloride infusion  500 mL Intravenous Once Gatha Mayer, MD      . [DISCONTINUED] 0.9 %  sodium chloride infusion  500 mL Intravenous Once Gatha Mayer, MD       Medications Prior to Admission  Medication Sig Dispense Refill Last Dose  . albuterol (PROAIR HFA) 108 (90 Base) MCG/ACT inhaler Inhale 2 puffs into the lungs every 6 (six) hours as needed for wheezing or shortness of breath. 1 Inhaler 3 08/17/2017 at Unknown time  . aspirin 81 MG tablet Take 81 mg by mouth daily.   08/18/2017 at 0800  . calcium carbonate (TUMS - DOSED IN MG ELEMENTAL CALCIUM) 500 MG chewable tablet Chew 1 tablet by mouth daily as needed for indigestion or heartburn.   08/17/2017 at pm  . Cholecalciferol (VITAMIN D-3) 1000 UNITS CAPS Take 1,000 Units by mouth daily.    Past Week at Unknown time  . Dextrose-Fructose-Sod Citrate (NAUZENE) [phone] MG CHEW Chew 2 tablets by mouth as needed (nausea).    Past Week  at Unknown time  . dicyclomine (BENTYL) 10 MG capsule Take 1 tab twice daily as needed for abdominal pain and urgency. (Patient taking differently: Take 10 mg by mouth 2 (two) times daily. ) 60 capsule 4 08/18/2017 at 0800  . Magnesium 250 MG TABS Take 250 mg by mouth daily.   08/17/2017 at Unknown time  . naproxen sodium (ALEVE) 220 MG tablet Take 220 mg by mouth daily as needed (pain).   prn  . OIL OF OREGANO PO Take 1 capsule by mouth daily.    Past Week at Unknown time  . OVER THE COUNTER MEDICATION Place 6 drops under the tongue 2 (two) times daily. CBD oil   08/17/2017 at pm  . pantoprazole (PROTONIX) 20 MG tablet Take 1 tablet (20 mg total) by mouth daily. 90 tablet 3 08/18/2017 at 0800  . STIOLTO RESPIMAT 2.5-2.5 MCG/ACT AERS INHALE  2 PUFFS IN THE LUNGS ONCE DAILY 4 g 3 08/18/2017 at am  . TURMERIC PO Take 1 capsule by mouth daily.    Past Week at Unknown time    No results found for this or any previous visit (from the past 48 hour(s)).  No results found.  Review of Systems  Respiratory: Positive for shortness of breath.   Cardiovascular: Positive for chest pain.   Blood pressure (!) 120/98, pulse 90, temperature 98.1 F (36.7 C), temperature source Oral, resp. rate (!) 9, height 5' (1.524 m), weight 115 lb (52.2 kg), SpO2 98 %. Physical Exam  Vitals reviewed. Constitutional: She is oriented to person, place, and time. She appears well-developed and well-nourished.  HENT:  Head: Normocephalic and atraumatic.  Mouth/Throat: No oropharyngeal exudate.  Eyes: Conjunctivae are normal. No scleral icterus.  Neck: Neck supple.  No bruits  Cardiovascular: Normal rate, regular rhythm and normal heart sounds. Exam reveals no gallop and no friction rub.  No murmur heard. Respiratory: Effort normal and breath sounds normal. No respiratory distress. She has no wheezes. She has no rales.  GI: Soft. She exhibits no distension. There is no tenderness.  Musculoskeletal: She exhibits no edema.  Neurological: She is alert and oriented to person, place, and time. No cranial nerve deficit. She exhibits normal muscle tone. Coordination normal.  Skin: Skin is warm and dry.   CARDIAC CATHETERIZATION  Dist LM to Ost LAD lesion is 99% stenosed.  Ost Cx lesion is 70% stenosed.  Prox LAD lesion is 95% stenosed.  Mid LAD lesion is 95% stenosed.  Prox RCA lesion is 50% stenosed.  Mid RCA lesion is 80% stenosed.  The left ventricular systolic function is normal.  LV end diastolic pressure is normal.  The left ventricular ejection fraction is 55-65% by visual estimate.  I personally reviewed the cath images and concur with the findings noted above   Assessment/Plan: 77 yo woman with multiple CRF who presented with  progressive angina. 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 Had unstable CP post cath. CABG is indicated for survival benefit and relief of symptoms. I think it is safest to proceed tonight. There is no OR availability tomorrow.  I discussed the general nature of the procedure, including the need for general anesthesia, the incisions to be used and the use of cardiopulmonary bypass with Mrs Raben and her SO. We discussed the expected hospital stay, overall recovery and short and long term outcomes. I informed her of the indications, risks, benefits and alternatives. They understand the risks include, but are not limited to  death, stroke, MI, DVT/PE, bleeding, possible need for transfusion, infections,other organ system dysfunction including respiratory, renal, or GI complications.  She accepts the risks and agrees to proceed.  Melrose Nakayama 08/18/2017, 7:34 PM

## 2017-08-18 NOTE — Research (Signed)
CADFEM Informed Consent   Subject Name: Sarah Pena  Subject met inclusion and exclusion criteria.  The informed consent form, study requirements and expectations were reviewed with the subject and questions and concerns were addressed prior to the signing of the consent form.  The subject verbalized understanding of the trail requirements.  The subject agreed to participate in the CADFEM trial and signed the informed consent.  The informed consent was obtained prior to performance of any protocol-specific procedures for the subject.  A copy of the signed informed consent was given to the subject and a copy was placed in the subject's medical record.  Christena Flake 08/18/2017, 01:52 PM

## 2017-08-18 NOTE — Interval H&P Note (Signed)
Cath Lab Visit (complete for each Cath Lab visit)  Clinical Evaluation Leading to the Procedure:   ACS: No.  Non-ACS:    Anginal Classification: CCS II  Anti-ischemic medical therapy: No Therapy  Non-Invasive Test Results: No non-invasive testing performed  Prior CABG: No previous CABG      History and Physical Interval Note:  08/18/2017 3:41 PM  Sarah Pena  has presented today for surgery, with the diagnosis of cp  The various methods of treatment have been discussed with the patient and family. After consideration of risks, benefits and other options for treatment, the patient has consented to  Procedure(s): LEFT HEART CATH AND CORONARY ANGIOGRAPHY (N/A) as a surgical intervention .  The patient's history has been reviewed, patient examined, no change in status, stable for surgery.  I have reviewed the patient's chart and labs.  Questions were answered to the patient's satisfaction.     Quay Burow

## 2017-08-18 NOTE — Anesthesia Procedure Notes (Signed)
Central Venous Catheter Insertion Performed by: Oleta Mouse, MD, anesthesiologist Start/End2/01/2018 8:54 PM, 08/18/2017 8:54 PM Patient location: OR. Preanesthetic checklist: patient identified, IV checked, site marked, risks and benefits discussed, surgical consent, monitors and equipment checked, pre-op evaluation, timeout performed and anesthesia consent Position: Trendelenburg Lidocaine 1% used for infiltration and patient sedated Hand hygiene performed , maximum sterile barriers used  and Seldinger technique used Catheter size: 9 Fr Total catheter length 10. Central line was placed.MAC introducer Procedure performed using ultrasound guided technique. Ultrasound Notes:anatomy identified, needle tip was noted to be adjacent to the nerve/plexus identified, no ultrasound evidence of intravascular and/or intraneural injection and image(s) printed for medical record Attempts: 1 Following insertion, line sutured. Post procedure assessment: blood return through all ports, free fluid flow and no air  Patient tolerated the procedure well with no immediate complications.

## 2017-08-18 NOTE — Progress Notes (Addendum)
Addendum: taking patient to OR now. Heparin will not be started.   ANTICOAGULATION CONSULT NOTE - Initial Consult  Pharmacy Consult for heparin Indication: chest pain/ACS  No Known Allergies  Patient Measurements: Height: 5' (152.4 cm) Weight: 115 lb (52.2 kg) IBW/kg (Calculated) : 45.5 Heparin Dosing Weight: 52kg  Vital Signs: Temp: 98.1 F (36.7 C) (02/07 1330) Temp Source: Oral (02/07 1330) BP: 120/98 (02/07 1850) Pulse Rate: 90 (02/07 1850)  Labs: Recent Labs    08/16/17 1556  HGB 13.6  HCT 41.4  PLT 273  APTT 25  LABPROT 10.4  INR 1.0  CREATININE 0.74    Estimated Creatinine Clearance: 43.6 mL/min (by C-G formula based on SCr of 0.74 mg/dL).   Medical History: Past Medical History:  Diagnosis Date  . Asthma 05/2015  . Cancer (HCC)    Melanoma of chest wall  . COPD (chronic obstructive pulmonary disease) ([place])   . Family history of heart disease   . History of hiatal hernia    'small"  . History of kidney stones   . Myocardial infarction ([place])    "mild"  years ago  . Shortness of breath dyspnea    just recently- sob walking up stairs- "I think its pollen.    Assessment: 77 year old female s/p cath found to have multivessel CAD. Plan was to discharge and have follow up for outpatient CABG. Patient began having chest pain in short stay, patient will now be admitted and placed on IV heparin.  TR band removed ~1915.   Goal of Therapy:  Heparin level 0.3-0.7 units/ml Monitor platelets by anticoagulation protocol: Yes   Plan:  Start heparin infusion at 650 units/hr Check anti-Xa level in 8 hours and daily while on heparin Continue to monitor H&H and platelets  Erin Hearing PharmD., BCPS Clinical Pharmacist 08/18/2017 7:24 PM

## 2017-08-18 NOTE — Brief Op Note (Signed)
08/18/2017  3:01 PM  PATIENT:  Dorian Pod  77 y.o. female  PRE-OPERATIVE DIAGNOSIS:  CAD  POST-OPERATIVE DIAGNOSIS:  CAD  PROCEDURE:  Procedure(s): CORONARY ARTERY BYPASS GRAFTING (CABG) x 3, USING LEFT INTERNAL MAMMARY ARTERY AND RIGHT GREATER SAPHENOUS VEIN HARVESTED ENDOSCOPICALLY (N/A) TRANSESOPHAGEAL ECHOCARDIOGRAM (TEE) (N/A)  LIMA to LAD SVG to OM1 SVG to PDA  SURGEON:  Surgeon(s) and Role:    * Melrose Nakayama, MD - Primary  PHYSICIAN ASSISTANT:  Nicholes Rough, PA-C   ANESTHESIA:   general  EBL:  950 mL   BLOOD ADMINISTERED:2 UNITS PRBC  DRAINS: ROUTINE   LOCAL MEDICATIONS USED:  NONE  SPECIMEN:  No Specimen  DISPOSITION OF SPECIMEN:  N/A  COUNTS:  YES  DICTATION: .Dragon Dictation  PLAN OF CARE: Admit to inpatient   PATIENT DISPOSITION:  ICU - intubated and hemodynamically stable.   Delay start of Pharmacological VTE agent (>24hrs) due to surgical blood loss or risk of bleeding: yes

## 2017-08-18 NOTE — Progress Notes (Signed)
Pt having cp 5/10, protocol followed and PA Gae Bon for cardiology was paged/ returned call, Dr Gwenlyn Found was notified and wants pt admitted. Pt went to 8/10 pain but now is 2/10 pain after 1 nitro,.

## 2017-08-18 NOTE — Anesthesia Preprocedure Evaluation (Signed)
Anesthesia Evaluation  Patient identified by MRN, date of birth, ID band Patient awake    Reviewed: Allergy & Precautions, NPO status , Patient's Chart, lab work & pertinent test results  Airway Mallampati: I  TM Distance: >3 FB Neck ROM: Full    Dental  (+) Upper Dentures, Partial Lower   Pulmonary shortness of breath, asthma , neg sleep apnea, COPD,  COPD inhaler, neg recent URI, former smoker,    breath sounds clear to auscultation       Cardiovascular + angina at rest + CAD and + Past MI   Rhythm:Regular     Neuro/Psych negative neurological ROS  negative psych ROS   GI/Hepatic Neg liver ROS, hiatal hernia, GERD  Medicated,  Endo/Other  negative endocrine ROS  Renal/GU negative Renal ROS     Musculoskeletal negative musculoskeletal ROS (+)   Abdominal   Peds  Hematology negative hematology ROS (+)   Anesthesia Other Findings   Reproductive/Obstetrics                             Anesthesia Physical Anesthesia Plan  ASA: IV and emergent  Anesthesia Plan: General   Post-op Pain Management:    Induction: Intravenous, Rapid sequence and Cricoid pressure planned  PONV Risk Score and Plan: 3 and Treatment may vary due to age or medical condition  Airway Management Planned: Oral ETT  Additional Equipment: Arterial line, CVP, PA Cath, TEE and Ultrasound Guidance Line Placement  Intra-op Plan:   Post-operative Plan: Post-operative intubation/ventilation  Informed Consent: I have reviewed the patients History and Physical, chart, labs and discussed the procedure including the risks, benefits and alternatives for the proposed anesthesia with the patient or authorized representative who has indicated his/her understanding and acceptance.   Dental advisory given  Plan Discussed with: CRNA and Surgeon  Anesthesia Plan Comments:         Anesthesia Quick Evaluation

## 2017-08-18 NOTE — Progress Notes (Signed)
I was called by nurse for patient complaints of chest pain. 1 SL NTG given. When I arrived chest pain was resolved. However, EKG showed diffuse T wave inversions in territory of proximal LAD. Dr. Radford Pax notified and she reviewed EKG. Dr. Radford Pax has notified CT surgery and Dr. Roxan Hockey is here to see pt. Possible surgery tonight. Pt ate around 5 pm. MD aware.  Currently pt is chest pain free and hemodynamically stable.   Update: Dr. Roxan Hockey is here and will take pt to the OR now.   Pt and husband aware of plan and agree.   Daune Perch, AGNP-C [facility] 08/18/2017  7:31 PM Pager: [phone]

## 2017-08-19 ENCOUNTER — Inpatient Hospital Stay (HOSPITAL_COMMUNITY): Payer: Medicare Other

## 2017-08-19 ENCOUNTER — Encounter (HOSPITAL_COMMUNITY): Payer: Self-pay | Admitting: Cardiovascular Disease

## 2017-08-19 DIAGNOSIS — Z951 Presence of aortocoronary bypass graft: Secondary | ICD-10-CM

## 2017-08-19 HISTORY — DX: Presence of aortocoronary bypass graft: Z95.1

## 2017-08-19 LAB — POCT I-STAT, CHEM 8
BUN: 10 mg/dL (ref 6–20)
BUN: 6 mg/dL (ref 6–20)
BUN: 6 mg/dL (ref 6–20)
BUN: 6 mg/dL (ref 6–20)
BUN: 7 mg/dL (ref 6–20)
BUN: 9 mg/dL (ref 6–20)
CALCIUM ION: 0.88 mmol/L — AB (ref 1.15–1.40)
CALCIUM ION: 1.08 mmol/L — AB (ref 1.15–1.40)
CHLORIDE: 107 mmol/L (ref 101–111)
CHLORIDE: 99 mmol/L — AB (ref 101–111)
CHLORIDE: 102 mmol/L (ref 101–111)
CREATININE: 0.4 mg/dL — AB (ref 0.44–1.00)
Calcium, Ion: 0.92 mmol/L — ABNORMAL LOW (ref 1.15–1.40)
Calcium, Ion: 0.99 mmol/L — ABNORMAL LOW (ref 1.15–1.40)
Calcium, Ion: 1.18 mmol/L (ref 1.15–1.40)
Calcium, Ion: 1.21 mmol/L (ref 1.15–1.40)
Chloride: 102 mmol/L (ref 101–111)
Chloride: 103 mmol/L (ref 101–111)
Chloride: 106 mmol/L (ref 101–111)
Creatinine, Ser: 0.4 mg/dL — ABNORMAL LOW (ref 0.44–1.00)
Creatinine, Ser: 0.4 mg/dL — ABNORMAL LOW (ref 0.44–1.00)
Creatinine, Ser: 0.4 mg/dL — ABNORMAL LOW (ref 0.44–1.00)
Creatinine, Ser: 0.5 mg/dL (ref 0.44–1.00)
Creatinine, Ser: 0.6 mg/dL (ref 0.44–1.00)
GLUCOSE: 90 mg/dL (ref 65–99)
GLUCOSE: 163 mg/dL — AB (ref 65–99)
Glucose, Bld: 118 mg/dL — ABNORMAL HIGH (ref 65–99)
Glucose, Bld: 120 mg/dL — ABNORMAL HIGH (ref 65–99)
Glucose, Bld: 133 mg/dL — ABNORMAL HIGH (ref 65–99)
Glucose, Bld: 138 mg/dL — ABNORMAL HIGH (ref 65–99)
HCT: 32 % — ABNORMAL LOW (ref 36.0–46.0)
HCT: 20 % — ABNORMAL LOW (ref 36.0–46.0)
HCT: 32 % — ABNORMAL LOW (ref 36.0–46.0)
HCT: 39 % (ref 36.0–46.0)
HEMATOCRIT: 25 % — AB (ref 36.0–46.0)
HEMATOCRIT: 29 % — AB (ref 36.0–46.0)
HEMOGLOBIN: 10.9 g/dL — AB (ref 12.0–15.0)
HEMOGLOBIN: 8.5 g/dL — AB (ref 12.0–15.0)
HEMOGLOBIN: 9.9 g/dL — AB (ref 12.0–15.0)
Hemoglobin: 10.9 g/dL — ABNORMAL LOW (ref 12.0–15.0)
Hemoglobin: 6.8 g/dL — CL (ref 12.0–15.0)
Hemoglobin: 13.3 g/dL (ref 12.0–15.0)
POTASSIUM: 3.6 mmol/L (ref 3.5–5.1)
POTASSIUM: 3.8 mmol/L (ref 3.5–5.1)
Potassium: 3.8 mmol/L (ref 3.5–5.1)
Potassium: 3.6 mmol/L (ref 3.5–5.1)
Potassium: 3.8 mmol/L (ref 3.5–5.1)
Potassium: 4.7 mmol/L (ref 3.5–5.1)
SODIUM: 139 mmol/L (ref 135–145)
SODIUM: 137 mmol/L (ref 135–145)
Sodium: 138 mmol/L (ref 135–145)
Sodium: 140 mmol/L (ref 135–145)
Sodium: 140 mmol/L (ref 135–145)
Sodium: 142 mmol/L (ref 135–145)
TCO2: 23 mmol/L (ref 22–32)
TCO2: 23 mmol/L (ref 22–32)
TCO2: 24 mmol/L (ref 22–32)
TCO2: 24 mmol/L (ref 22–32)
TCO2: 25 mmol/L (ref 22–32)
TCO2: 26 mmol/L (ref 22–32)

## 2017-08-19 LAB — POCT I-STAT 3, ART BLOOD GAS (G3+)
ACID-BASE DEFICIT: 6 mmol/L — AB (ref 0.0–2.0)
ACID-BASE DEFICIT: 8 mmol/L — AB (ref 0.0–2.0)
Acid-base deficit: 2 mmol/L (ref 0.0–2.0)
BICARBONATE: 20.7 mmol/L (ref 20.0–28.0)
BICARBONATE: 18.6 mmol/L — AB (ref 20.0–28.0)
Bicarbonate: 23.1 mmol/L (ref 20.0–28.0)
O2 SAT: 100 %
O2 SAT: 98 %
O2 Saturation: 98 %
PCO2 ART: 38 mmHg (ref 32.0–48.0)
PH ART: 7.322 — AB (ref 7.350–7.450)
PO2 ART: 116 mmHg — AB (ref 83.0–108.0)
Patient temperature: 36.7
TCO2: 24 mmol/L (ref 22–32)
TCO2: 22 mmol/L (ref 22–32)
TCO2: 20 mmol/L — ABNORMAL LOW (ref 22–32)
pCO2 arterial: 39.2 mmHg (ref 32.0–48.0)
pCO2 arterial: 38.7 mmHg (ref 32.0–48.0)
pH, Arterial: 7.392 (ref 7.350–7.450)
pH, Arterial: 7.288 — ABNORMAL LOW (ref 7.350–7.450)
pO2, Arterial: 369 mmHg — ABNORMAL HIGH (ref 83.0–108.0)
pO2, Arterial: 105 mmHg (ref 83.0–108.0)

## 2017-08-19 LAB — GLUCOSE, CAPILLARY
GLUCOSE-CAPILLARY: 119 mg/dL — AB (ref 65–99)
GLUCOSE-CAPILLARY: 112 mg/dL — AB (ref 65–99)
GLUCOSE-CAPILLARY: 151 mg/dL — AB (ref 65–99)
GLUCOSE-CAPILLARY: 163 mg/dL — AB (ref 65–99)
GLUCOSE-CAPILLARY: 123 mg/dL — AB (ref 65–99)
GLUCOSE-CAPILLARY: 106 mg/dL — AB (ref 65–99)
Glucose-Capillary: 107 mg/dL — ABNORMAL HIGH (ref 65–99)
Glucose-Capillary: 133 mg/dL — ABNORMAL HIGH (ref 65–99)

## 2017-08-19 LAB — CBC
HCT: 38 % (ref 36.0–46.0)
HCT: 40.2 % (ref 36.0–46.0)
HCT: 40 % (ref 36.0–46.0)
HEMOGLOBIN: 12.8 g/dL (ref 12.0–15.0)
Hemoglobin: 13.5 g/dL (ref 12.0–15.0)
Hemoglobin: 13.4 g/dL (ref 12.0–15.0)
MCH: 30.2 pg (ref 26.0–34.0)
MCH: 30.3 pg (ref 26.0–34.0)
MCH: 30.1 pg (ref 26.0–34.0)
MCHC: 33.7 g/dL (ref 30.0–36.0)
MCHC: 33.6 g/dL (ref 30.0–36.0)
MCHC: 33.5 g/dL (ref 30.0–36.0)
MCV: 89.6 fL (ref 78.0–100.0)
MCV: 90.3 fL (ref 78.0–100.0)
MCV: 89.9 fL (ref 78.0–100.0)
PLATELETS: 104 10*3/uL — AB (ref 150–400)
PLATELETS: 112 10*3/uL — AB (ref 150–400)
PLATELETS: 133 10*3/uL — AB (ref 150–400)
RBC: 4.24 MIL/uL (ref 3.87–5.11)
RBC: 4.45 MIL/uL (ref 3.87–5.11)
RBC: 4.45 MIL/uL (ref 3.87–5.11)
RDW: 13.6 % (ref 11.5–15.5)
RDW: 14.3 % (ref 11.5–15.5)
RDW: 14.2 % (ref 11.5–15.5)
WBC: 17.3 10*3/uL — ABNORMAL HIGH (ref 4.0–10.5)
WBC: 13 10*3/uL — AB (ref 4.0–10.5)
WBC: 14.1 10*3/uL — ABNORMAL HIGH (ref 4.0–10.5)

## 2017-08-19 LAB — BASIC METABOLIC PANEL
Anion gap: 10 (ref 5–15)
BUN: 7 mg/dL (ref 6–20)
CALCIUM: 7.5 mg/dL — AB (ref 8.9–10.3)
CO2: 18 mmol/L — ABNORMAL LOW (ref 22–32)
Chloride: 108 mmol/L (ref 101–111)
Creatinine, Ser: 0.64 mg/dL (ref 0.44–1.00)
GFR calc Af Amer: >60 mL/min (ref >60)
GLUCOSE: 168 mg/dL — AB (ref 65–99)
POTASSIUM: 5.2 mmol/L — AB (ref 3.5–5.1)
SODIUM: 136 mmol/L (ref 135–145)

## 2017-08-19 LAB — MAGNESIUM
MAGNESIUM: 3.5 mg/dL — AB (ref 1.7–2.4)
Magnesium: 2.6 mg/dL — ABNORMAL HIGH (ref 1.7–2.4)

## 2017-08-19 LAB — ECHO INTRAOPERATIVE TEE
Height: 60 in
Weight: 1840 oz

## 2017-08-19 LAB — POCT I-STAT 4, (NA,K, GLUC, HGB,HCT)
GLUCOSE: 118 mg/dL — AB (ref 65–99)
HEMATOCRIT: 36 % (ref 36.0–46.0)
HEMOGLOBIN: 12.2 g/dL (ref 12.0–15.0)
Potassium: 3.3 mmol/L — ABNORMAL LOW (ref 3.5–5.1)
Sodium: 142 mmol/L (ref 135–145)

## 2017-08-19 LAB — PROTIME-INR
INR: 1.51
PROTHROMBIN TIME: 18.1 s — AB (ref 11.4–15.2)

## 2017-08-19 LAB — APTT: aPTT: 31 seconds (ref 24–36)

## 2017-08-19 LAB — CREATININE, SERUM
CREATININE: 0.55 mg/dL (ref 0.44–1.00)
GFR calc Af Amer: >60 mL/min (ref >60)
GFR calc non Af Amer: >60 mL/min (ref >60)

## 2017-08-19 LAB — MRSA PCR SCREENING: MRSA by PCR: NEGATIVE

## 2017-08-19 MED ORDER — TRAMADOL HCL 50 MG PO TABS
50.0000 mg | ORAL_TABLET | ORAL | Status: DC | PRN
  Administered 2017-08-20 (×2): 50 mg via ORAL
  Filled 2017-08-19 (×2): qty 1
  Filled 2017-08-19: qty 2

## 2017-08-19 MED ORDER — BISACODYL 10 MG RE SUPP: 10.0000 mg | Freq: Every day | RECTAL | Status: DC

## 2017-08-19 MED ORDER — DOCUSATE SODIUM 100 MG PO CAPS
200.0000 mg | ORAL_CAPSULE | Freq: Every day | ORAL | Status: DC
  Administered 2017-08-19 – 2017-08-21 (×3): 200 mg via ORAL
  Filled 2017-08-19 (×3): qty 2

## 2017-08-19 MED ORDER — SODIUM CHLORIDE 0.9 % IV SOLN: 500.0000 mL | Freq: Once | INTRAVENOUS | Status: AC

## 2017-08-19 MED ORDER — SODIUM BICARBONATE 8.4 % IV SOLN
100.0000 meq | Freq: Once | INTRAVENOUS | Status: AC
  Administered 2017-08-19: 100 meq via INTRAVENOUS

## 2017-08-19 MED ORDER — ASPIRIN EC 325 MG PO TBEC: 325.0000 mg | DELAYED_RELEASE_TABLET | Freq: Every day | ORAL | Status: DC

## 2017-08-19 MED ORDER — INSULIN REGULAR HUMAN 100 UNIT/ML IJ SOLN
INTRAMUSCULAR | Status: DC
  Filled 2017-08-19: qty 1

## 2017-08-19 MED ORDER — OXYCODONE HCL 5 MG PO TABS
5.0000 mg | ORAL_TABLET | ORAL | Status: DC | PRN
  Filled 2017-08-19: qty 1

## 2017-08-19 MED ORDER — ORAL CARE MOUTH RINSE
15.0000 mL | Freq: Four times a day (QID) | OROMUCOSAL | Status: DC
  Administered 2017-08-19: 15 mL via OROMUCOSAL

## 2017-08-19 MED ORDER — LACTATED RINGERS IV SOLN: INTRAVENOUS | Status: DC

## 2017-08-19 MED ORDER — POTASSIUM CHLORIDE 10 MEQ/50ML IV SOLN
10.0000 meq | Freq: Once | INTRAVENOUS | Status: AC
  Administered 2017-08-19: 10 meq via INTRAVENOUS
  Filled 2017-08-19: qty 50

## 2017-08-19 MED ORDER — LEVALBUTEROL HCL 0.63 MG/3ML IN NEBU
0.6300 mg | INHALATION_SOLUTION | Freq: Three times a day (TID) | RESPIRATORY_TRACT | Status: DC
  Administered 2017-08-19 – 2017-08-22 (×11): 0.63 mg via RESPIRATORY_TRACT
  Filled 2017-08-19 (×11): qty 3

## 2017-08-19 MED ORDER — BISACODYL 5 MG PO TBEC
10.0000 mg | DELAYED_RELEASE_TABLET | Freq: Every day | ORAL | Status: DC
  Administered 2017-08-19 – 2017-08-21 (×3): 10 mg via ORAL
  Filled 2017-08-19 (×3): qty 2

## 2017-08-19 MED ORDER — MORPHINE SULFATE (PF) 4 MG/ML IV SOLN
2.0000 mg | INTRAVENOUS | Status: DC | PRN
  Administered 2017-08-19: 4 mg via INTRAVENOUS
  Filled 2017-08-19: qty 1

## 2017-08-19 MED ORDER — SODIUM CHLORIDE 0.9% FLUSH: 10.0000 mL | INTRAVENOUS | Status: DC | PRN

## 2017-08-19 MED ORDER — SODIUM CHLORIDE 0.45 % IV SOLN
INTRAVENOUS | Status: DC | PRN
  Administered 2017-08-19: 02:00:00 via INTRAVENOUS

## 2017-08-19 MED ORDER — ASPIRIN EC 81 MG PO TBEC: 81.0000 mg | DELAYED_RELEASE_TABLET | Freq: Every day | ORAL | Status: DC

## 2017-08-19 MED ORDER — PANTOPRAZOLE SODIUM 20 MG PO TBEC: 20.0000 mg | DELAYED_RELEASE_TABLET | Freq: Every day | ORAL | Status: DC

## 2017-08-19 MED ORDER — SODIUM CHLORIDE 0.9% FLUSH
3.0000 mL | Freq: Two times a day (BID) | INTRAVENOUS | Status: DC
  Administered 2017-08-19 – 2017-08-21 (×4): 3 mL via INTRAVENOUS

## 2017-08-19 MED ORDER — ENOXAPARIN SODIUM 40 MG/0.4ML ~~LOC~~ SOLN: 40.0000 mg | Freq: Every day | SUBCUTANEOUS | Status: DC

## 2017-08-19 MED ORDER — ACETAMINOPHEN 160 MG/5ML PO SOLN: 650.0000 mg | Freq: Once | ORAL | Status: AC

## 2017-08-19 MED ORDER — MIDAZOLAM HCL 2 MG/2ML IJ SOLN: 2.0000 mg | INTRAMUSCULAR | Status: DC | PRN

## 2017-08-19 MED ORDER — ACETAMINOPHEN 325 MG PO TABS
650.0000 mg | ORAL_TABLET | ORAL | Status: DC | PRN
Start: 2017-08-19 — End: 2017-08-19

## 2017-08-19 MED ORDER — INSULIN ASPART 100 UNIT/ML ~~LOC~~ SOLN
0.0000 [IU] | SUBCUTANEOUS | Status: DC
  Administered 2017-08-19: 2 [IU] via SUBCUTANEOUS
  Administered 2017-08-19: 4 [IU] via SUBCUTANEOUS
  Administered 2017-08-19 (×2): 2 [IU] via SUBCUTANEOUS

## 2017-08-19 MED ORDER — NITROGLYCERIN IN D5W 200-5 MCG/ML-% IV SOLN: 0.0000 ug/min | INTRAVENOUS | Status: DC

## 2017-08-19 MED ORDER — CHLORHEXIDINE GLUCONATE 0.12 % MT SOLN
15.0000 mL | OROMUCOSAL | Status: AC
  Administered 2017-08-19: 15 mL via OROMUCOSAL

## 2017-08-19 MED ORDER — MAGNESIUM OXIDE 400 (241.3 MG) MG PO TABS
200.0000 mg | ORAL_TABLET | Freq: Every day | ORAL | Status: DC
  Administered 2017-08-19 – 2017-08-24 (×6): 200 mg via ORAL
  Filled 2017-08-19 (×6): qty 1

## 2017-08-19 MED ORDER — SODIUM CHLORIDE 0.9 % IV SOLN: INTRAVENOUS | Status: DC

## 2017-08-19 MED ORDER — METOPROLOL TARTRATE 25 MG/10 ML ORAL SUSPENSION: 12.5000 mg | Freq: Two times a day (BID) | ORAL | Status: DC

## 2017-08-19 MED ORDER — ACETAMINOPHEN 500 MG PO TABS
1000.0000 mg | ORAL_TABLET | Freq: Four times a day (QID) | ORAL | Status: DC
  Administered 2017-08-19 – 2017-08-21 (×7): 1000 mg via ORAL
  Filled 2017-08-19 (×8): qty 2

## 2017-08-19 MED ORDER — CHLORHEXIDINE GLUCONATE CLOTH 2 % EX PADS
6.0000 | MEDICATED_PAD | Freq: Every day | CUTANEOUS | Status: DC
  Administered 2017-08-19 – 2017-08-21 (×3): 6 via TOPICAL

## 2017-08-19 MED ORDER — ALBUTEROL SULFATE (2.5 MG/3ML) 0.083% IN NEBU: 3.0000 mL | INHALATION_SOLUTION | Freq: Four times a day (QID) | RESPIRATORY_TRACT | Status: DC | PRN

## 2017-08-19 MED ORDER — ALBUMIN HUMAN 5 % IV SOLN
250.0000 mL | INTRAVENOUS | Status: AC | PRN
  Administered 2017-08-19 (×2): 250 mL via INTRAVENOUS

## 2017-08-19 MED ORDER — SODIUM CHLORIDE 0.9 % IV SOLN: 250.0000 mL | INTRAVENOUS | Status: DC

## 2017-08-19 MED ORDER — INSULIN REGULAR BOLUS VIA INFUSION
0.0000 [IU] | Freq: Three times a day (TID) | INTRAVENOUS | Status: DC
  Filled 2017-08-19: qty 10

## 2017-08-19 MED ORDER — METOCLOPRAMIDE HCL 5 MG/ML IJ SOLN
10.0000 mg | Freq: Four times a day (QID) | INTRAMUSCULAR | Status: AC
  Administered 2017-08-19 – 2017-08-20 (×4): 10 mg via INTRAVENOUS
  Filled 2017-08-19 (×3): qty 2

## 2017-08-19 MED ORDER — ACETAMINOPHEN 650 MG RE SUPP
650.0000 mg | Freq: Once | RECTAL | Status: AC
  Administered 2017-08-19: 650 mg via RECTAL

## 2017-08-19 MED ORDER — ALBUMIN HUMAN 5 % IV SOLN
INTRAVENOUS | Status: DC | PRN
Start: 2017-08-19 — End: 2017-08-19
  Administered 2017-08-19: via INTRAVENOUS

## 2017-08-19 MED ORDER — VANCOMYCIN HCL IN DEXTROSE 1-5 GM/200ML-% IV SOLN
1000.0000 mg | Freq: Once | INTRAVENOUS | Status: AC
  Administered 2017-08-19: 1000 mg via INTRAVENOUS
  Filled 2017-08-19: qty 200

## 2017-08-19 MED ORDER — METOPROLOL TARTRATE 5 MG/5ML IV SOLN
2.5000 mg | INTRAVENOUS | Status: DC | PRN
  Administered 2017-08-19: 5 mg via INTRAVENOUS

## 2017-08-19 MED ORDER — NITROGLYCERIN 0.4 MG SL SUBL: 0.4000 mg | SUBLINGUAL_TABLET | SUBLINGUAL | Status: DC | PRN

## 2017-08-19 MED ORDER — ASPIRIN EC 81 MG PO TBEC
81.0000 mg | DELAYED_RELEASE_TABLET | Freq: Every day | ORAL | Status: DC
  Administered 2017-08-19 – 2017-08-24 (×5): 81 mg via ORAL
  Filled 2017-08-19 (×6): qty 1

## 2017-08-19 MED ORDER — DEXMEDETOMIDINE HCL IN NACL 200 MCG/50ML IV SOLN
0.0000 ug/kg/h | INTRAVENOUS | Status: DC
  Filled 2017-08-19: qty 50

## 2017-08-19 MED ORDER — MAGNESIUM SULFATE 4 GM/100ML IV SOLN
4.0000 g | Freq: Once | INTRAVENOUS | Status: AC
  Administered 2017-08-19: 4 g via INTRAVENOUS
  Filled 2017-08-19: qty 100

## 2017-08-19 MED ORDER — ONDANSETRON HCL 4 MG/2ML IJ SOLN
4.0000 mg | Freq: Four times a day (QID) | INTRAMUSCULAR | Status: DC | PRN
  Administered 2017-08-19: 4 mg via INTRAVENOUS
  Filled 2017-08-19: qty 2

## 2017-08-19 MED ORDER — FAMOTIDINE IN NACL 20-0.9 MG/50ML-% IV SOLN
20.0000 mg | Freq: Two times a day (BID) | INTRAVENOUS | Status: AC
  Administered 2017-08-19 (×2): 20 mg via INTRAVENOUS
  Filled 2017-08-19: qty 50

## 2017-08-19 MED ORDER — LACTATED RINGERS IV SOLN: 500.0000 mL | Freq: Once | INTRAVENOUS | Status: DC | PRN

## 2017-08-19 MED ORDER — CHLORHEXIDINE GLUCONATE 0.12% ORAL RINSE (MEDLINE KIT): 15.0000 mL | Freq: Two times a day (BID) | OROMUCOSAL | Status: DC

## 2017-08-19 MED ORDER — PANTOPRAZOLE SODIUM 40 MG PO TBEC
40.0000 mg | DELAYED_RELEASE_TABLET | Freq: Every day | ORAL | Status: DC
  Administered 2017-08-20 – 2017-08-21 (×2): 40 mg via ORAL
  Filled 2017-08-19 (×2): qty 1

## 2017-08-19 MED ORDER — ASPIRIN 81 MG PO CHEW: 324.0000 mg | CHEWABLE_TABLET | Freq: Every day | ORAL | Status: DC

## 2017-08-19 MED ORDER — METOPROLOL TARTRATE 12.5 MG HALF TABLET
12.5000 mg | ORAL_TABLET | Freq: Two times a day (BID) | ORAL | Status: DC
  Administered 2017-08-19 – 2017-08-21 (×5): 12.5 mg via ORAL
  Filled 2017-08-19 (×6): qty 1

## 2017-08-19 MED ORDER — DICYCLOMINE HCL 10 MG PO CAPS
10.0000 mg | ORAL_CAPSULE | Freq: Two times a day (BID) | ORAL | Status: DC
  Administered 2017-08-19 – 2017-08-24 (×11): 10 mg via ORAL
  Filled 2017-08-19 (×12): qty 1

## 2017-08-19 MED ORDER — SODIUM CHLORIDE 0.9% FLUSH
10.0000 mL | Freq: Two times a day (BID) | INTRAVENOUS | Status: DC
  Administered 2017-08-19 – 2017-08-21 (×5): 10 mL

## 2017-08-19 MED ORDER — ACETAMINOPHEN 160 MG/5ML PO SOLN: 1000.0000 mg | Freq: Four times a day (QID) | ORAL | Status: DC

## 2017-08-19 MED ORDER — DEXTROSE 5 % IV SOLN
1.5000 g | Freq: Two times a day (BID) | INTRAVENOUS | Status: AC
  Administered 2017-08-19 – 2017-08-21 (×4): 1.5 g via INTRAVENOUS
  Filled 2017-08-19 (×4): qty 1.5

## 2017-08-19 MED ORDER — SODIUM CHLORIDE 0.9 % IV SOLN
0.0000 ug/min | INTRAVENOUS | Status: DC
  Filled 2017-08-19: qty 2

## 2017-08-19 MED ORDER — POTASSIUM CHLORIDE 10 MEQ/50ML IV SOLN
10.0000 meq | INTRAVENOUS | Status: AC
  Administered 2017-08-19 (×3): 10 meq via INTRAVENOUS

## 2017-08-19 MED ORDER — PROTAMINE SULFATE 10 MG/ML IV SOLN
INTRAVENOUS | Status: DC | PRN
  Administered 2017-08-19: 160 mg via INTRAVENOUS

## 2017-08-19 MED ORDER — SODIUM CHLORIDE 0.9% FLUSH: 3.0000 mL | INTRAVENOUS | Status: DC | PRN

## 2017-08-19 NOTE — Progress Notes (Signed)
   Performed courtesy visit per Dr. Kennon Holter request. Pt required emergency CABG overnight due to development of worsening unstable angina and EKGs changes post cath. She is resting comfortably and has been successfully extubated. NSR on tele. RN reports she has been doing well. I notified pt that Dr. Gwenlyn Found was made aware that pt was taken to the OR last PM and that [facility] will continue to follow along.   Lyda Jester, PA-C 08/19/2017

## 2017-08-19 NOTE — Progress Notes (Signed)
Rapid wean protocol terminated at this time due to pt not being able to hold eyes open and poor outcome on NIF and VC. RT and RN will try again when pt more awake

## 2017-08-19 NOTE — Progress Notes (Signed)
1 Day Post-Op Procedure(s) (LRB): CORONARY ARTERY BYPASS GRAFTING (CABG) x 3, USING LEFT INTERNAL MAMMARY ARTERY AND RIGHT GREATER SAPHENOUS VEIN HARVESTED ENDOSCOPICALLY (N/A) TRANSESOPHAGEAL ECHOCARDIOGRAM (TEE) (N/A) Subjective: Intubated but alert  Objective: Vital signs in last 24 hours: Temp:  [95.2 F (35.1 C)-98.6 F (37 C)] 98.1 F (36.7 C) (02/08 0700) Pulse Rate:  [73-179] 87 (02/08 0700) Cardiac Rhythm: Normal sinus rhythm (02/08 0400) Resp:  [9-31] 19 (02/08 0700) BP: (88-176)/(59-104) 122/72 (02/08 0700) SpO2:  [0 %-100 %] 98 % (02/08 0700) Arterial Line BP: (92-139)/(52-75) 122/64 (02/08 0700) FiO2 (%):  [40 %-50 %] 40 % (02/08 0647) Weight:  [115 lb (52.2 kg)-126 lb 15.8 oz (57.6 kg)] 126 lb 15.8 oz (57.6 kg) (02/08 0500)  Hemodynamic parameters for last 24 hours: PAP: (18-29)/(11-19) 24/11 CO:  [2.3 L/min-3.3 L/min] 3.3 L/min CI:  [1.5 L/min/m2-2.3 L/min/m2] 2.3 L/min/m2  Intake/Output from previous day: 02/07 0701 - 02/08 0700 In: 2141.8 [I.V.:591.8; Blood:450; IV [id number]:1740] Out: 2985 [Urine:1935; Blood:950; Chest Tube:100] Intake/Output this shift: No intake/output data recorded.  General appearance: alert, cooperative and no distress Neurologic: intact Heart: regular rate and rhythm Lungs: clear to auscultation bilaterally Abdomen: mildly distended, nontender, tympanitic  Lab Results: Recent Labs    08/19/17 0130 08/19/17 0601  WBC 17.3* 13.0*  HGB 12.8 13.5  HCT 38.0 40.2  PLT 104* 112*   BMET:  Recent Labs    08/16/17 1556  08/19/17 0012 08/19/17 0120 08/19/17 0601  NA 141   < > 140 142 136  K 4.6   < > 3.6 3.3* 5.2*  CL 102   < > 103  --  108  CO2 23  --   --   --  18*  GLUCOSE 95   < > 120* 118* 168*  BUN 12   < > 6  --  7  CREATININE 0.74   < > 0.40*  --  0.64  CALCIUM 10.2  --   --   --  7.5*   < > = values in this interval not displayed.    PT/INR:  Recent Labs    08/19/17 0130  LABPROT 18.1*  INR 1.51   ABG     Component Value Date/Time   PHART 7.322 (L) 08/19/2017 0142   HCO3 20.7 08/19/2017 0142   TCO2 22 08/19/2017 0142   ACIDBASEDEF 6.0 (H) 08/19/2017 0142   O2SAT 98.0 08/19/2017 0142   CBG (last 3)  Recent Labs    08/19/17 0203 08/19/17 0300 08/19/17 0400  GLUCAP 119* 112* 107*    Assessment/Plan: S/P Procedure(s) (LRB): CORONARY ARTERY BYPASS GRAFTING (CABG) x 3, USING LEFT INTERNAL MAMMARY ARTERY AND RIGHT GREATER SAPHENOUS VEIN HARVESTED ENDOSCOPICALLY (N/A) TRANSESOPHAGEAL ECHOCARDIOGRAM (TEE) (N/A) POD # 1  CV- good hemodynamics- dc swan and A line  ASA, statin, beta blocker  RESP- extubate this AM, continue bronchodilators  IS  RENAL- creatinine and lytes OK  ENDO- CBG well controlled, off insulin drip, Q4 SSI  GI- gastric dilatation with OG in place- decompress prior to extubation  SCD + enoxaparin for DVT prophylaxis   LOS: 1 day    Melrose Nakayama 08/19/2017

## 2017-08-19 NOTE — Transfer of Care (Signed)
Immediate Anesthesia Transfer of Care Note  Patient: Sarah Pena  Procedure(s) Performed: CORONARY ARTERY BYPASS GRAFTING (CABG) x 3, USING LEFT INTERNAL MAMMARY ARTERY AND RIGHT GREATER SAPHENOUS VEIN HARVESTED ENDOSCOPICALLY (N/A Chest) TRANSESOPHAGEAL ECHOCARDIOGRAM (TEE) (N/A )  Patient Location: SICU  Anesthesia Type:General  Level of Consciousness: Patient remains intubated per anesthesia plan  Airway & Oxygen Therapy: Patient placed on Ventilator (see vital sign flow sheet for setting)  Post-op Assessment: Report given to RN and Post -op Vital signs reviewed and stable  Post vital signs: Reviewed and stable  Last Vitals:  Vitals:   08/18/17 1915 08/18/17 1930  BP: (!) 116/59 138/68  Pulse: 73 79  Resp:    Temp:    SpO2: 98% 98%    Last Pain:  Vitals:   08/18/17 1850  TempSrc:   PainSc: 2       Patients Stated Pain Goal: 3 (49/17/91 5056)  Complications: No apparent anesthesia complications

## 2017-08-19 NOTE — Progress Notes (Signed)
Patient ID: Sarah Pena, female   DOB: [birth year], 77 y.o.   MRN: [id number] EVENING ROUNDS NOTE :     [address]       [place] [address]             [phone]                 1 Day Post-Op Procedure(s) (LRB): CORONARY ARTERY BYPASS GRAFTING (CABG) x 3, USING LEFT INTERNAL MAMMARY ARTERY AND RIGHT GREATER SAPHENOUS VEIN HARVESTED ENDOSCOPICALLY (N/A) TRANSESOPHAGEAL ECHOCARDIOGRAM (TEE) (N/A)  Total Length of Stay:  LOS: 1 day  BP 106/63   Pulse 89   Temp 97.8 F (36.6 C) (Oral)   Resp 19   Ht 5' (1.524 m)   Wt 126 lb 15.8 oz (57.6 kg)   SpO2 96%   BMI 24.80 kg/m   .Intake/Output      02/08 0701 - 02/09 0700   P.O. 50   I.V. (mL/kg) 369 (6.4)   Blood    IV Piggyback 150   Total Intake(mL/kg) 569 (9.9)   Urine (mL/kg/hr) 550 (0.7)   Blood    Chest Tube 70   Total Output 620   Net -51         . sodium chloride Stopped (08/19/17 0800)  . sodium chloride    . sodium chloride Stopped (08/19/17 0500)  . sodium chloride Stopped (08/19/17 0700)  . cefUROXime (ZINACEF)  IV Stopped (08/19/17 1400)  . lactated ringers    . lactated ringers Stopped (08/19/17 1500)  . lactated ringers 20 mL/hr at 08/19/17 0700  . nitroGLYCERIN 20 mcg/min (08/19/17 1000)  . phenylephrine (NEO-SYNEPHRINE) Adult infusion Stopped (08/19/17 0445)     Lab Results  Component Value Date   WBC 14.1 (H) 08/19/2017   HGB 13.3 08/19/2017   HCT 39.0 08/19/2017   PLT 133 (L) 08/19/2017   GLUCOSE 163 (H) 08/19/2017   ALT 17 10/29/2014   AST 24 10/29/2014   NA 138 08/19/2017   K 3.8 08/19/2017   CL 102 08/19/2017   CREATININE 0.40 (L) 08/19/2017   BUN 6 08/19/2017   CO2 18 (L) 08/19/2017   TSH 2.800 08/16/2017   INR 1.51 08/19/2017    Stable day To get aline out  Grace Isaac MD  Beeper [phone] Office [phone] 08/19/2017 9:00 PM

## 2017-08-19 NOTE — Progress Notes (Signed)
Pt too sleepy to adequately perform respiratory mechanics at this time. Will re-attempt wean at approximately 0630. Will continue to monitor.  Sherlie Ban, RN

## 2017-08-19 NOTE — Progress Notes (Signed)
Rapid Wean protocol started at this time

## 2017-08-19 NOTE — Anesthesia Postprocedure Evaluation (Signed)
Anesthesia Post Note  Patient: Sarah Pena  Procedure(s) Performed: CORONARY ARTERY BYPASS GRAFTING (CABG) x 3, USING LEFT INTERNAL MAMMARY ARTERY AND RIGHT GREATER SAPHENOUS VEIN HARVESTED ENDOSCOPICALLY (N/A Chest) TRANSESOPHAGEAL ECHOCARDIOGRAM (TEE) (N/A )     Patient location during evaluation: SICU Anesthesia Type: General Level of consciousness: sedated Pain management: pain level controlled Vital Signs Assessment: post-procedure vital signs reviewed and stable Respiratory status: patient remains intubated per anesthesia plan Cardiovascular status: stable Postop Assessment: no apparent nausea or vomiting Anesthetic complications: no    Last Vitals:  Vitals:   08/19/17 0630 08/19/17 0700  BP:  122/72  Pulse: 92 87  Resp: (!) 21 19  Temp: 36.6 C 36.7 C  SpO2: 99% 98%    Last Pain:  Vitals:   08/19/17 0400  TempSrc: Core  PainSc:                  Hitomi Slape

## 2017-08-19 NOTE — Progress Notes (Signed)
Rapid wean attempt #2 initiated at this time. Will continue to closely monitor pt.  Sherlie Ban, RN

## 2017-08-19 NOTE — Care Management Note (Signed)
Case Management Note Marvetta Gibbons RN, BSN Unit 4E-Case Manager-- [place] coverage [phone]  Patient Details  Name: Sarah Pena MRN: [id number] Date of Birth: 05-16-1941  Subjective/Objective:  Pt admitted with chest pain- s/p emergency CABGx3 overnight    Action/Plan: PTA pt lived at home with spouse- CM to follow for transition of care needs.   Expected Discharge Date:                 Expected Discharge Plan:  Home/Self Care  In-House Referral:     Discharge planning Services  CM Consult  Post Acute Care Choice:    Choice offered to:     DME Arranged:    DME Agency:     HH Arranged:    HH Agency:     Status of Service:  In process, will continue to follow  If discussed at Long Length of Stay Meetings, dates discussed:    Discharge Disposition:   Additional Comments:  Dawayne Patricia, RN 08/19/2017, 10:20 AM

## 2017-08-19 NOTE — Procedures (Signed)
Extubation Procedure Note  Patient Details:   Name: Sarah Pena DOB: 1940-08-18 MRN: [id number]   Airway Documentation:  Airway 7 mm (Active)  Secured at (cm) 22 cm 08/19/2017  3:57 AM  Measured From Lips 08/19/2017  3:57 AM  Secured Location Right 08/19/2017  3:57 AM  Secured By Rana Snare Tape 08/19/2017  3:57 AM  Site Condition Dry 08/19/2017  3:57 AM    Evaluation  O2 sats: stable throughout Complications: No apparent complications Patient did tolerate procedure well. Bilateral Breath Sounds: Clear, Diminished   Yes  Patient had a NIF of -35 and VC of 800> Extubated patient to 4L nasal canula per MD request and protocol. Patient oriented to time and place.  [name] 08/19/2017, 7:57 AM

## 2017-08-20 ENCOUNTER — Inpatient Hospital Stay (HOSPITAL_COMMUNITY): Payer: Medicare Other

## 2017-08-20 LAB — BASIC METABOLIC PANEL
Anion gap: 9 (ref 5–15)
BUN: 6 mg/dL (ref 6–20)
CO2: 23 mmol/L (ref 22–32)
Calcium: 7.9 mg/dL — ABNORMAL LOW (ref 8.9–10.3)
Chloride: 104 mmol/L (ref 101–111)
Creatinine, Ser: 0.63 mg/dL (ref 0.44–1.00)
GFR calc Af Amer: >60 mL/min (ref >60)
Glucose, Bld: 108 mg/dL — ABNORMAL HIGH (ref 65–99)
POTASSIUM: 3.6 mmol/L (ref 3.5–5.1)
SODIUM: 136 mmol/L (ref 135–145)

## 2017-08-20 LAB — GLUCOSE, CAPILLARY
GLUCOSE-CAPILLARY: 115 mg/dL — AB (ref 65–99)
Glucose-Capillary: 109 mg/dL — ABNORMAL HIGH (ref 65–99)
Glucose-Capillary: 103 mg/dL — ABNORMAL HIGH (ref 65–99)
Glucose-Capillary: 115 mg/dL — ABNORMAL HIGH (ref 65–99)

## 2017-08-20 LAB — CBC
HEMATOCRIT: 39 % (ref 36.0–46.0)
Hemoglobin: 12.8 g/dL (ref 12.0–15.0)
MCH: 29.9 pg (ref 26.0–34.0)
MCHC: 32.8 g/dL (ref 30.0–36.0)
MCV: 91.1 fL (ref 78.0–100.0)
PLATELETS: 127 10*3/uL — AB (ref 150–400)
RBC: 4.28 MIL/uL (ref 3.87–5.11)
RDW: 14.6 % (ref 11.5–15.5)
WBC: 13.5 10*3/uL — AB (ref 4.0–10.5)

## 2017-08-20 MED ORDER — LISINOPRIL 5 MG PO TABS
5.0000 mg | ORAL_TABLET | Freq: Every day | ORAL | Status: DC
  Administered 2017-08-20 – 2017-08-24 (×5): 5 mg via ORAL
  Filled 2017-08-20 (×5): qty 1

## 2017-08-20 MED ORDER — FUROSEMIDE 10 MG/ML IJ SOLN
20.0000 mg | Freq: Every day | INTRAMUSCULAR | Status: DC
  Administered 2017-08-20 – 2017-08-21 (×2): 20 mg via INTRAVENOUS
  Filled 2017-08-20 (×2): qty 2

## 2017-08-20 MED ORDER — ENOXAPARIN SODIUM 30 MG/0.3ML ~~LOC~~ SOLN
30.0000 mg | SUBCUTANEOUS | Status: DC
  Administered 2017-08-20 – 2017-08-23 (×4): 30 mg via SUBCUTANEOUS
  Filled 2017-08-20 (×4): qty 0.3

## 2017-08-20 MED ORDER — POTASSIUM CHLORIDE 10 MEQ/50ML IV SOLN
10.0000 meq | INTRAVENOUS | Status: AC
  Administered 2017-08-20 (×3): 10 meq via INTRAVENOUS
  Filled 2017-08-20: qty 50

## 2017-08-20 MED ORDER — POTASSIUM CHLORIDE 10 MEQ/50ML IV SOLN: 10.0000 meq | INTRAVENOUS | Status: DC | PRN

## 2017-08-20 NOTE — Progress Notes (Signed)
2 Days Post-Op Procedure(s) (LRB): CORONARY ARTERY BYPASS GRAFTING (CABG) x 3, USING LEFT INTERNAL MAMMARY ARTERY AND RIGHT GREATER SAPHENOUS VEIN HARVESTED ENDOSCOPICALLY (N/A) TRANSESOPHAGEAL ECHOCARDIOGRAM (TEE) (N/A) Subjective: Doing well after emerg CABG nsr cxr muld edema Objective: Vital signs in last 24 hours: Temp:  [97.4 F (36.3 C)-98.3 F (36.8 C)] 97.4 F (36.3 C) (02/09 1100) Pulse Rate:  [82-95] 95 (02/09 0900) Cardiac Rhythm: Normal sinus rhythm (02/09 0800) Resp:  [16-26] 25 (02/09 0900) BP: (100-140)/(59-72) 140/72 (02/09 0900) SpO2:  [95 %-99 %] 95 % (02/09 0900) Arterial Line BP: (132-153)/(57-72) 134/61 (02/08 2121) Weight:  [123 lb (55.8 kg)] 123 lb (55.8 kg) (02/09 0500)  Hemodynamic parameters for last 24 hours:    Intake/Output from previous day: 02/08 0701 - 02/09 0700 In: 869 [P.O.:50; I.V.:569; IV Piggyback:250] Out: 1020 [Urine:950; Chest Tube:70] Intake/Output this shift: Total I/O In: 180 [I.V.:80; IV Piggyback:100] Out: -   lungs cleat  Lab Results: Recent Labs    08/19/17 1611 08/19/17 1631 08/20/17 0458  WBC 14.1*  --  13.5*  HGB 13.4 13.3 12.8  HCT 40.0 39.0 39.0  PLT 133*  --  127*   BMET:  Recent Labs    08/19/17 0601  08/19/17 1631 08/20/17 0458  NA 136  --  138 136  K 5.2*  --  3.8 3.6  CL 108  --  102 104  CO2 18*  --   --  23  GLUCOSE 168*  --  163* 108*  BUN 7  --  6 6  CREATININE 0.64   < > 0.40* 0.63  CALCIUM 7.5*  --   --  7.9*   < > = values in this interval not displayed.    PT/INR:  Recent Labs    08/19/17 0130  LABPROT 18.1*  INR 1.51   ABG    Component Value Date/Time   PHART 7.288 (L) 08/19/2017 0707   HCO3 18.6 (L) 08/19/2017 0707   TCO2 23 08/19/2017 1631   ACIDBASEDEF 8.0 (H) 08/19/2017 0707   O2SAT 98.0 08/19/2017 0707   CBG (last 3)  Recent Labs    08/19/17 2324 08/20/17 0308 08/20/17 0754  GLUCAP 106* 115* 109*    Assessment/Plan: S/Pemerg CABG   LOS: 2 days   Mobilize Lasix Low dose lisinopril for preop mi  Sarah Pena 08/20/2017

## 2017-08-20 NOTE — Op Note (Signed)
NAMETOPAZ, RAGLIN                 ACCOUNT NO.:  [id number]  MEDICAL RECORD NO.:  [id number]  LOCATION:                                 FACILITY:  PHYSICIAN:  Revonda Standard. Roxan Hockey, M.D. DATE OF BIRTH:  DATE OF PROCEDURE:  08/18/2017 DATE OF DISCHARGE:                              OPERATIVE REPORT   PREOPERATIVE DIAGNOSIS:  Three-vessel coronary artery disease with unstable chest pain.  POSTOPERATIVE DIAGNOSIS:  Three-vessel coronary artery disease with unstable chest pain.  PROCEDURE:   Median sternotomy, extracorporeal circulation, Coronary artery bypass grafting x 3  Left internal mammary artery to left anterior descending,  Saphenous vein graft to obtuse marginal 1,  Saphenous vein graft to posterior descending, Endoscopic vein harvest right leg.  SURGEON:  Revonda Standard. Roxan Hockey, M.D.  ASSISTANT:  Nicholes Rough, PA.  ANESTHESIA:  General.  FINDINGS:  Mammary would not reach LAD as a pedicle graft.  Therefore, it was used as a free graft taken off as a Y from the saphenous vein to the OM vein.  Good quality targets.  Preserved left ventricular function by transesophageal echocardiogram.  CLINICAL NOTE:  Sarah Pena is a 77 year old woman with multiple cardiac risk factors, who presented with progressively increasing severity and frequency of chest pain with exertion.  She underwent cardiac catheterization earlier today, which revealed a critical ostial LAD stenosis and 3-vessel coronary artery disease.  In the post catheterization holding area, she developed chest pain and had severe T- wave inversions on the EKG.  Her chest pain improved with nitroglycerin, but the T-wave inversions persisted.  It was felt safest to proceed with coronary artery bypass grafting on emergent basis for myocardial preservation and survival benefit.  The indications, risks, benefits, and alternatives were discussed in detail with the patient.  She understood and accepted the risks and agreed  to proceed.  DESCRIPTION OF PROCEDURE:  Sarah Pena was brought from the post catheterization holding area to the operating room on August 18, 2017. Dr. Oleta Mouse of Anesthesia placed a Swan-Ganz catheter and an arterial blood pressure monitoring line.  Intravenous antibiotics were administered.  She was anesthetized and intubated.  A Foley catheter was placed.  Transesophageal echocardiography was performed.  It revealed preserved left ventricular wall motion.  There was mild AI and mild MR. The chest, abdomen, and legs were prepped and draped in usual sterile fashion.  Median sternotomy was performed and the left internal mammary artery was harvested using standard technique.  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  There also was marked COPD with hyperinflation of the lungs.  Simultaneously with the mammary harvest, an incision was made in the medial aspect of the right leg just below the knee.  The greater saphenous vein was identified and was harvested from there to the groin endoscopically.  2000 units of heparin was administered during the vessel harvest.  After the vein was removed from the leg, the remainder of the full heparin dose was given.  There was excellent flow through the mammary artery.  However, it would not reach the heart due to the extremely low orientation of the heart in the chest as a consequence of the COPD.  Therefore, the decision was made to use the left mammary as a free graft.  It was divided proximally and the proximal stump was suture ligated.  After harvesting the conduits, the remainder of the full heparin dose was given. A sternal retractor was placed.  The pericardium was opened.  The ascending aorta was inspected.  It was relatively elongated, but was of normal diameter.  There was no evidence of atherosclerotic disease. After confirming adequate anticoagulation with ACT measurement, the aorta was cannulated via concentric 2-0  Ethibond pledgeted pursestring sutures.  A dual stage venous cannula was placed via a pursestring suture in the right atrial appendage.  Cardiopulmonary bypass was initiated. Flows were maintained per protocol.  The patient was cooled to 34 degrees Celsius.  The coronary arteries were inspected and anastomotic sites were chosen.  The conduits were inspected and cut to length.  A foam pad was placed in the pericardium to insulate the heart.  A temperature probe was placed in the myocardial septum and a cardioplegia cannula was placed in the ascending aorta.  The aorta was crossclamped.  The left ventricle was emptied via the aortic root vent.  Cardiac arrest then was achieved with a combination of cold antegrade blood cardioplegia and topical iced saline.  1 L of cardioplegia was administered.  There was a rapid diastolic arrest and there was septal cooling to 10 degrees Celsius.  A reversed saphenous vein graft was placed end-to-side to the posterior descending branch of the right coronary artery.  This was placed near its origin.  It was a 1.5-mm good quality target.  The vein was of excellent quality.  The end-to-side anastomosis was performed with a running 7-0 Prolene suture.  All anastomoses were probed proximally and distally at their completion to ensure patency prior to tying the suture.  Cardioplegia was administered down the vein graft.  There was good flow and there was good hemostasis.  A reversed saphenous vein graft was placed end-to-side to OM 1.  This was a 1.5-mm good quality target.  The vein was of good quality.  The anastomosis was performed with a running 7-0 Prolene suture.  A probe passed easily proximally and distally.  Cardioplegia was administered. There was good flow through the graft and good hemostasis at the anastomosis.  Additional cardioplegia was also administered via the aortic root.  The heart then was elevated exposing the distal LAD.  It was a  1.5-mm good quality target.  The mammary was a 1.5-mm good quality conduit.  An end-to-side anastomosis was performed with a running 8-0 Prolene suture. At the completion of the anastomosis, a probe passed easily proximally and distally.  Additional cardioplegia was administered via the aortic root and the vein grafts and there was good backbleeding from the mammary artery.  The vein grafts were cut to length.  The cardioplegia cannula was removed from the ascending aorta.  The proximal vein graft anastomoses were performed to 4.5 mm punch aortotomies with running 6-0 Prolene sutures.  At the completion of the second proximal anastomosis, the patient was placed in Trendelenburg position.  Lidocaine was administered.  The aortic root was de-aired and the aortic crossclamp was removed.  Total crossclamp time was 60 minutes.  The patient spontaneously resumed a sinus rhythm and did not require defibrillation. The proximal end of the mammary artery was beveled. Bulldog clamps were placed proximally and distally on the OM vein graft.  A longitudinal venotomy was made and an end-to-side anastomosis was performed with a  running 7-0 Prolene suture.  The graft was de-aired from the distal end first before tying the suture.  While rewarming was completed, all proximal and distal anastomoses were inspected for hemostasis.  Epicardial pacing wires were placed on the right ventricle and right atrium.  When the patient had rewarmed to a core temperature of 37 degrees Celsius, he was weaned from cardiopulmonary bypass on the first attempt.  Total bypass time was 108 minutes.  The initial cardiac index was 1.8 L/min/m2.  The patient remained hemodynamically stable throughout the post-bypass period.  A test dose of protamine was administered and was well tolerated.  The atrial and aortic cannula were removed.  The remainder of the protamine was administered without incident.  The chest was irrigated  with warm saline.  Hemostasis was achieved.  The pericardium was not closed.  Left pleural and mediastinal chest tubes were placed through separate subcostal incisions.  The sternum was closed with a combination of single and double heavy gauge stainless steel wires.  There was no change in heart rate, blood pressure, or pulmonary artery pressures. There was a decrease in the cardiac index, although the PA pressures were relatively low with a PAD of only 11 at the time of closure.  The cardiac index improved with volume administration.  The pectoralis fascia, subcutaneous tissue, and skin were closed in standard fashion. All sponge, needle, and instrument counts were correct at the end of the procedure.  The patient was taken from the operating room to the Surgical Intensive Care Unit in fair condition.     Revonda Standard Roxan Hockey, M.D.     SCH/MEDQ  D:  08/19/2017  T:  08/20/2017  Job:  [id number]

## 2017-08-20 NOTE — Plan of Care (Signed)
Pt is currently excreting > 30cc of urine an hour w/o assistance of a diuretic.

## 2017-08-20 NOTE — Progress Notes (Signed)
Patient ID: Sarah Pena, female   DOB: 1941-04-17, 77 y.o.   MRN: [id number] EVENING ROUNDS NOTE :     [address]       [place] [address]             [phone]                 2 Days Post-Op Procedure(s) (LRB): CORONARY ARTERY BYPASS GRAFTING (CABG) x 3, USING LEFT INTERNAL MAMMARY ARTERY AND RIGHT GREATER SAPHENOUS VEIN HARVESTED ENDOSCOPICALLY (N/A) TRANSESOPHAGEAL ECHOCARDIOGRAM (TEE) (N/A)  Total Length of Stay:  LOS: 2 days  BP 127/70   Pulse 88   Temp (!) 97.4 F (36.3 C) (Oral)   Resp (!) 22   Ht 5' (1.524 m)   Wt 123 lb (55.8 kg)   SpO2 95%   BMI 24.02 kg/m   .Intake/Output      02/09 0701 - 02/10 0700   P.O.    I.V. (mL/kg) 160 (2.9)   IV Piggyback 150   Total Intake(mL/kg) 310 (5.6)   Urine (mL/kg/hr) 1175 (1.6)   Chest Tube    Total Output 1175   Net -865         . sodium chloride Stopped (08/19/17 0800)  . sodium chloride    . sodium chloride Stopped (08/19/17 0500)  . sodium chloride Stopped (08/19/17 0700)  . cefUROXime (ZINACEF)  IV Stopped (08/20/17 1237)  . lactated ringers Stopped (08/19/17 1500)  . lactated ringers Stopped (08/20/17 1200)  . phenylephrine (NEO-SYNEPHRINE) Adult infusion Stopped (08/19/17 0445)     Lab Results  Component Value Date   WBC 13.5 (H) 08/20/2017   HGB 12.8 08/20/2017   HCT 39.0 08/20/2017   PLT 127 (L) 08/20/2017   GLUCOSE 108 (H) 08/20/2017   ALT 17 10/29/2014   AST 24 10/29/2014   NA 136 08/20/2017   K 3.6 08/20/2017   CL 104 08/20/2017   CREATININE 0.63 08/20/2017   BUN 6 08/20/2017   CO2 23 08/20/2017   TSH 2.800 08/16/2017   INR 1.51 08/19/2017   Stable day   Grace Isaac MD  Beeper [phone] Office [phone] 08/20/2017 7:50 PM

## 2017-08-21 ENCOUNTER — Encounter (HOSPITAL_COMMUNITY): Payer: Self-pay | Admitting: *Deleted

## 2017-08-21 ENCOUNTER — Inpatient Hospital Stay (HOSPITAL_COMMUNITY): Payer: Medicare Other

## 2017-08-21 ENCOUNTER — Other Ambulatory Visit: Payer: Self-pay

## 2017-08-21 LAB — BASIC METABOLIC PANEL
Anion gap: 10 (ref 5–15)
BUN: 10 mg/dL (ref 6–20)
CO2: 22 mmol/L (ref 22–32)
Calcium: 8.1 mg/dL — ABNORMAL LOW (ref 8.9–10.3)
Chloride: 103 mmol/L (ref 101–111)
Creatinine, Ser: 0.5 mg/dL (ref 0.44–1.00)
GFR calc Af Amer: >60 mL/min (ref >60)
GFR calc non Af Amer: >60 mL/min (ref >60)
Glucose, Bld: 94 mg/dL (ref 65–99)
Potassium: 4 mmol/L (ref 3.5–5.1)
Sodium: 135 mmol/L (ref 135–145)

## 2017-08-21 LAB — GLUCOSE, CAPILLARY
GLUCOSE-CAPILLARY: 102 mg/dL — AB (ref 65–99)
GLUCOSE-CAPILLARY: 86 mg/dL (ref 65–99)
GLUCOSE-CAPILLARY: 91 mg/dL (ref 65–99)
Glucose-Capillary: 91 mg/dL (ref 65–99)
Glucose-Capillary: 84 mg/dL (ref 65–99)

## 2017-08-21 LAB — CBC
HCT: 39.4 % (ref 36.0–46.0)
Hemoglobin: 13.3 g/dL (ref 12.0–15.0)
MCH: 31.3 pg (ref 26.0–34.0)
MCHC: 33.8 g/dL (ref 30.0–36.0)
MCV: 92.7 fL (ref 78.0–100.0)
Platelets: 124 10*3/uL — ABNORMAL LOW (ref 150–400)
RBC: 4.25 MIL/uL (ref 3.87–5.11)
RDW: 14.6 % (ref 11.5–15.5)
WBC: 13.5 10*3/uL — ABNORMAL HIGH (ref 4.0–10.5)

## 2017-08-21 MED ORDER — BISACODYL 10 MG RE SUPP: 10.0000 mg | Freq: Every day | RECTAL | Status: DC | PRN

## 2017-08-21 MED ORDER — SODIUM CHLORIDE 0.9 % IV SOLN: 250.0000 mL | INTRAVENOUS | Status: DC | PRN

## 2017-08-21 MED ORDER — TRAMADOL HCL 50 MG PO TABS
50.0000 mg | ORAL_TABLET | Freq: Four times a day (QID) | ORAL | Status: DC
  Administered 2017-08-21 – 2017-08-24 (×11): 50 mg via ORAL
  Filled 2017-08-21 (×12): qty 1

## 2017-08-21 MED ORDER — SODIUM CHLORIDE 0.9% FLUSH: 3.0000 mL | INTRAVENOUS | Status: DC | PRN

## 2017-08-21 MED ORDER — SODIUM CHLORIDE 0.9% FLUSH
3.0000 mL | Freq: Two times a day (BID) | INTRAVENOUS | Status: DC
  Administered 2017-08-21 – 2017-08-23 (×4): 3 mL via INTRAVENOUS

## 2017-08-21 MED ORDER — BISACODYL 5 MG PO TBEC: 10.0000 mg | DELAYED_RELEASE_TABLET | Freq: Every day | ORAL | Status: DC | PRN

## 2017-08-21 MED ORDER — INSULIN ASPART 100 UNIT/ML ~~LOC~~ SOLN
0.0000 [IU] | Freq: Three times a day (TID) | SUBCUTANEOUS | Status: DC
  Administered 2017-08-22: 2 [IU] via SUBCUTANEOUS

## 2017-08-21 MED ORDER — ATORVASTATIN CALCIUM 10 MG PO TABS
10.0000 mg | ORAL_TABLET | Freq: Every day | ORAL | Status: DC
  Administered 2017-08-21 – 2017-08-23 (×3): 10 mg via ORAL
  Filled 2017-08-21 (×3): qty 1

## 2017-08-21 MED ORDER — DOCUSATE SODIUM 100 MG PO CAPS
200.0000 mg | ORAL_CAPSULE | Freq: Every day | ORAL | Status: DC
  Administered 2017-08-22 – 2017-08-23 (×2): 200 mg via ORAL
  Filled 2017-08-21 (×2): qty 2

## 2017-08-21 MED ORDER — ONDANSETRON HCL 4 MG/2ML IJ SOLN: 4.0000 mg | Freq: Four times a day (QID) | INTRAMUSCULAR | Status: DC | PRN

## 2017-08-21 MED ORDER — ONDANSETRON HCL 4 MG PO TABS
4.0000 mg | ORAL_TABLET | Freq: Four times a day (QID) | ORAL | Status: DC | PRN
Start: 2017-08-21 — End: 2017-08-24

## 2017-08-21 MED ORDER — MOVING RIGHT ALONG BOOK
Freq: Once | Status: AC
  Administered 2017-08-21: 06:00:00
  Filled 2017-08-21: qty 1

## 2017-08-21 MED ORDER — METOPROLOL TARTRATE 12.5 MG HALF TABLET
12.5000 mg | ORAL_TABLET | Freq: Two times a day (BID) | ORAL | Status: DC
  Administered 2017-08-21 – 2017-08-22 (×2): 12.5 mg via ORAL
  Filled 2017-08-21 (×2): qty 1

## 2017-08-21 MED ORDER — OXYCODONE HCL 5 MG PO TABS: 5.0000 mg | ORAL_TABLET | ORAL | Status: DC | PRN

## 2017-08-21 MED ORDER — ASPIRIN EC 81 MG PO TBEC: 81.0000 mg | DELAYED_RELEASE_TABLET | Freq: Every day | ORAL | Status: DC

## 2017-08-21 MED ORDER — MOVING RIGHT ALONG BOOK
Freq: Once | Status: AC
  Administered 2017-08-21: 12:00:00
  Filled 2017-08-21: qty 1

## 2017-08-21 MED ORDER — ACETAMINOPHEN 325 MG PO TABS: 650.0000 mg | ORAL_TABLET | Freq: Four times a day (QID) | ORAL | Status: DC | PRN

## 2017-08-21 MED ORDER — PANTOPRAZOLE SODIUM 40 MG PO TBEC
40.0000 mg | DELAYED_RELEASE_TABLET | Freq: Every day | ORAL | Status: DC
  Administered 2017-08-22 – 2017-08-24 (×3): 40 mg via ORAL
  Filled 2017-08-21 (×3): qty 1

## 2017-08-21 NOTE — Progress Notes (Signed)
Patient ID: Sarah Pena, female   DOB: 1940-11-06, 77 y.o.   MRN: [id number] TCTS DAILY ICU PROGRESS NOTE                   [address]            [place] [address]          [phone]   3 Days Post-Op Procedure(s) (LRB): CORONARY ARTERY BYPASS GRAFTING (CABG) x 3, USING LEFT INTERNAL MAMMARY ARTERY AND RIGHT GREATER SAPHENOUS VEIN HARVESTED ENDOSCOPICALLY (N/A) TRANSESOPHAGEAL ECHOCARDIOGRAM (TEE) (N/A)  Total Length of Stay:  LOS: 3 days   Subjective: Patient awake alert neurologically intact holding sinus rhythm  Objective: Vital signs in last 24 hours: Temp:  [97.4 F (36.3 C)-98.3 F (36.8 C)] 98.3 F (36.8 C) (02/10 0700) Pulse Rate:  [81-101] 101 (02/10 1000) Cardiac Rhythm: Normal sinus rhythm (02/10 0800) Resp:  [16-27] 26 (02/10 1000) BP: (93-147)/(53-75) 147/75 (02/10 1000) SpO2:  [90 %-97 %] 96 % (02/10 1000) Weight:  [122 lb 5.7 oz (55.5 kg)] 122 lb 5.7 oz (55.5 kg) (02/10 0500)  Filed Weights   08/19/17 0500 08/20/17 0500 08/21/17 0500  Weight: 126 lb 15.8 oz (57.6 kg) 123 lb (55.8 kg) 122 lb 5.7 oz (55.5 kg)    Weight change: -10.3 oz (-0.293 kg)   Hemodynamic parameters for last 24 hours:    Intake/Output from previous day: 02/09 0701 - 02/10 0700 In: 510 [P.O.:150; I.V.:160; IV Piggyback:200] Out: 1525 [Urine:1525]  Intake/Output this shift: No intake/output data recorded.  Current Meds: Scheduled Meds: . acetaminophen  1,000 mg Oral Q6H  . aspirin EC  81 mg Oral Daily  . bisacodyl  10 mg Oral Daily   Or  . bisacodyl  10 mg Rectal Daily  . Chlorhexidine Gluconate Cloth  6 each Topical Daily  . dicyclomine  10 mg Oral BID  . docusate sodium  200 mg Oral Daily  . enoxaparin (LOVENOX) injection  30 mg Subcutaneous Q24H  . furosemide  20 mg Intravenous Daily  . insulin aspart  0-24 Units Subcutaneous Q4H  . levalbuterol  0.63 mg Nebulization TID  . lisinopril  5 mg Oral Daily  . magnesium oxide  200 mg Oral Daily  .  metoprolol tartrate  12.5 mg Oral BID   Or  . metoprolol tartrate  12.5 mg Per Tube BID  . pantoprazole  40 mg Oral Daily  . sodium chloride flush  10-40 mL Intracatheter Q12H  . sodium chloride flush  3 mL Intravenous Q12H  . sodium chloride flush  3 mL Intravenous Q12H   Continuous Infusions: . sodium chloride Stopped (08/19/17 0800)  . sodium chloride    . sodium chloride Stopped (08/19/17 0500)  . sodium chloride Stopped (08/19/17 0700)  . lactated ringers Stopped (08/19/17 1500)  . lactated ringers Stopped (08/20/17 1200)  . phenylephrine (NEO-SYNEPHRINE) Adult infusion Stopped (08/19/17 0445)   PRN Meds:.sodium chloride, sodium chloride, metoprolol tartrate, midazolam, morphine injection, morphine injection, ondansetron (ZOFRAN) IV, oxyCODONE, sodium chloride flush, sodium chloride flush, sodium chloride flush, traMADol  General appearance: alert and cooperative Neurologic: intact Heart: regular rate and rhythm, S1, S2 normal, no murmur, click, rub or gallop Lungs: clear to auscultation bilaterally Abdomen: soft, non-tender; bowel sounds normal; no masses,  no organomegaly Extremities: extremities normal, atraumatic, no cyanosis or edema and Homans sign is negative, no sign of DVT Wound: Sternum stable wound well-healed  Lab Results: CBC: Recent Labs    08/20/17 0458 08/21/17 0257  WBC 13.5* 13.5*  HGB 12.8 13.3  HCT 39.0 39.4  PLT 127* 124*   BMET:  Recent Labs    08/20/17 0458 08/21/17 0257  NA 136 135  K 3.6 4.0  CL 104 103  CO2 23 22  GLUCOSE 108* 94  BUN 6 10  CREATININE 0.63 0.50  CALCIUM 7.9* 8.1*    CMET: Lab Results  Component Value Date   WBC 13.5 (H) 08/21/2017   HGB 13.3 08/21/2017   HCT 39.4 08/21/2017   PLT 124 (L) 08/21/2017   GLUCOSE 94 08/21/2017   ALT 17 10/29/2014   AST 24 10/29/2014   NA 135 08/21/2017   K 4.0 08/21/2017   CL 103 08/21/2017   CREATININE 0.50 08/21/2017   BUN 10 08/21/2017   CO2 22 08/21/2017   TSH 2.800  08/16/2017   INR 1.51 08/19/2017      PT/INR:  Recent Labs    08/19/17 0130  LABPROT 18.1*  INR 1.51   Radiology: Dg Chest 2 View  Result Date: 08/21/2017 CLINICAL DATA:  CABG EXAM: CHEST  2 VIEW COMPARISON:  08/20/2017 FINDINGS: Small bilateral layering effusions with bibasilar atelectasis. No pneumothorax. Heart is normal size. Changes of CABG. IMPRESSION: Small bilateral effusions with bibasilar atelectasis. Electronically Signed   By: Rolm Baptise M.D.   On: 08/21/2017 07:34     Assessment/Plan: S/P Procedure(s) (LRB): CORONARY ARTERY BYPASS GRAFTING (CABG) x 3, USING LEFT INTERNAL MAMMARY ARTERY AND RIGHT GREATER SAPHENOUS VEIN HARVESTED ENDOSCOPICALLY (N/A) TRANSESOPHAGEAL ECHOCARDIOGRAM (TEE) (N/A) Mobilize Diuresis Plan for transfer to step-down: see transfer orders Patient has not been on statin, says she likes to take a holistic approach.  I discussed the pros and cons of statins with her and she is agreeable with starting on low-dose statin, and monitor for side effects    Grace Isaac 08/21/2017 10:44 AM

## 2017-08-22 ENCOUNTER — Encounter (HOSPITAL_COMMUNITY): Payer: Self-pay | Admitting: Family

## 2017-08-22 ENCOUNTER — Inpatient Hospital Stay (HOSPITAL_COMMUNITY): Payer: Medicare Other

## 2017-08-22 LAB — TYPE AND SCREEN
ABO/RH(D): B POS
Antibody Screen: NEGATIVE
UNIT DIVISION: 0
UNIT DIVISION: 0
Unit division: 0
Unit division: 0
Unit division: 0
Unit division: 0

## 2017-08-22 LAB — BPAM RBC
BLOOD PRODUCT EXPIRATION DATE: 201903032359
BLOOD PRODUCT EXPIRATION DATE: 201903052359
Blood Product Expiration Date: 201903022359
Blood Product Expiration Date: 201903022359
Blood Product Expiration Date: 201903032359
Blood Product Expiration Date: 201903062359
ISSUE DATE / TIME: 201902072220
ISSUE DATE / TIME: 201902072220
ISSUE DATE / TIME: 201902072232
ISSUE DATE / TIME: 201902072232
UNIT TYPE AND RH: 7300
UNIT TYPE AND RH: 7300
Unit Type and Rh: 7300
Unit Type and Rh: 7300
Unit Type and Rh: 7300
Unit Type and Rh: 7300

## 2017-08-22 LAB — GLUCOSE, CAPILLARY
GLUCOSE-CAPILLARY: 122 mg/dL — AB (ref 65–99)
GLUCOSE-CAPILLARY: 153 mg/dL — AB (ref 65–99)
GLUCOSE-CAPILLARY: 106 mg/dL — AB (ref 65–99)
Glucose-Capillary: 86 mg/dL (ref 65–99)
Glucose-Capillary: 84 mg/dL (ref 65–99)
Glucose-Capillary: 116 mg/dL — ABNORMAL HIGH (ref 65–99)

## 2017-08-22 LAB — BASIC METABOLIC PANEL
Anion gap: 10 (ref 5–15)
BUN: 14 mg/dL (ref 6–20)
CO2: 25 mmol/L (ref 22–32)
Calcium: 8.4 mg/dL — ABNORMAL LOW (ref 8.9–10.3)
Chloride: 103 mmol/L (ref 101–111)
Creatinine, Ser: 0.56 mg/dL (ref 0.44–1.00)
GFR calc Af Amer: >60 mL/min (ref >60)
GFR calc non Af Amer: >60 mL/min (ref >60)
Glucose, Bld: 90 mg/dL (ref 65–99)
Potassium: 4 mmol/L (ref 3.5–5.1)
Sodium: 138 mmol/L (ref 135–145)

## 2017-08-22 LAB — CBC
HCT: 41.2 % (ref 36.0–46.0)
Hemoglobin: 13.4 g/dL (ref 12.0–15.0)
MCH: 30.3 pg (ref 26.0–34.0)
MCHC: 32.5 g/dL (ref 30.0–36.0)
MCV: 93.2 fL (ref 78.0–100.0)
Platelets: 169 10*3/uL (ref 150–400)
RBC: 4.42 MIL/uL (ref 3.87–5.11)
RDW: 14.4 % (ref 11.5–15.5)
WBC: 12.8 10*3/uL — ABNORMAL HIGH (ref 4.0–10.5)

## 2017-08-22 MED ORDER — METOPROLOL TARTRATE 25 MG PO TABS
25.0000 mg | ORAL_TABLET | Freq: Two times a day (BID) | ORAL | Status: DC
  Administered 2017-08-22: 25 mg via ORAL
  Filled 2017-08-22: qty 1

## 2017-08-22 MED ORDER — FUROSEMIDE 20 MG PO TABS
20.0000 mg | ORAL_TABLET | Freq: Every day | ORAL | Status: DC
  Administered 2017-08-22: 20 mg via ORAL
  Filled 2017-08-22: qty 1

## 2017-08-22 MED ORDER — POTASSIUM CHLORIDE CRYS ER 10 MEQ PO TBCR
10.0000 meq | EXTENDED_RELEASE_TABLET | Freq: Every day | ORAL | Status: DC
  Administered 2017-08-22: 10 meq via ORAL
  Filled 2017-08-22: qty 1

## 2017-08-22 MED ORDER — LEVALBUTEROL HCL 0.63 MG/3ML IN NEBU
0.6300 mg | INHALATION_SOLUTION | Freq: Four times a day (QID) | RESPIRATORY_TRACT | Status: DC | PRN
  Administered 2017-08-22 – 2017-08-23 (×4): 0.63 mg via RESPIRATORY_TRACT
  Filled 2017-08-22 (×3): qty 3

## 2017-08-22 MED ORDER — LEVALBUTEROL HCL 0.63 MG/3ML IN NEBU
INHALATION_SOLUTION | RESPIRATORY_TRACT | Status: AC
  Filled 2017-08-22: qty 3

## 2017-08-22 NOTE — Progress Notes (Addendum)
[address]       [place] [address]             [phone]      4 Days Post-Op Procedure(s) (LRB): CORONARY ARTERY BYPASS GRAFTING (CABG) x 3, USING LEFT INTERNAL MAMMARY ARTERY AND RIGHT GREATER SAPHENOUS VEIN HARVESTED ENDOSCOPICALLY (N/A) TRANSESOPHAGEAL ECHOCARDIOGRAM (TEE) (N/A) Subjective: Feels fair today, having soreness  Objective: Vital signs in last 24 hours: Temp:  [97.4 F (36.3 C)-98.7 F (37.1 C)] 98.7 F (37.1 C) (02/11 0015) Pulse Rate:  [93-110] 102 (02/11 0641) Cardiac Rhythm: Sinus tachycardia (02/11 0701) Resp:  [20-28] 20 (02/11 0641) BP: (103-142)/(64-88) 120/75 (02/11 0015) SpO2:  [93 %-98 %] 95 % (02/11 0931)  Hemodynamic parameters for last 24 hours:    Intake/Output from previous day: 02/10 0701 - 02/11 0700 In: -  Out: 100 [Urine:100] Intake/Output this shift: No intake/output data recorded.  General appearance: alert, cooperative and no distress Heart: regular rate and rhythm Lungs: min dim in bases Abdomen: benign Extremities: minor edema Wound: incis healing well  Lab Results: Recent Labs    08/21/17 0257 08/22/17 0329  WBC 13.5* 12.8*  HGB 13.3 13.4  HCT 39.4 41.2  PLT 124* 169   BMET:  Recent Labs    08/21/17 0257 08/22/17 0329  NA 135 138  K 4.0 4.0  CL 103 103  CO2 22 25  GLUCOSE 94 90  BUN 10 14  CREATININE 0.50 0.56  CALCIUM 8.1* 8.4*    PT/INR: No results for input(s): LABPROT, INR in the last 72 hours. ABG    Component Value Date/Time   PHART 7.288 (L) 08/19/2017 0707   HCO3 18.6 (L) 08/19/2017 0707   TCO2 23 08/19/2017 1631   ACIDBASEDEF 8.0 (H) 08/19/2017 0707   O2SAT 98.0 08/19/2017 0707   CBG (last 3)  Recent Labs    08/21/17 2213 08/22/17 0600 08/22/17 1223  GLUCAP 91 84 153*    Meds Scheduled Meds: . aspirin EC  81 mg Oral Daily  . atorvastatin  10 mg Oral q1800  . dicyclomine  10 mg Oral BID  . docusate sodium  200 mg Oral Daily  . enoxaparin (LOVENOX)  injection  30 mg Subcutaneous Q24H  . insulin aspart  0-24 Units Subcutaneous TID AC & HS  . levalbuterol  0.63 mg Nebulization TID  . lisinopril  5 mg Oral Daily  . magnesium oxide  200 mg Oral Daily  . metoprolol tartrate  12.5 mg Oral BID  . pantoprazole  40 mg Oral QAC breakfast  . sodium chloride flush  3 mL Intravenous Q12H  . traMADol  50 mg Oral Q6H   Continuous Infusions: . sodium chloride     PRN Meds:.sodium chloride, acetaminophen, bisacodyl **OR** bisacodyl, levalbuterol, ondansetron **OR** ondansetron (ZOFRAN) IV, oxyCODONE, sodium chloride flush  Xrays Dg Chest 2 View  Result Date: 08/22/2017 CLINICAL DATA:  Postop check EXAM: CHEST  2 VIEW COMPARISON:  Yesterday FINDINGS: Small pleural effusions and mild atelectasis. Hyperinflation. Status post CABG with normal heart size. No visible pneumothorax. IMPRESSION: 1. Small pleural effusions and mild atelectasis. No evidence of pneumothorax. 2. COPD. 3. Stable normal heart size. Electronically Signed   By: Monte Fantasia M.D.   On: 08/22/2017 07:37   Dg Chest 2 View  Result Date: 08/21/2017 CLINICAL DATA:  CABG EXAM: CHEST  2 VIEW COMPARISON:  08/20/2017 FINDINGS: Small bilateral layering effusions with bibasilar atelectasis. No pneumothorax. Heart is normal size. Changes of CABG. IMPRESSION:  Small bilateral effusions with bibasilar atelectasis. Electronically Signed   By: Rolm Baptise M.D.   On: 08/21/2017 07:34    Assessment/Plan: S/P Procedure(s) (LRB): CORONARY ARTERY BYPASS GRAFTING (CABG) x 3, USING LEFT INTERNAL MAMMARY ARTERY AND RIGHT GREATER SAPHENOUS VEIN HARVESTED ENDOSCOPICALLY (N/A) TRANSESOPHAGEAL ECHOCARDIOGRAM (TEE) (N/A)   1 overall doing well 2 tachy at times with some PVC's-  will increase beta blocker dose, will remove wires tomorrow 3 labs stable 4 Sugars controlled except one reading of 153- no DM history 5 CXR with small effus/atx- routine pulm toilet 6 some volume overload- add low dose  giuretic 7 poss home 1-2 days  LOS: 4 days    Sarah Pena 08/22/2017 Patient seen and examined, agree with above Probably home tomorrow  Remo Lipps C. Roxan Hockey, MD [facility] [phone]

## 2017-08-22 NOTE — Discharge Instructions (Signed)
Coronary Artery Bypass Grafting, Care After This sheet gives you information about how to care for yourself after your procedure. Your health care provider may also give you more specific instructions. If you have problems or questions, contact your health care provider. What can I expect after the procedure? After the procedure, it is common to have:  Nausea and a lack of appetite.  Constipation.  Weakness and fatigue.  Depression or irritability.  Pain or discomfort in your incision areas.  Follow these instructions at home: Medicines  Take over-the-counter and prescription medicines only as told by your health care provider. Do not stop taking medicines or start any new medicines without approval from your health care provider.  If you were prescribed an antibiotic medicine, take it as told by your health care provider. Do not stop taking the antibiotic even if you start to feel better.  Do not drive or use heavy machinery while taking prescription pain medicine. Incision care  Follow instructions from your health care provider about how to take care of your incisions. Make sure you: ? Wash your hands with soap and water before you change your bandage (dressing). If soap and water are not available, use hand sanitizer. ? Change your dressing as told by your health care provider. ? Leave stitches (sutures), skin glue, or adhesive strips in place. These skin closures may need to stay in place for 2 weeks or longer. If adhesive strip edges start to loosen and curl up, you may trim the loose edges. Do not remove adhesive strips completely unless your health care provider tells you to do that.  Keep incision areas clean, dry, and protected.  Check your incision areas every day for signs of infection. Check for: ? More redness, swelling, or pain. ? More fluid or blood. ? Warmth. ? Pus or a bad smell.  If incisions were made in your legs: ? Avoid crossing your legs. ? Avoid  sitting for long periods of time. Change positions every 30 minutes. ? Raise (elevate) your legs when you are sitting. Bathing  Do not take baths, swim, or use a hot tub until your health care provider approves.  Only take sponge baths. Pat the incisions dry. Do not rub incisions with a washcloth or towel.  Ask your health care provider when you can shower. Eating and drinking  Eat foods that are high in fiber, such as raw fruits and vegetables, whole grains, beans, and nuts. Meats should be lean cut. Avoid canned, processed, and fried foods. This can help prevent constipation and is a recommended part of a heart-healthy diet.  Drink enough fluid to keep your urine clear or pale yellow.  Limit alcohol intake to no more than 1 drink a day for nonpregnant women and 2 drinks a day for men. One drink equals 12 oz of beer, 5 oz of wine, or 1 oz of hard liquor. Activity  Rest and limit your activity as told by your health care provider. You may be instructed to: ? Stop any activity right away if you have chest pain, shortness of breath, irregular heartbeats, or dizziness. Get help right away if you have any of these symptoms. ? Move around frequently for short periods or take short walks as directed by your health care provider. Gradually increase your activities. You may need physical therapy or cardiac rehabilitation to help strengthen your muscles and build your endurance. ? Avoid lifting, pushing, or pulling anything that is heavier than 10 lb (4.5 kg) for at  least 6 weeks or as told by your health care provider.  Do not drive until your health care provider approves.  Ask your health care provider when you may return to work.  Ask your health care provider when you may resume sexual activity. General instructions  Do not use any products that contain nicotine or tobacco, such as cigarettes and e-cigarettes. If you need help quitting, ask your health care provider.  Take 2-3 deep  breaths every few hours during the day, while you recover. This helps expand your lungs and prevent complications like pneumonia after surgery.  If you were given a device called an incentive spirometer, use it several times a day to practice deep breathing. Support your chest with a pillow or your arms when you take deep breaths or cough.  Wear compression stockings as told by your health care provider. These stockings help to prevent blood clots and reduce swelling in your legs.  Weigh yourself every day. This helps identify if your body is holding (retaining) fluid that may make your heart and lungs work harder.  Keep all follow-up visits as told by your health care provider. This is important. Contact a health care provider if:  You have more redness, swelling, or pain around any incision.  You have more fluid or blood coming from any incision.  Any incision feels warm to the touch.  You have pus or a bad smell coming from any incision  You have a fever.  You have swelling in your ankles or legs.  You have pain in your legs.  You gain 2 lb (0.9 kg) or more a day.  You are nauseous or you vomit.  You have diarrhea. Get help right away if:  You have chest pain that spreads to your jaw or arms.  You are short of breath.  You have a fast or irregular heartbeat.  You notice a "clicking" in your breastbone (sternum) when you move.  You have numbness or weakness in your arms or legs.  You feel dizzy or light-headed. Summary  After the procedure, it is common to have pain or discomfort in the incision areas.  Do not take baths, swim, or use a hot tub until your health care provider approves.  Gradually increase your activities. You may need physical therapy or cardiac rehabilitation to help strengthen your muscles and build your endurance.  Weigh yourself every day. This helps identify if your body is holding (retaining) fluid that may make your heart and lungs work  harder. This information is not intended to replace advice given to you by your health care provider. Make sure you discuss any questions you have with your health care provider. Document Released: 01/15/2005 Document Revised: 05/17/2016 Document Reviewed: 05/17/2016 Elsevier Interactive Patient Education  2018 [employer].   Endoscopic Saphenous Vein Harvesting, Care After Refer to this sheet in the next few weeks. These instructions provide you with information about caring for yourself after your procedure. Your health care provider may also give you more specific instructions. Your treatment has been planned according to current medical practices, but problems sometimes occur. Call your health care provider if you have any problems or questions after your procedure. What can I expect after the procedure? After the procedure, it is common to have:  Pain.  Bruising.  Swelling.  Numbness.  Follow these instructions at home: Medicine  Take over-the-counter and prescription medicines only as told by your health care provider.  Do not drive or operate heavy machinery  while taking prescription pain medicine. Incision care   Follow instructions from your health care provider about how to take care of the cut made during surgery (incision). Make sure you: ? Wash your hands with soap and water before you change your bandage (dressing). If soap and water are not available, use hand sanitizer. ? Change your dressing as told by your health care provider. ? Leave stitches (sutures), skin glue, or adhesive strips in place. These skin closures may need to be in place for 2 weeks or longer. If adhesive strip edges start to loosen and curl up, you may trim the loose edges. Do not remove adhesive strips completely unless your health care provider tells you to do that.  Check your incision area every day for signs of infection. Check for: ? More redness, swelling, or pain. ? More fluid or  blood. ? Warmth. ? Pus or a bad smell. General instructions  Raise (elevate) your legs above the level of your heart while you are sitting or lying down.  Do any exercises your health care providers have given you. These may include deep breathing, coughing, and walking exercises.  Do not shower, take baths, swim, or use a hot tub unless told by your health care provider.  Wear your elastic stocking if told by your health care provider.  Keep all follow-up visits as told by your health care provider. This is important. Contact a health care provider if:  Medicine does not help your pain.  Your pain gets worse.  You have new leg bruises or your leg bruises get bigger.  You have a fever.  Your leg feels numb.  You have more redness, swelling, or pain around your incision.  You have more fluid or blood coming from your incision.  Your incision feels warm to the touch.  You have pus or a bad smell coming from your incision. Get help right away if:  Your pain is severe.  You develop pain, tenderness, warmth, redness, or swelling in any part of your leg.  You have chest pain.  You have trouble breathing. This information is not intended to replace advice given to you by your health care provider. Make sure you discuss any questions you have with your health care provider. Document Released: 03/10/2011 Document Revised: 12/04/2015 Document Reviewed: 05/12/2015 Elsevier Interactive Patient Education  2018 [place] Refer to this sheet in the next few weeks. These instructions provide you with information about caring for yourself after your procedure. Your health care provider may also give you more specific instructions. Your treatment has been planned according to current medical practices, but problems sometimes occur. Call your health care provider if you have any problems or questions after your procedure. What can I expect after the  procedure? After your procedure, it is typical to have the following:  Bruising at the radial site that usually fades within 1-2 weeks.  Blood collecting in the tissue (hematoma) that may be painful to the touch. It should usually decrease in size and tenderness within 1-2 weeks.  Follow these instructions at home:  Take medicines only as directed by your health care provider.  You may shower 24-48 hours after the procedure or as directed by your health care provider. Remove the bandage (dressing) and gently wash the site with plain soap and water. Pat the area dry with a clean towel. Do not rub the site, because this may cause bleeding.  Do not take baths, swim, or  use a hot tub until your health care provider approves.  Check your insertion site every day for redness, swelling, or drainage.  Do not apply powder or lotion to the site.  Do not flex or bend the affected arm for 24 hours or as directed by your health care provider.  Do not push or pull heavy objects with the affected arm for 24 hours or as directed by your health care provider.  Do not lift over 10 lb (4.5 kg) for 5 days after your procedure or as directed by your health care provider.  Ask your health care provider when it is okay to: ? Return to work or school. ? Resume usual physical activities or sports. ? Resume sexual activity.  Do not drive home if you are discharged the same day as the procedure. Have someone else drive you.  You may drive 24 hours after the procedure unless otherwise instructed by your health care provider.  Do not operate machinery or power tools for 24 hours after the procedure.  If your procedure was done as an outpatient procedure, which means that you went home the same day as your procedure, a responsible adult should be with you for the first 24 hours after you arrive home.  Keep all follow-up visits as directed by your health care provider. This is important. Contact a health  care provider if:  You have a fever.  You have chills.  You have increased bleeding from the radial site. Hold pressure on the site. CALL 911 Get help right away if:  You have unusual pain at the radial site.  You have redness, warmth, or swelling at the radial site.  You have drainage (other than a small amount of blood on the dressing) from the radial site.  The radial site is bleeding, and the bleeding does not stop after 30 minutes of holding steady pressure on the site.  Your arm or hand becomes pale, cool, tingly, or numb. This information is not intended to replace advice given to you by your health care provider. Make sure you discuss any questions you have with your health care provider. Document Released: 07/31/2010 Document Revised: 12/04/2015 Document Reviewed: 01/14/2014 Elsevier Interactive Patient Education  2018 [employer].

## 2017-08-22 NOTE — Progress Notes (Addendum)
Progress Note  Patient Name: DIJON KOHLMAN Date of Encounter: 08/22/2017  Primary Cardiologist: Gwenlyn Found  Subjective   Postoperative day 4 emergency coronary artery bypass grafting 3 by Dr. Roxan Hockey on the evening of 08/18/17 with a LIMA to the LAD, vein to OM and PDA. She denies chest pain or shortness of breath.  Inpatient Medications    Scheduled Meds: . aspirin EC  81 mg Oral Daily  . atorvastatin  10 mg Oral q1800  . dicyclomine  10 mg Oral BID  . docusate sodium  200 mg Oral Daily  . enoxaparin (LOVENOX) injection  30 mg Subcutaneous Q24H  . insulin aspart  0-24 Units Subcutaneous TID AC & HS  . levalbuterol  0.63 mg Nebulization TID  . lisinopril  5 mg Oral Daily  . magnesium oxide  200 mg Oral Daily  . metoprolol tartrate  12.5 mg Oral BID  . pantoprazole  40 mg Oral QAC breakfast  . sodium chloride flush  3 mL Intravenous Q12H  . traMADol  50 mg Oral Q6H   Continuous Infusions: . sodium chloride     PRN Meds: sodium chloride, acetaminophen, bisacodyl **OR** bisacodyl, levalbuterol, ondansetron **OR** ondansetron (ZOFRAN) IV, oxyCODONE, sodium chloride flush   Vital Signs    Vitals:   08/21/17 2016 08/22/17 0015 08/22/17 0641 08/22/17 0931  BP:  120/75    Pulse:  100 (!) 102   Resp:  (!) 23 20   Temp:  98.7 F (37.1 C)    TempSrc:  Oral    SpO2: 97% 97% 98% 95%  Weight:      Height:        Intake/Output Summary (Last 24 hours) at 08/22/2017 0958 Last data filed at 08/21/2017 2135 Gross per 24 hour  Intake -  Output 100 ml  Net -100 ml   Filed Weights   08/19/17 0500 08/20/17 0500 08/21/17 0500  Weight: 126 lb 15.8 oz (57.6 kg) 123 lb (55.8 kg) 122 lb 5.7 oz (55.5 kg)    Telemetry    Sinus rhythm - Personally Reviewed  ECG    Not performed today - Personally Reviewed  Physical Exam   GEN: No acute distress.   Neck: No JVD Cardiac: RRR, no murmurs, rubs, or gallops.  Respiratory: Clear to auscultation bilaterally. GI: Soft, nontender,  non-distended  MS: No edema; No deformity. Neuro:  Nonfocal  Psych: Normal affect   Labs    Chemistry Recent Labs  Lab 08/20/17 0458 08/21/17 0257 08/22/17 0329  NA 136 135 138  K 3.6 4.0 4.0  CL 104 103 103  CO2 23 22 25   GLUCOSE 108* 94 90  BUN 6 10 14   CREATININE 0.63 0.50 0.56  CALCIUM 7.9* 8.1* 8.4*  GFRNONAA >60 >60 >60  GFRAA >60 >60 >60  ANIONGAP 9 10 10      Hematology Recent Labs  Lab 08/20/17 0458 08/21/17 0257 08/22/17 0329  WBC 13.5* 13.5* 12.8*  RBC 4.28 4.25 4.42  HGB 12.8 13.3 13.4  HCT 39.0 39.4 41.2  MCV 91.1 92.7 93.2  MCH 29.9 31.3 30.3  MCHC 32.8 33.8 32.5  RDW 14.6 14.6 14.4  PLT 127* 124* 169    Cardiac EnzymesNo results for input(s): TROPONINI in the last 168 hours. No results for input(s): TROPIPOC in the last 168 hours.   BNPNo results for input(s): BNP, PROBNP in the last 168 hours.   DDimer No results for input(s): DDIMER in the last 168 hours.   Radiology    Dg  Chest 2 View  Result Date: 08/22/2017 CLINICAL DATA:  Postop check EXAM: CHEST  2 VIEW COMPARISON:  Yesterday FINDINGS: Small pleural effusions and mild atelectasis. Hyperinflation. Status post CABG with normal heart size. No visible pneumothorax. IMPRESSION: 1. Small pleural effusions and mild atelectasis. No evidence of pneumothorax. 2. COPD. 3. Stable normal heart size. Electronically Signed   By: Monte Fantasia M.D.   On: 08/22/2017 07:37   Dg Chest 2 View  Result Date: 08/21/2017 CLINICAL DATA:  CABG EXAM: CHEST  2 VIEW COMPARISON:  08/20/2017 FINDINGS: Small bilateral layering effusions with bibasilar atelectasis. No pneumothorax. Heart is normal size. Changes of CABG. IMPRESSION: Small bilateral effusions with bibasilar atelectasis. Electronically Signed   By: Rolm Baptise M.D.   On: 08/21/2017 07:34    Cardiac Studies   Cardiac catheterization (08/18/17)  IMPRESSION: Ms. Sterne has 99% calcified ostial LAD disease in addition to moderate circumflex and RCA  disease with normal LV function. She has crescendo angina. She will require coronary artery bypass grafting. The sheath was removed and a TR band was placed on the right wrist to achieve patent hemostasis. The patient left the lab in stable condition. She'll be contacted by [facility] on Monday and will be seen next week for evaluation and scheduling her revascularization procedure. She left the lab in stable condition.   Patient Profile     Perline Awe Smithis a 77 y.o.thin-appearing married Caucasian female patient of Dr. Katrine Coho whoI last saw in the office 08/06/14.She has a long history of tobacco abuse having discontinued this 3 yearsago after being treated for pneumonia. She has a strong family history of heart disease with both parents died of heart-related issues. She had a negative stress test in our office 04/16/08. She has been relatively stable until recently whenshe developed new onset substernal chest pain several weeks ago which has been occurring on a daily basis and increasing in frequency and severity. She actually had an episode which prompted her to get go to the emergency room however the pain resolved spontaneously and he turned around and went home. Given the crescendo nature of her symptoms and her risk factors she underwent diagnostic coronary angiography via the right radial approach on Thursday afternoon 08/18/17 revealing high-grade ostial calcified LAD disease as well as circumflex and distal RCA disease with some distal RCA disease. She developed chest pain at night and went urgently for coronary artery bypass grafting.  Assessment & Plan    1: Coronary artery disease-postoperative for emergency coronary artery bypass grafting 2 by Dr. Roxan Hockey. Her postoperative course was unremarkable. She is up walking around on telemetry. She did have normal LV function. I will see her back after discharge in the office.  For questions or updates, please contact [place] Please consult [email or web address] for contact info under Cardiology/STEMI.      Signed, Quay Burow, MD  08/22/2017, 9:58 AM

## 2017-08-22 NOTE — Discharge Summary (Signed)
Physician Discharge Summary  Patient ID: GERA INBODEN MRN: [id number] DOB/AGE: [birth year] 77 y.o.  Admit date: 08/18/2017 Discharge date: 08/24/2017  Admission Diagnoses:  Patient Active Problem List   Diagnosis Date Noted  . CAD (coronary artery disease) 08/18/2017  . GERD (gastroesophageal reflux disease) 08/11/2017  . Solitary pulmonary nodule 03/12/2015  . Chest pain 08/06/2014  . COPD (chronic obstructive pulmonary disease) ([place]) 08/06/2014   Discharge Diagnoses:   Patient Active Problem List   Diagnosis Date Noted  . 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 Coronary artery disease with history of myocardial infarction without history of CABG 08/18/2017  . GERD (gastroesophageal reflux disease) 08/11/2017  . Solitary pulmonary nodule 03/12/2015  . Chest pain 08/06/2014  . COPD (chronic obstructive pulmonary disease) ([place]) 08/06/2014   Discharged Condition: Stable  History of Present Illness:  Mrs. Sarah Pena is a 77 yo female with known history of tobacco abuse, COPD, Melanoma of chest wall, CAD with H/O MI, and family history of CAD.  She developed substernal chest discomfort on exertion.  This had been occurring more frequently and the severity was getting worse.  She was evaluated by Dr. Gwenlyn Found on 08/16/2017 who felt cardiac catheterization would be indicated.  This was performed on 08/18/2017 and showed a tight ostial LAD lesion with moderate RCA and circumflex disease.  She was planning to be discharged, but developed chest pain post procedure with T wave inversions in the anterior leads.  Pain was relieved with SL NTG but her T wave changes persisted.  It was felt emergency coronary bypass would be indicated and TCTS consult was placed.  She was evaluated by Dr. Roxan Hockey who was in agreement and took patient to the operating room for emergency surgery.  Hospital Course:   She underwent CABG x 3 utilizing LIMA to LAD, SVG to OM1, and SVG to PDA.  She also underwent endoscopic harvest of  greater saphenous vein from her right leg.  She tolerated the procedure without difficulty and was taken to the SICU in stable condition.  She was weaned and extubated on POD #1.  Her chest tubes and arterial lines were removed without difficulty.  Her CXR showed mild pulmonary edema.  She was treated with Lasix for this with good response.  She was maintaining NSR.  She was ambulating in the SICU.  She was medically stable for transfer to the telemetry unit on 08/21/2017.  She continued to progress.  She developed mild tachycardia with PVCs.  Her beta blocker was increased as tolerated.  Her pacing wires were removed without difficulty on 08/23/2017.  Her incisions are healing without evidence of infection.  She continues to ambulate without difficulty.  Chest tube sutures will be removed today. Her Lopressor was increased to 37.5 mg bid for better HR control. Her HR did increase into the 140's. EKG done did showed ST, PAcs. As discussed with Dr. Roxan Hockey, started oral Cardizem 60 mg bid. We avoided Amiodarone secondary to her history of COPD. She is SR in the 80-90's this am. Lopressor was decreased to 25 mg bid. As discussed with Dr. Roxan Hockey, she is medically stable for discharge home today.        Significant Diagnostic Studies: angiography:    Dist LM to Ost LAD lesion is 99% stenosed.  Ost Cx lesion is 70% stenosed.  Prox LAD lesion is 95% stenosed.  Mid LAD lesion is 95% stenosed.  Prox RCA lesion is 50% stenosed.  Mid RCA lesion is 80% stenosed.  The left  ventricular systolic function is normal.  LV end diastolic pressure is normal.  The left ventricular ejection fraction is 55-65% by visual estimate  Treatments: surgery:   PREOPERATIVE DIAGNOSIS:  Three-vessel coronary artery disease with unstable chest pain.  POSTOPERATIVE DIAGNOSIS:  Three-vessel coronary artery disease with unstable chest pain.  PROCEDURE:  Median sternotomy, extracorporeal circulation, coronary  artery bypass grafting x3 (left internal mammary artery to left anterior descending, saphenous vein graft to obtuse marginal 1, saphenous vein graft to posterior descending), endoscopic vein harvest, right leg.  Disposition: 01-Home or Self Care   Discharge Medications:  Discharge Instructions    Amb Referral to Cardiac Rehabilitation   Complete by:  As directed    Diagnosis:  CABG   CABG X ___:  3     Allergies as of 08/24/2017      Reactions   Statins Other (See Comments)   Patient refuses> practices holistic lifestyle      Medication List    STOP taking these medications   naproxen sodium 220 MG tablet Commonly known as:  ALEVE     TAKE these medications   acetaminophen 325 MG tablet Commonly known as:  TYLENOL Take 2 tablets (650 mg total) by mouth every 6 (six) hours as needed for mild pain.   albuterol 108 (90 Base) MCG/ACT inhaler Commonly known as:  PROAIR HFA Inhale 2 puffs into the lungs every 6 (six) hours as needed for wheezing or shortness of breath.   aspirin 81 MG tablet Take 81 mg by mouth daily.   atorvastatin 10 MG tablet Commonly known as:  LIPITOR Take 1 tablet (10 mg total) by mouth daily at 6 PM.   calcium carbonate 500 MG chewable tablet Commonly known as:  TUMS - dosed in mg elemental calcium Chew 1 tablet by mouth daily as needed for indigestion or heartburn.   dicyclomine 10 MG capsule Commonly known as:  BENTYL Take 1 tab twice daily as needed for abdominal pain and urgency. What changed:    how much to take  how to take this  when to take this  additional instructions   diltiazem 60 MG tablet Commonly known as:  CARDIZEM Take 1 tablet (60 mg total) by mouth every 12 (twelve) hours.   furosemide 40 MG tablet Commonly known as:  LASIX Take 1 tablet (40 mg total) by mouth daily. For 3 days then stop.   lisinopril 5 MG tablet Commonly known as:  PRINIVIL,ZESTRIL Take 1 tablet (5 mg total) by mouth daily.   Magnesium 250 MG  Tabs Take 250 mg by mouth daily.   metoprolol tartrate 25 MG tablet Commonly known as:  LOPRESSOR Take 1 tablet (25 mg total) by mouth 2 (two) times daily.   NAUZENE [phone] MG Chew Generic drug:  Dextrose-Fructose-Sod Citrate Chew 2 tablets by mouth as needed (nausea).   OIL OF OREGANO PO Take 1 capsule by mouth daily.   OVER THE COUNTER MEDICATION Place 6 drops under the tongue 2 (two) times daily. CBD oil   pantoprazole 20 MG tablet Commonly known as:  PROTONIX Take 1 tablet (20 mg total) by mouth daily.   potassium chloride SA 20 MEQ tablet Commonly known as:  K-DUR,KLOR-CON Take 1 tablet (20 mEq total) by mouth daily. For 3 days then stop.   STIOLTO RESPIMAT 2.5-2.5 MCG/ACT Aers Generic drug:  Tiotropium Bromide-Olodaterol INHALE 2 PUFFS IN THE LUNGS ONCE DAILY   traMADol 50 MG tablet Commonly known as:  ULTRAM Take 1 tablet (50 mg total)  by mouth every 6 (six) hours.   TURMERIC PO Take 1 capsule by mouth daily.   Vitamin D-3 1000 units Caps Take 1,000 Units by mouth daily.     The patient has been discharged on:   1.Beta Blocker:  Yes [ x  ]                              No   [   ]                              If No, reason:  2.Ace Inhibitor/ARB: Yes [  x ]                                     No  [    ]                                     If No, reason:  3.Statin:   Yes [ x  ]                  No  [   ]                  If No, reason:  4.Ecasa:  Yes  [ x  ]                  No   [   ]                  If No, reason:    Follow-up Information    Melrose Nakayama, MD Follow up on 09/20/2017.   Specialty:  Cardiothoracic Surgery Why:  Appointment is at 12:30, please get CXR at 12:00 at [facility] located on first floor of our office building Contact information: [place] [address] [phone]        [name], MD. Go on 09/06/2017.   Specialties:  Cardiology, Radiology Why:  Appointment  time is at 11:30 Contact information: [address] [place] [address] [phone]           Signed: Arnoldo Lenis 08/24/2017, 7:58 AM

## 2017-08-22 NOTE — Progress Notes (Signed)
CARDIAC REHAB PHASE I   PRE:  Rate/Rhythm: 103 ST  BP:  Supine:   Sitting: 130/76  Standing:    SaO2: 99% 2L  MODE:  Ambulation: 470 ft   POST:  Rate/Rhythm: 115 ST  BP:  Supine:   Sitting: 139/79  Standing:    SaO2: 94-95%RA 0836-0910 Pt walked 470 ft on RA with rolling walker with steady gait. Tolerated well on RA. Monitored sats whole walk. Left off oxygen and encouraged IS. To recliner with call bell. Daughter in room.   Graylon Good, RN BSN  08/22/2017 9:06 AM

## 2017-08-23 DIAGNOSIS — I252 Old myocardial infarction: Secondary | ICD-10-CM

## 2017-08-23 DIAGNOSIS — I2510 Atherosclerotic heart disease of native coronary artery without angina pectoris: Secondary | ICD-10-CM

## 2017-08-23 LAB — GLUCOSE, CAPILLARY
GLUCOSE-CAPILLARY: 109 mg/dL — AB (ref 65–99)
GLUCOSE-CAPILLARY: 105 mg/dL — AB (ref 65–99)
Glucose-Capillary: 102 mg/dL — ABNORMAL HIGH (ref 65–99)

## 2017-08-23 MED ORDER — AMIODARONE HCL 200 MG PO TABS: 400.0000 mg | ORAL_TABLET | Freq: Two times a day (BID) | ORAL | Status: DC

## 2017-08-23 MED ORDER — POTASSIUM CHLORIDE CRYS ER 20 MEQ PO TBCR: 20.0000 meq | EXTENDED_RELEASE_TABLET | Freq: Every day | ORAL | 0 refills | Status: DC

## 2017-08-23 MED ORDER — AMIODARONE IV BOLUS ONLY 150 MG/100ML
150.0000 mg | Freq: Once | INTRAVENOUS | Status: DC
  Filled 2017-08-23: qty 100

## 2017-08-23 MED ORDER — DILTIAZEM HCL 60 MG PO TABS
60.0000 mg | ORAL_TABLET | Freq: Two times a day (BID) | ORAL | Status: DC
  Administered 2017-08-23 – 2017-08-24 (×3): 60 mg via ORAL
  Filled 2017-08-23 (×3): qty 1

## 2017-08-23 MED ORDER — LISINOPRIL 5 MG PO TABS: 5.0000 mg | ORAL_TABLET | Freq: Every day | ORAL | 1 refills | Status: DC

## 2017-08-23 MED ORDER — LEVALBUTEROL HCL 0.63 MG/3ML IN NEBU
0.6300 mg | INHALATION_SOLUTION | Freq: Four times a day (QID) | RESPIRATORY_TRACT | Status: DC
  Administered 2017-08-23 – 2017-08-24 (×4): 0.63 mg via RESPIRATORY_TRACT
  Filled 2017-08-23 (×4): qty 3

## 2017-08-23 MED ORDER — FUROSEMIDE 40 MG PO TABS: 40.0000 mg | ORAL_TABLET | Freq: Every day | ORAL | 0 refills | Status: DC

## 2017-08-23 MED ORDER — POTASSIUM CHLORIDE CRYS ER 20 MEQ PO TBCR
20.0000 meq | EXTENDED_RELEASE_TABLET | Freq: Every day | ORAL | Status: DC
  Administered 2017-08-23 – 2017-08-24 (×2): 20 meq via ORAL
  Filled 2017-08-23 (×2): qty 1

## 2017-08-23 MED ORDER — METOPROLOL TARTRATE 25 MG PO TABS
37.5000 mg | ORAL_TABLET | Freq: Two times a day (BID) | ORAL | Status: DC
  Administered 2017-08-23 (×2): 37.5 mg via ORAL
  Filled 2017-08-23 (×2): qty 1

## 2017-08-23 MED ORDER — TRAMADOL HCL 50 MG PO TABS: 50.0000 mg | ORAL_TABLET | Freq: Four times a day (QID) | ORAL | 0 refills | Status: DC

## 2017-08-23 MED ORDER — FUROSEMIDE 40 MG PO TABS
40.0000 mg | ORAL_TABLET | Freq: Every day | ORAL | Status: DC
  Administered 2017-08-23 – 2017-08-24 (×2): 40 mg via ORAL
  Filled 2017-08-23 (×2): qty 1

## 2017-08-23 MED ORDER — ATORVASTATIN CALCIUM 10 MG PO TABS: 10.0000 mg | ORAL_TABLET | Freq: Every day | ORAL | 1 refills | Status: DC

## 2017-08-23 MED ORDER — ACETAMINOPHEN 325 MG PO TABS: 650.0000 mg | ORAL_TABLET | Freq: Four times a day (QID) | ORAL | Status: DC | PRN

## 2017-08-23 MED ORDER — METOPROLOL TARTRATE 25 MG PO TABS: 37.5000 mg | ORAL_TABLET | Freq: Two times a day (BID) | ORAL | 0 refills | Status: DC

## 2017-08-23 MED FILL — Mannitol IV Soln 20%: INTRAVENOUS | Qty: 500 | Status: AC

## 2017-08-23 MED FILL — Magnesium Sulfate Inj 50%: INTRAMUSCULAR | Qty: 10 | Status: AC

## 2017-08-23 MED FILL — Potassium Chloride Inj 2 mEq/ML: INTRAVENOUS | Qty: 40 | Status: AC

## 2017-08-23 MED FILL — Electrolyte-R (PH 7.4) Solution: INTRAVENOUS | Qty: 4000 | Status: AC

## 2017-08-23 MED FILL — Lidocaine HCl IV Inj 20 MG/ML: INTRAVENOUS | Qty: 5 | Status: AC

## 2017-08-23 MED FILL — Albumin, Human Inj 5%: INTRAVENOUS | Qty: 250 | Status: AC

## 2017-08-23 MED FILL — Heparin Sodium (Porcine) Inj 1000 Unit/ML: INTRAMUSCULAR | Qty: 10 | Status: AC

## 2017-08-23 MED FILL — Sodium Bicarbonate IV Soln 8.4%: INTRAVENOUS | Qty: 50 | Status: AC

## 2017-08-23 MED FILL — Heparin Sodium (Porcine) Inj 1000 Unit/ML: INTRAMUSCULAR | Qty: 30 | Status: AC

## 2017-08-23 MED FILL — Sodium Chloride IV Soln 0.9%: INTRAVENOUS | Qty: 2000 | Status: AC

## 2017-08-23 NOTE — Progress Notes (Addendum)
Pacing wires pulled per protocol and as ordered. All ends intact. On one end of one of the wires a small blue stitch remains and small piece of tissue on the end also. Lars Pinks [facility] made aware.   Slight oozing from left Ventrical wire site. Pressure held and oozing subsided at this time. BP 131/86 heart rate 104 on monitor patient reminded to lie supine approximately one hour. Will monitor patient. Jesicca Dipierro, Bettina Gavia RN

## 2017-08-23 NOTE — Progress Notes (Addendum)
      [address]       [place] [address]             [phone]        5 Days Post-Op Procedure(s) (LRB): CORONARY ARTERY BYPASS GRAFTING (CABG) x 3, USING LEFT INTERNAL MAMMARY ARTERY AND RIGHT GREATER SAPHENOUS VEIN HARVESTED ENDOSCOPICALLY (N/A) TRANSESOPHAGEAL ECHOCARDIOGRAM (TEE) (N/A)  Subjective: Patient without complaints this am.  Objective: Vital signs in last 24 hours: Temp:  [98.1 F (36.7 C)-98.6 F (37 C)] 98.1 F (36.7 C) (02/12 0456) Pulse Rate:  [87-110] 104 (02/12 0456) Cardiac Rhythm: Sinus tachycardia (02/11 1900) Resp:  [18-20] 18 (02/12 0456) BP: (125-139)/(72-97) 125/78 (02/12 0456) SpO2:  [90 %-100 %] 96 % (02/12 0639) Weight:  [118 lb 1.6 oz (53.6 kg)] 118 lb 1.6 oz (53.6 kg) (02/12 0456)  Pre op weight 52.2 kg Current Weight  08/23/17 118 lb 1.6 oz (53.6 kg)      Intake/Output from previous day: 02/11 0701 - 02/12 0700 In: 720 [P.O.:720] Out: 550 [Urine:550]   Physical Exam:  Cardiovascular: Slightly tachycardic Pulmonary: Slightly diminished at bases Abdomen: Soft, non tender, bowel sounds present. Extremities: Trace bilateral lower extremity edema. Wounds: Clean and dry.  No erythema or signs of infection.  Lab Results: CBC: Recent Labs    08/21/17 0257 08/22/17 0329  WBC 13.5* 12.8*  HGB 13.3 13.4  HCT 39.4 41.2  PLT 124* 169   BMET:  Recent Labs    08/21/17 0257 08/22/17 0329  NA 135 138  K 4.0 4.0  CL 103 103  CO2 22 25  GLUCOSE 94 90  BUN 10 14  CREATININE 0.50 0.56  CALCIUM 8.1* 8.4*    PT/INR:  Lab Results  Component Value Date   INR 1.51 08/19/2017   INR 1.0 08/16/2017   INR 0.94 10/29/2014   ABG:  INR: Will add last result for INR, ABG once components are confirmed Will add last 4 CBG results once components are confirmed  Assessment/Plan:  1. CV - Has had ST, PVCs. Slightly tachycardic this am. On Lopressor 25 mg bid and Lisinopril 5 mg daily. Will increase Lopressor to  37.5 mg bid for better HR control. 2.  Pulmonary - On room air. Encourage incentive spirometer. 3. Volume Overload - On Lasix 20 mg daily. Will increase to 40 mg daily 4. Remove EPW 5. CBGs 106/116/102. No history of diabetes. No HGA1C drawn. Will stop accu checks and SS PRN 6. Will discuss disposition with Dr. Kalyn Dimattia-patient's husband who will take care of her is at home sick.  Donielle M ZimmermanPA-C 08/23/2017,7:18 AM  Patient seen and examined, agree with above Dc pacing wires Ready for dc when arrangements complete  Remo Lipps C. Roxan Hockey, MD [facility] [phone]

## 2017-08-23 NOTE — Progress Notes (Signed)
Patien's HR increased into the 140's while I was on the floor. EKG done showed ST with PACs? I discussed with Dr. Roxan Hockey as thought we should avoid Amiodarone with COPD history. He agreed and recommended starting Cardizem 60 mg bid.

## 2017-08-23 NOTE — Care Management Important Message (Signed)
Important Message  Patient Details  Name: Sarah Pena MRN: [id number] Date of Birth: Dec 02, 1940   Medicare Important Message Given:  Yes    Orbie Pyo 08/23/2017, 11:16 AM

## 2017-08-23 NOTE — Progress Notes (Signed)
CARDIAC REHAB PHASE I   PRE:  Rate/Rhythm: Sinus tach 102  BP:  Supine: 100/72    SaO2: 96%  MODE:  Ambulation:did not ambulate   POST:  Rate/Rhythem:   BP:   [id number] Educated patient and family on home use of incentive spirometer, home exercise instructions, sternal precautions and heart healthy diet with the patient and her family. Reviewed moving right along booklet with the patient and her family. Patient was noted to have questionable runs of atrial fibrillation with heart rate noted at time in the 130's. Patient asymptomatic.Patient's RN [name] notified. Patient assisted to the bathroom after bedrest over without complaints. Patient did not ambulate in the hallway. Patient assisted back to bedside to eat lunch. Call bell within reach. Family present. Skarlett says she is interested in phase 2 cardiac rehab in [place] upon discharge from the hospital.   Harrell Gave RN BSN.

## 2017-08-23 NOTE — Progress Notes (Signed)
Patient CT sutures removed as ordered. And steri strips applied. Will monitor patient. Shajuana Mclucas, Bettina Gavia RN

## 2017-08-23 NOTE — Progress Notes (Signed)
Patient ambulated in hallway with husband and walker. Back in room willmonitior patient. Maliha Outten, Bettina Gavia RN

## 2017-08-23 NOTE — Progress Notes (Addendum)
Patient with increased heart rate on monitor upt to 140s-150. EKG obtained Patient resting eating lunch at this time Lars Pinks [facility] made aware and on floor.  Will monitor patient. Terese Heier, Bettina Gavia RN

## 2017-08-23 NOTE — Care Management Note (Signed)
Case Management Note Marvetta Gibbons RN, BSN Unit 4E-Case Manager-- [place] coverage [phone]  Patient Details  Name: Sarah Pena MRN: [id number] Date of Birth: Oct 05, 1940  Subjective/Objective:  Pt admitted with chest pain- s/p emergency CABGx3 overnight    Action/Plan: PTA pt lived at home with spouse- CM to follow for transition of care needs.   Expected Discharge Date:                 Expected Discharge Plan:  Home/Self Care  In-House Referral:  NA  Discharge planning Services  CM Consult  Post Acute Care Choice:  NA Choice offered to:  NA  DME Arranged:    DME Agency:     HH Arranged:    HH Agency:     Status of Service:  Completed, signed off  If discussed at [place] of Stay Meetings, dates discussed:  2/12  Discharge Disposition: home/self care   Additional Comments:  08/23/17- 1100- Calianne Larue RN,CM- pt for d/c home today- husband  home sick (but not with the flu)- per bedside RN pt states he is feeling better today and she feels ok going home with spouse. No CM needs noted for transition home.    Dawayne Patricia, RN 08/23/2017, 11:12 AM

## 2017-08-24 MED ORDER — DILTIAZEM HCL 60 MG PO TABS: 60.0000 mg | ORAL_TABLET | Freq: Two times a day (BID) | ORAL | 1 refills | Status: DC

## 2017-08-24 MED ORDER — METOPROLOL TARTRATE 25 MG PO TABS: 25.0000 mg | ORAL_TABLET | Freq: Two times a day (BID) | ORAL | 1 refills | Status: DC

## 2017-08-24 MED ORDER — METOPROLOL TARTRATE 25 MG PO TABS
25.0000 mg | ORAL_TABLET | Freq: Two times a day (BID) | ORAL | Status: DC
  Administered 2017-08-24: 25 mg via ORAL
  Filled 2017-08-24: qty 1

## 2017-08-24 NOTE — Progress Notes (Addendum)
      [address]       [place] [address]             [phone]        6 Days Post-Op Procedure(s) (LRB): CORONARY ARTERY BYPASS GRAFTING (CABG) x 3, USING LEFT INTERNAL MAMMARY ARTERY AND RIGHT GREATER SAPHENOUS VEIN HARVESTED ENDOSCOPICALLY (N/A) TRANSESOPHAGEAL ECHOCARDIOGRAM (TEE) (N/A)  Subjective: Patient without complaints this am.  Objective: Vital signs in last 24 hours: Temp:  [98 F (36.7 C)-98.9 F (37.2 C)] 98.2 F (36.8 C) (02/13 0640) Pulse Rate:  [50-106] 85 (02/13 0640) Cardiac Rhythm: Normal sinus rhythm;Bundle branch block (02/12 1900) Resp:  [20-26] 24 (02/13 0640) BP: (101-139)/(57-86) 132/71 (02/13 0640) SpO2:  [91 %-98 %] 92 % (02/13 0640) Weight:  [116 lb 3.2 oz (52.7 kg)] 116 lb 3.2 oz (52.7 kg) (02/13 0640)  Pre op weight 52.2 kg Current Weight  08/24/17 116 lb 3.2 oz (52.7 kg)      Intake/Output from previous day: 02/12 0701 - 02/13 0700 In: -  Out: 450 [Urine:450]   Physical Exam:  Cardiovascular: RRR Pulmonary: Slightly diminished at bases Abdomen: Soft, non tender, bowel sounds present. Extremities: Trace bilateral lower extremity edema. Wounds: Clean and dry.  No erythema or signs of infection.  Lab Results: CBC: Recent Labs    08/22/17 0329  WBC 12.8*  HGB 13.4  HCT 41.2  PLT 169   BMET:  Recent Labs    08/22/17 0329  NA 138  K 4.0  CL 103  CO2 25  GLUCOSE 90  BUN 14  CREATININE 0.56  CALCIUM 8.4*    PT/INR:  Lab Results  Component Value Date   INR 1.51 08/19/2017   INR 1.0 08/16/2017   INR 0.94 10/29/2014   ABG:  INR: Will add last result for INR, ABG once components are confirmed Will add last 4 CBG results once components are confirmed  Assessment/Plan:  1. CV - Has had tachycardia, PVCs.  SR in the 80's this am. On Lopressor 37.5 mg bid, Cardizem 60 mg bid, and Lisinopril 5 mg daily. Will decrease Lopressor to 25 mg bid. 2.  Pulmonary - On room air. Encourage incentive  spirometer. 3. Volume Overload - On Lasix 40 mg daily.  4. Discharge today   Jalessa Peyser M ZimmermanPA-C 08/24/2017,7:15 AM

## 2017-08-29 NOTE — Addendum Note (Signed)
Addendum  created 08/29/17 0753 by Oleta Mouse, MD   Intraprocedure Blocks edited, Sign clinical note

## 2017-09-05 ENCOUNTER — Telehealth: Payer: Self-pay | Admitting: Adult Health

## 2017-09-05 NOTE — Telephone Encounter (Signed)
Called her, she is doing well .

## 2017-09-05 NOTE — Telephone Encounter (Signed)
Will route this to TP. Please advise. Thanks.

## 2017-09-06 ENCOUNTER — Encounter: Payer: Self-pay | Admitting: Cardiovascular Disease

## 2017-09-06 ENCOUNTER — Ambulatory Visit: Payer: Medicare Other | Admitting: Cardiovascular Disease

## 2017-09-06 VITALS — BP 112/70 | HR 83 | Ht 60.0 in | Wt 109.4 lb

## 2017-09-06 DIAGNOSIS — I2510 Atherosclerotic heart disease of native coronary artery without angina pectoris: Secondary | ICD-10-CM | POA: Diagnosis not present

## 2017-09-06 DIAGNOSIS — I252 Old myocardial infarction: Secondary | ICD-10-CM | POA: Diagnosis not present

## 2017-09-06 MED ORDER — METOPROLOL TARTRATE 25 MG PO TABS: 25.0000 mg | ORAL_TABLET | Freq: Two times a day (BID) | ORAL | 1 refills | Status: DC

## 2017-09-06 NOTE — Patient Instructions (Addendum)
Medication Instructions: Your physician recommends that you continue on your current medications as directed. Please refer to the Current Medication list given to you today.  START Lopressor (Metoprolol) 25 mg twice daily.   Labwork: Your physician recommends that you return for a FASTING lipid profile and hepatic function panel at your earliest convenience.   Follow-Up: Your physician recommends that you schedule a follow-up appointment in: 1 month with PharmD in [facility]. Your physician has requested that you regularly monitor and record your blood pressure readings at home. Please use the same machine at the same time of day to check your readings and record them to bring to your follow-up visit.  Your physician wants you to follow-up in: 6 months with Dr. Gwenlyn Found. You will receive a reminder letter in the mail two months in advance. If you don't receive a letter, please call our office to schedule the follow-up appointment.  If you need a refill on your cardiac medications before your next appointment, please call your pharmacy.

## 2017-09-06 NOTE — Assessment & Plan Note (Signed)
History of CAD status post cardiac catheterization by myself 08/18/17 revealing three-vessel disease with preserved LV function. She had acceleration of her symptoms the evening of anticipated discharge will take emergently to the operating room by Dr. Roxan Hockey which time she had bypass grafting 3 with a LIMA to LAD, vein to obtuse marginal branch and posterior descending arteries. Her postop course was unremarkable. She was discharged home on a beta blocker and a calcium channel blocker as well as a ACE inhibitor which she decided not to take. She is recuperating slowly. I have suggested that she participate in cardiac rehabilitation.

## 2017-09-06 NOTE — Progress Notes (Signed)
09/06/2017 Sarah Pena   January 29, 1941  [id number]  Primary Physician Deland Pretty, MD Primary Cardiologist: [name] MD Lupe Carney, [place]  HPI:  Sarah Pena is a 77 y.o.   thin-appearing married Caucasian female patient of Dr. Thayer Jew Pharr's who I last saw in the office 08/16/17 . She has a long history of tobacco abuse having discontinued this 3 years ago after being treated for pneumonia. She has a strong family history of heart disease with both parents died of heart-related issues. She had a negative stress test in our office 04/16/08. She has been relatively stable until recently when she developed new onset substernal chest pain several weeks ago which has been occurring on a daily basis and increasing in frequency and severity. Because of the axillary nature of her symptoms I elected to proceed with diagnostic coronary angiography on 08/18/17 revealing three-vessel disease with preserved LV function. She had accelerated symptoms post catheterization went to emergency bypass surgery that night by Dr. Roxan Hockey who placed the LIMA to LAD, vein graft to obtuse marginal branch and the PDA. Her postop course was uneventful..     Current Meds  Medication Sig  . acetaminophen (TYLENOL) 325 MG tablet Take 2 tablets (650 mg total) by mouth every 6 (six) hours as needed for mild pain.  Marland Kitchen albuterol (PROAIR HFA) 108 (90 Base) MCG/ACT inhaler Inhale 2 puffs into the lungs every 6 (six) hours as needed for wheezing or shortness of breath.  Marland Kitchen aspirin 81 MG tablet Take 81 mg by mouth daily.  . calcium carbonate (TUMS - DOSED IN MG ELEMENTAL CALCIUM) 500 MG chewable tablet Chew 1 tablet by mouth daily as needed for indigestion or heartburn.  . Cholecalciferol (VITAMIN D-3) 1000 UNITS CAPS Take 1,000 Units by mouth daily.   Marland Kitchen Dextrose-Fructose-Sod Citrate (NAUZENE) [phone] MG CHEW Chew 2 tablets by mouth as needed (nausea).   . dicyclomine (BENTYL) 10 MG capsule Take 1 tab twice  daily as needed for abdominal pain and urgency. (Patient taking differently: Take 10 mg by mouth 2 (two) times daily. )  . Magnesium 250 MG TABS Take 250 mg by mouth daily.  . OIL OF OREGANO PO Take 1 capsule by mouth daily.   Marland Kitchen OVER THE COUNTER MEDICATION Place 6 drops under the tongue 2 (two) times daily. CBD oil  . pantoprazole (PROTONIX) 20 MG tablet Take 1 tablet (20 mg total) by mouth daily.  Marland Kitchen STIOLTO RESPIMAT 2.5-2.5 MCG/ACT AERS INHALE 2 PUFFS IN THE LUNGS ONCE DAILY  . TURMERIC PO Take 1 capsule by mouth daily.      Allergies  Allergen Reactions  . Statins Other (See Comments)    Patient refuses> practices holistic lifestyle    Social History   Socioeconomic History  . Marital status: Married    Spouse name: Not on file  . Number of children: Not on file  . Years of education: Not on file  . Highest education level: Not on file  Social Needs  . Financial resource strain: Not on file  . Food insecurity - worry: Not on file  . Food insecurity - inability: Not on file  . Transportation needs - medical: Not on file  . Transportation needs - non-medical: Not on file  Occupational History  . Not on file  Tobacco Use  . Smoking status: Former Smoker    Packs/day: 1.00    Years: 50.00    Pack years: 50.00    Types: Cigarettes  Last attempt to quit: 05/22/2014    Years since quitting: 3.2  . Smokeless tobacco: Never Used  . Tobacco comment: quit 05/2014  Substance and Sexual Activity  . Alcohol use: Yes    Alcohol/week: 0.0 oz    Comment: rarely  . Drug use: No  . Sexual activity: Not on file  Other Topics Concern  . Not on file  Social History Narrative  . Not on file     Review of Systems: General: negative for chills, fever, night sweats or weight changes.  Cardiovascular: negative for chest pain, dyspnea on exertion, edema, orthopnea, palpitations, paroxysmal nocturnal dyspnea or shortness of breath Dermatological: negative for rash Respiratory:  negative for cough or wheezing Urologic: negative for hematuria Abdominal: negative for nausea, vomiting, diarrhea, bright red blood per rectum, melena, or hematemesis Neurologic: negative for visual changes, syncope, or dizziness All other systems reviewed and are otherwise negative except as noted above.    Blood pressure 112/70, pulse 83, height 5' (1.524 m), weight 109 lb 6.4 oz (49.6 kg).  General appearance: alert and no distress Neck: no adenopathy, no carotid bruit, no JVD, supple, symmetrical, trachea midline and thyroid not enlarged, symmetric, no tenderness/mass/nodules Lungs: clear to auscultation bilaterally Heart: regular rate and rhythm, S1, S2 normal, no murmur, click, rub or gallop Extremities: extremities normal, atraumatic, no cyanosis or edema Pulses: 2+ and symmetric Skin: Skin color, texture, turgor normal. No rashes or lesions Neurologic: Alert and oriented X 3, normal strength and tone. Normal symmetric reflexes. Normal coordination and gait  EKG sinus rhythm at 83 with incomplete right bundle-branch block, low limb voltage and nonspecific ST-T wave changes. I personally reviewed this EKG  ASSESSMENT AND PLAN:   Coronary artery disease with history of myocardial infarction without history of CABG History of CAD status post cardiac catheterization by myself 08/18/17 revealing three-vessel disease with preserved LV function. She had acceleration of her symptoms the evening of anticipated discharge will take emergently to the operating room by Dr. Roxan Hockey which time she had bypass grafting 3 with a LIMA to LAD, vein to obtuse marginal branch and posterior descending arteries. Her postop course was unremarkable. She was discharged home on a beta blocker and a calcium channel blocker as well as a ACE inhibitor which she decided not to take. She is recuperating slowly. I have suggested that she participate in cardiac rehabilitation.      [name] MD  FACP,FACC,FAHA, [facility] 09/06/2017 11:46 AM

## 2017-09-09 ENCOUNTER — Ambulatory Visit: Payer: Medicare Other | Admitting: Cardiovascular Disease

## 2017-09-14 LAB — HEPATIC FUNCTION PANEL
ALT: 13 IU/L (ref 0–32)
AST: 19 IU/L (ref 0–40)
Albumin: 4.5 g/dL (ref 3.5–4.8)
Alkaline Phosphatase: 131 IU/L — ABNORMAL HIGH (ref 39–117)
Bilirubin Total: 0.4 mg/dL (ref 0.0–1.2)
Bilirubin, Direct: 0.13 mg/dL (ref 0.00–0.40)
TOTAL PROTEIN: 7.4 g/dL (ref 6.0–8.5)

## 2017-09-14 LAB — LIPID PANEL
CHOLESTEROL TOTAL: 246 mg/dL — AB (ref 100–199)
Chol/HDL Ratio: 4.3 ratio (ref 0.0–4.4)
HDL: 57 mg/dL (ref >39)
LDL Calculated: 161 mg/dL — ABNORMAL HIGH (ref 0–99)
TRIGLYCERIDES: 141 mg/dL (ref 0–149)
VLDL Cholesterol Cal: 28 mg/dL (ref 5–40)

## 2017-09-19 ENCOUNTER — Other Ambulatory Visit: Payer: Self-pay | Admitting: Thoracic Surgery (Cardiothoracic Vascular Surgery)

## 2017-09-19 DIAGNOSIS — Z951 Presence of aortocoronary bypass graft: Secondary | ICD-10-CM

## 2017-09-20 ENCOUNTER — Ambulatory Visit
Admission: RE | Admit: 2017-09-20 | Discharge: 2017-09-20 | Disposition: A | Discharge status: Home / Self Care | Payer: Medicare Other | Source: Ambulatory Visit | Attending: Thoracic Surgery (Cardiothoracic Vascular Surgery) | Admitting: Thoracic Surgery (Cardiothoracic Vascular Surgery)

## 2017-09-20 ENCOUNTER — Ambulatory Visit (INDEPENDENT_AMBULATORY_CARE_PROVIDER_SITE_OTHER): Payer: Self-pay | Admitting: Thoracic Surgery (Cardiothoracic Vascular Surgery)

## 2017-09-20 ENCOUNTER — Encounter: Payer: Self-pay | Admitting: Thoracic Surgery (Cardiothoracic Vascular Surgery)

## 2017-09-20 VITALS — BP 130/80 | HR 85 | Resp 20 | Ht 60.0 in | Wt 106.0 lb

## 2017-09-20 DIAGNOSIS — Z951 Presence of aortocoronary bypass graft: Secondary | ICD-10-CM

## 2017-09-20 NOTE — Progress Notes (Signed)
[address]       [place] [address]             [phone]     HPI: Sarah Pena returns for a scheduled postoperative follow-up visit  Sarah Pena is a 77 year old woman with a history of tobacco abuse, COPD, malignant melanoma, hiatal hernia, and myocardial infarction.  She also has a strong family history of coronary disease.  She had progressive substernal chest pain with exertion.  She underwent cardiac catheterization on 08/18/2017.  She had a tight ostial LAD lesion and moderate blockages in the right and circumflex.  In the holding area she developed chest pain with T wave inversions.  She was advised to undergo emergency bypass grafting.  She had emergency coronary bypass grafting x3 on 08/18/2017.  Her postoperative course was remarkable for tachycardia.  It was mostly sinus tachycardia but she did have some brief runs of atrial arrhythmias.  She was started on diltiazem.  We did not use amiodarone due to her severe COPD.  She was discharged on 08/24/2017.  Since discharge she has been doing well.  She has some sensitivity to touch around her chest but is not having pain outside of that.  She has not had any recurrent angina.  She does not have any swelling in her legs.  Past Medical History:  Diagnosis Date  . Asthma 05/2015  . Cancer (HCC)    Melanoma of chest wall  . COPD (chronic obstructive pulmonary disease) ([place])   . Family history of heart disease   . History of hiatal hernia    'small"  . History of kidney stones   . Myocardial infarction ([place])    "mild"  years ago  . Shortness of breath dyspnea    just recently- sob walking up stairs- "I think its pollen.    Current Outpatient Medications  Medication Sig Dispense Refill  . acetaminophen (TYLENOL) 325 MG tablet Take 2 tablets (650 mg total) by mouth every 6 (six) hours as needed for mild pain.    Marland Kitchen albuterol (PROAIR HFA) 108 (90 Base) MCG/ACT inhaler Inhale 2 puffs into the lungs every 6 (six)  hours as needed for wheezing or shortness of breath. 1 Inhaler 3  . aspirin 81 MG tablet Take 81 mg by mouth daily.    . calcium carbonate (TUMS - DOSED IN MG ELEMENTAL CALCIUM) 500 MG chewable tablet Chew 1 tablet by mouth daily as needed for indigestion or heartburn.    . Cholecalciferol (VITAMIN D-3) 1000 UNITS CAPS Take 1,000 Units by mouth daily.     Marland Kitchen Dextrose-Fructose-Sod Citrate (NAUZENE) [phone] MG CHEW Chew 2 tablets by mouth as needed (nausea).     . dicyclomine (BENTYL) 10 MG capsule Take 1 tab twice daily as needed for abdominal pain and urgency. (Patient taking differently: Take 10 mg by mouth 2 (two) times daily. ) 60 capsule 4  . Magnesium 250 MG TABS Take 250 mg by mouth daily.    . OIL OF OREGANO PO Take 1 capsule by mouth daily.     Marland Kitchen OVER THE COUNTER MEDICATION Place 6 drops under the tongue 2 (two) times daily. CBD oil    . pantoprazole (PROTONIX) 20 MG tablet Take 1 tablet (20 mg total) by mouth daily. 90 tablet 3  . STIOLTO RESPIMAT 2.5-2.5 MCG/ACT AERS INHALE 2 PUFFS IN THE LUNGS ONCE DAILY 4 g 3  . TURMERIC PO Take 1 capsule by mouth daily.  No current facility-administered medications for this visit.     Physical Exam BP 130/80   Pulse 85   Resp 20   Ht 5' (1.524 m)   Wt 106 lb (48.1 kg)   SpO2 97% Comment: RA  BMI 20.81 kg/m  77 year old woman in no acute distress Alert and oriented x3 with no focal deficits Lungs diminished breath sounds bilaterally, equal Cardiac regular rate and rhythm normal S1 and S2 Sternal incision healing well, sternum stable Leg incision healing well, no peripheral edema  Diagnostic Tests: CHEST - 2 VIEW  COMPARISON:  08/22/2017.  FINDINGS: Prior CABG. Heart size normal. No pulmonary venous congestion. Interim improvement of bibasilar atelectasis. Small residual bilateral pleural effusions. No pneumothorax.  IMPRESSION: 1. Interim improvement of bibasilar atelectasis. Small residual bilateral pleural  effusions.  2.  Prior CABG.  Heart size normal.  No pulmonary venous congestion.   Electronically Signed   By: Marcello Moores  Register   On: 09/20/2017 12:20 I personally reviewed the chest x-ray images and concur with the findings noted above  Impression: Mrs. Sarah Pena is a 77 year old woman with multiple cardiac risk factors who presented with accelerating angina.  She was found to have three-vessel disease at catheterization.  After her catheterization she had unstable chest pain with T wave inversions.  She was taken that evening for emergent coronary bypass grafting x3.  She did well postoperatively and went home on day #6.  She currently is doing well.  She has not had any recurrent anginal pain.  She does still have some hypersensitivity to touch around the incision that should improve with time.  She is only taking Tylenol for pain.  She is not using any narcotics.  At this point she may begin driving on a limited basis.  Appropriate precautions were discussed.  She should not lift anything over 10pounds before 1 April.  After that her activities are unrestricted.  She will continue to follow-up with Dr. Quay Burow.  Tobacco abuse-quit 3 years ago.  Importance of continued abstinence was discussed.  Plan: Follow-up with Dr. Quay Burow.  I will be happy to see her back again anytime the future if I can be of any further assistance with care  Melrose Nakayama, MD [facility] [phone]

## 2017-09-26 ENCOUNTER — Telehealth: Payer: Self-pay | Admitting: Cardiovascular Disease

## 2017-09-26 NOTE — Telephone Encounter (Signed)
Routed to MD and primary to make aware 

## 2017-09-26 NOTE — Telephone Encounter (Signed)
Sherri calling with [facility], states that patient is not interested in cardiac outpatient rehab at this time.

## 2017-10-03 ENCOUNTER — Telehealth: Payer: Self-pay

## 2017-10-03 ENCOUNTER — Telehealth: Payer: Self-pay | Admitting: Cardiovascular Disease

## 2017-10-03 NOTE — Telephone Encounter (Signed)
Did not need this encounter °

## 2017-10-03 NOTE — Telephone Encounter (Signed)
Sarah Pena is s/p CABG x3 08/18/2017, she called because Dr. Roxan Hockey at her last follow-up appointment at the office to have a mole looked at by her Dermatologist.  She stated that she has since been to the Dermatologist and stated that she is going to have the mole removed and asked if it was ok to have the procedure done.  Dr. Roxan Hockey stated on his last note that she was to return as needed to the office and I advised her that it was ok that she have it done from a surgical stand-point, however she should give her Cardiologist a call to make sure it was ok.  She acknowledged receipt.

## 2017-10-03 NOTE — Telephone Encounter (Signed)
   Primary Cardiologist: Quay Burow, MD  Chart reviewed as part of pre-operative protocol coverage. The patient is schedule to have removal of squamous cell cancer, left side of chest under local on 10/06/17.    Recent CABG x 3 on 08/20/17. She was doing fine during office visit with you on 09/06/17.  Can you comment on clearance? She is still sore at surgical site but no chest pain. She is able to get 5.07 mets of activity.    [place], [place] 10/03/2017, 4:17 PM

## 2017-10-03 NOTE — Telephone Encounter (Signed)
New message        [facility] Pre-operative Risk Assessment    Request for surgical clearance:  1. What type of surgery is being performed? Removal of squamous cell cancer, left side of chest   2. When is this surgery scheduled? 10/06/2017  3. What type of clearance is required (medical clearance vs. Pharmacy clearance to hold med vs. Both)? Medical  4. Are there any medications that need to be held prior to surgery and how long?N/A  5. Practice name and name of physician performing surgery? Dr. Mare Ferrari  6. What is your office phone and fax number? Fax [phone], phone Nanetta Batty [phone]  7. Anesthesia type (None, local, MAC, general) ? local   Laurier Nancy 10/03/2017, 4:00 PM  _________________________________________________________________   (provider comments below)

## 2017-10-04 ENCOUNTER — Ambulatory Visit (INDEPENDENT_AMBULATORY_CARE_PROVIDER_SITE_OTHER): Payer: Medicare Other | Admitting: Pharmacist Clinician (PhC)/ Clinical Pharmacy Specialist

## 2017-10-04 DIAGNOSIS — I252 Old myocardial infarction: Secondary | ICD-10-CM | POA: Diagnosis not present

## 2017-10-04 DIAGNOSIS — I2510 Atherosclerotic heart disease of native coronary artery without angina pectoris: Secondary | ICD-10-CM

## 2017-10-04 NOTE — Telephone Encounter (Signed)
Low risk procedure. Cleared at low risk

## 2017-10-04 NOTE — Patient Instructions (Signed)
If you have any questions or concerns, please call us at [phone]  [name] and [name])  Your blood pressure today is 132/78  Check your blood pressure at home no more than twice daily and keep record of the readings.  Take your BP meds as follows:  We would encourage you to start the metoprolol twice daily.  You can cut the tablets in half and start with 1/2 tablet twice daily.  This should not significantly alter your blood pressure or heart rate.  Exercise as you're able, try to walk approximately 30 minutes per day.  Keep salt intake to a minimum, especially watch canned and prepared boxed foods.  Eat more fresh fruits and vegetables and fewer canned items.  Avoid eating in fast food restaurants.    HOW TO TAKE YOUR BLOOD PRESSURE: . Rest 5 minutes before taking your blood pressure. .  Don't smoke or drink caffeinated beverages for at least 30 minutes before. . Take your blood pressure before (not after) you eat. . Sit comfortably with your back supported and both feet on the floor (don't cross your legs). . Elevate your arm to heart level on a table or a desk. . Use the proper sized cuff. It should fit smoothly and snugly around your bare upper arm. There should be enough room to slip a fingertip under the cuff. The bottom edge of the cuff should be 1 inch above the crease of the elbow. . Ideally, take 3 measurements at one sitting and record the average.

## 2017-10-04 NOTE — Telephone Encounter (Signed)
Clearance efaxed to requesting provider.

## 2017-10-04 NOTE — Progress Notes (Signed)
10/05/2017 Sarah Pena 1941-01-24 [id number]   HPI:  Sarah Pena is a 77 y.o. female patient of Dr Gwenlyn Found with a PMH below who presents today for hypertension clinic evaluation.   Her medical history is significant for hyperlipidemia, COPD and prior tobacco abuse.  She saw Dr. Gwenlyn Found in 2016 after an episode of chest pain, however a stress test was negative and patient was not seen by Dr. Gwenlyn Found again until February of this year, again with chest pain.  He arranged for a heart cath which found 3 regions of 95% stenosis.  She was to be discharged with plans for seeing surgeon about need for CABG.  Prior to discharge patient again reported chest pain, relieved by nitroglycerin.  She was taken to surgery that evening for CABG x 3.  On discharge patient was given prescriptions for metoprolol, lisinopril, diltiazem, furosemide, potassium and atorvastatin.  She came to a follow up appointment with Dr. Gwenlyn Found two weeks later and stated that she had not started any of those medications.  She was also encouraged to participate in cardiac rehab, which she is currently declining.    Blood Pressure Goal:  130/80  Current Medications:  Metoprolol 25 mg bid  Family Hx:  Both parents died from heart disease  Social Hx:  Quit smoking 3-4 years ago after bout of pneumonia  Diet:  Husband does most of the cooking; no added salt; feels that heart healthy is bland/tasteless  Exercise:  States she walks regularly, when pressed for distance or time, she states she paces in driveway while great-grandchildren play.  Home BP readings:  Checks about once weekly, states always "normal"  Intolerances:   NKDA although patient refuses most medications  Labs:  08/22/17: Na 138, K 4.0, Glu 90, BUN 14, SCr 0.56;  CrCl based on weight of 49.6 kg = 68  Wt Readings from Last 3 Encounters:  09/20/17 106 lb (48.1 kg)  09/06/17 109 lb 6.4 oz (49.6 kg)  08/24/17 116 lb 3.2 oz (52.7 kg)   BP Readings from Last 3  Encounters:  10/04/17 132/78  09/20/17 130/80  09/06/17 112/70   Pulse Readings from Last 3 Encounters:  10/04/17 72  09/20/17 85  09/06/17 83    Current Outpatient Medications  Medication Sig Dispense Refill  . acetaminophen (TYLENOL) 325 MG tablet Take 2 tablets (650 mg total) by mouth every 6 (six) hours as needed for mild pain.    Marland Kitchen albuterol (PROAIR HFA) 108 (90 Base) MCG/ACT inhaler Inhale 2 puffs into the lungs every 6 (six) hours as needed for wheezing or shortness of breath. 1 Inhaler 3  . aspirin 81 MG tablet Take 81 mg by mouth daily.    . calcium carbonate (TUMS - DOSED IN MG ELEMENTAL CALCIUM) 500 MG chewable tablet Chew 1 tablet by mouth daily as needed for indigestion or heartburn.    . Cholecalciferol (VITAMIN D-3) 1000 UNITS CAPS Take 1,000 Units by mouth daily.     Marland Kitchen Dextrose-Fructose-Sod Citrate (NAUZENE) [phone] MG CHEW Chew 2 tablets by mouth as needed (nausea).     . dicyclomine (BENTYL) 10 MG capsule Take 1 tab twice daily as needed for abdominal pain and urgency. (Patient taking differently: Take 10 mg by mouth 2 (two) times daily. ) 60 capsule 4  . Magnesium 250 MG TABS Take 250 mg by mouth daily.    . OIL OF OREGANO PO Take 1 capsule by mouth daily.     Marland Kitchen OVER THE COUNTER  MEDICATION Place 6 drops under the tongue 2 (two) times daily. CBD oil    . pantoprazole (PROTONIX) 20 MG tablet Take 1 tablet (20 mg total) by mouth daily. 90 tablet 3  . STIOLTO RESPIMAT 2.5-2.5 MCG/ACT AERS INHALE 2 PUFFS IN THE LUNGS ONCE DAILY 4 g 3  . TURMERIC PO Take 1 capsule by mouth daily.      No current facility-administered medications for this visit.     Allergies  Allergen Reactions  . Statins Other (See Comments)    Patient refuses> practices holistic lifestyle    Past Medical History:  Diagnosis Date  . Asthma 05/2015  . Cancer (HCC)    Melanoma of chest wall  . COPD (chronic obstructive pulmonary disease) ([place])   . Family history of heart disease   . History  of hiatal hernia    'small"  . History of kidney stones   . Myocardial infarction ([place])    "mild"  years ago  . Shortness of breath dyspnea    just recently- sob walking up stairs- "I think its pollen.    Blood pressure 132/78, pulse 72.  Coronary artery disease with history of myocardial infarction without history of CABG Patient with recent CABG x 3, currently not on any cardioprotective medications.  Patient prefers holistic healing.  We reviewed importance of certain medications as well as getting plenty of exercise.  Patient will think about adding in metoprolol at 12.5 mg bid, but I suspect she won't.  Had lengthy discussion about definition of a heart healthy diet, patient thinks it's too bland.  Gave suggestions for seasonings that will help flavor her meats, encouraged plenty of vegetables, preferably not canned.  Patient will follow up with Dr. Gwenlyn Found as instructed.     Tommy Medal PharmD CPP [facility] [address] [place], [place] [address] [phone]

## 2017-10-05 ENCOUNTER — Encounter: Payer: Self-pay | Admitting: Pharmacist Clinician (PhC)/ Clinical Pharmacy Specialist

## 2017-10-05 NOTE — Assessment & Plan Note (Signed)
Patient with recent CABG x 3, currently not on any cardioprotective medications.  Patient prefers holistic healing.  We reviewed importance of certain medications as well as getting plenty of exercise.  Patient will think about adding in metoprolol at 12.5 mg bid, but I suspect she won't.  Had lengthy discussion about definition of a heart healthy diet, patient thinks it's too bland.  Gave suggestions for seasonings that will help flavor her meats, encouraged plenty of vegetables, preferably not canned.  Patient will follow up with Dr. Gwenlyn Found as instructed.

## 2017-11-14 ENCOUNTER — Other Ambulatory Visit: Payer: Self-pay | Admitting: Pulmonary Disease

## 2017-11-18 ENCOUNTER — Ambulatory Visit: Payer: Medicare Other | Admitting: Pulmonary Disease

## 2017-11-18 ENCOUNTER — Encounter: Payer: Self-pay | Admitting: Pulmonary Disease

## 2017-11-18 VITALS — BP 112/60 | HR 44 | Ht 60.0 in | Wt 104.0 lb

## 2017-11-18 DIAGNOSIS — Z951 Presence of aortocoronary bypass graft: Secondary | ICD-10-CM

## 2017-11-18 DIAGNOSIS — R001 Bradycardia, unspecified: Secondary | ICD-10-CM

## 2017-11-18 MED ORDER — PREDNISONE 10 MG PO TABS: ORAL_TABLET | ORAL | 0 refills | Status: DC

## 2017-11-18 MED ORDER — FLUTICASONE-UMECLIDIN-VILANT 100-62.5-25 MCG/INH IN AEPB: 1.0000 | INHALATION_SPRAY | Freq: Every day | RESPIRATORY_TRACT | 0 refills | Status: DC

## 2017-11-18 NOTE — Assessment & Plan Note (Signed)
EKG appears normal, no bradycardia

## 2017-11-18 NOTE — Patient Instructions (Signed)
Prednisone 10 mg tabs  Take 2 tabs daily with food x 5ds, then 1 tab daily with food x 5ds then STOP  Take Zyrtec 10 mg daily. Use albuterol 2 puffs half an hour before bedtime and as needed during the daytime  Sample of Trelegy once daily, rinse mouth after use. Start taking instead of STIOLTO -and call for prescription if this works

## 2017-11-18 NOTE — Assessment & Plan Note (Signed)
We will treat as flare due to seasonal allergies  Prednisone 10 mg tabs  Take 2 tabs daily with food x 5ds, then 1 tab daily with food x 5ds then STOP  Take Zyrtec 10 mg daily. Use albuterol 2 puffs half an hour before bedtime and as needed during the daytime  Sample of Trelegy once daily, rinse mouth after use. Start taking instead of STIOLTO -and call for prescription if this works

## 2017-11-18 NOTE — Progress Notes (Signed)
   Subjective:    Patient ID: Sarah Pena, female    DOB: February 04, 1941, 77 y.o.   MRN: [id number]  HPI  77 year old former smoker For follow-up ofmoderate COPD  She quit smoking in 2016 , husband quit smoking December 2018 She underwent CABG 08/2017 and has recovered well.  She reports increasing wheezing over the last few weeks, this seems to wake her husband up at night and she has to use her albuterol. She wonders if this is due to pollen she has been taking over-the-counter medicine for this. Denies, productive cough, mostly dry, some nasal congestion no itchy eyes  Heart rate is noted to be low 44 today, blood pressure fine, no dizziness. Reviewed prior visits and heart rate is always been 70s to 80s  EKG shows normal sinus rhythm at 84 and incomplete RBBB with LAHB (old)  Significant tests/ events  02/2015 Spirometry - severe airway obstruction - FEV1 0.9-52%, FVC 1.4-59%, ratio of 61 Spirometry 07/2016-improved FEV1 at 62%  CT angiogram 03/2008 -showed 8 mm lesion in the right apex CT chest 03/2015 >>Less than 3 mm stable bilateral upper lobe ground-glass nodules  CT chest 05/2016 - unchanged nodules   Review of Systems neg for any significant sore throat, dysphagia, itching, sneezing, nasal congestion or excess/ purulent secretions, fever, chills, sweats, unintended wt loss, pleuritic or exertional cp, hempoptysis, orthopnea pnd or change in chronic leg swelling. Also denies presyncope, palpitations, heartburn, abdominal pain, nausea, vomiting, diarrhea or change in bowel or urinary habits, dysuria,hematuria, rash, arthralgias, visual complaints, headache, numbness weakness or ataxia.     Objective:   Physical Exam   Gen. Pleasant, thin, in no distress ENT - no thrush, no post nasal drip Neck: No JVD, no thyromegaly, no carotid bruits Lungs: no use of accessory muscles, no dullness to percussion, decreased  without rales or rhonchi  Cardiovascular: Rhythm regular,  heart sounds  normal, no murmurs or gallops, no peripheral edema Musculoskeletal: No deformities, no cyanosis or clubbing         Assessment & Plan:

## 2017-12-12 ENCOUNTER — Ambulatory Visit: Payer: Medicare Other | Admitting: Pulmonary Disease

## 2017-12-29 DIAGNOSIS — M65312 Trigger thumb, left thumb: Secondary | ICD-10-CM | POA: Insufficient documentation

## 2017-12-29 HISTORY — DX: Trigger thumb, left thumb: M65.312

## 2017-12-30 ENCOUNTER — Ambulatory Visit: Payer: Medicare Other | Admitting: Pulmonary Disease

## 2017-12-30 ENCOUNTER — Encounter: Payer: Self-pay | Admitting: Pulmonary Disease

## 2017-12-30 DIAGNOSIS — Z951 Presence of aortocoronary bypass graft: Secondary | ICD-10-CM

## 2017-12-30 DIAGNOSIS — J449 Chronic obstructive pulmonary disease, unspecified: Secondary | ICD-10-CM

## 2017-12-30 MED ORDER — FLUTICASONE-UMECLIDIN-VILANT 100-62.5-25 MCG/INH IN AEPB: 1.0000 | INHALATION_SPRAY | Freq: Every day | RESPIRATORY_TRACT | 0 refills | Status: DC

## 2017-12-30 MED ORDER — FLUTICASONE-UMECLIDIN-VILANT 100-62.5-25 MCG/INH IN AEPB: 1.0000 | INHALATION_SPRAY | Freq: Every day | RESPIRATORY_TRACT | 3 refills | Status: DC

## 2017-12-30 NOTE — Patient Instructions (Signed)
Referral to cardiac rehab at [facility]. Prescription for trilogy for 3 months with 3 refills

## 2017-12-30 NOTE — Progress Notes (Signed)
   Subjective:    Patient ID: Sarah Pena, female    DOB: [birth year], 77 y.o.   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    She had a flareup last visit attributed to pollen and has recovered well with prednisone.  She changed from Stiolto to Trelegy and this is worked well for her and she would like a prescription.  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 Pleasant, thin, in no distress ENT - no thrush, no post nasal drip Neck: No JVD, no thyromegaly, no carotid bruits Lungs: no use of accessory muscles, no dullness to percussion, clear without rales or rhonchi  Cardiovascular: Rhythm regular, heart sounds  normal, no murmurs or gallops, no peripheral edema Musculoskeletal: No deformities, no cyanosis or clubbing        Assessment & Plan:

## 2017-12-30 NOTE — Assessment & Plan Note (Addendum)
Referral to cardiac rehab at [facility].  She would also qualify for pulmonary rehab

## 2017-12-30 NOTE — Assessment & Plan Note (Signed)
Prescription for trelegy for 3 months with 3 refills

## 2017-12-30 NOTE — Addendum Note (Signed)
Addended by: Valerie Salts on: 12/30/2017 03:43 PM   Modules accepted: Orders

## 2018-04-12 DIAGNOSIS — M25519 Pain in unspecified shoulder: Secondary | ICD-10-CM

## 2018-04-12 HISTORY — DX: Pain in unspecified shoulder: M25.519

## 2018-06-27 DIAGNOSIS — M25611 Stiffness of right shoulder, not elsewhere classified: Secondary | ICD-10-CM | POA: Insufficient documentation

## 2018-06-27 HISTORY — DX: Stiffness of right shoulder, not elsewhere classified: M25.611

## 2018-07-03 ENCOUNTER — Encounter: Payer: Self-pay | Admitting: Adult Health

## 2018-07-03 ENCOUNTER — Ambulatory Visit: Payer: Medicare Other | Admitting: Adult Health

## 2018-07-03 VITALS — BP 130/62 | HR 68 | Ht 60.0 in | Wt 117.0 lb

## 2018-07-03 DIAGNOSIS — Z87891 Personal history of nicotine dependence: Secondary | ICD-10-CM | POA: Diagnosis not present

## 2018-07-03 DIAGNOSIS — J449 Chronic obstructive pulmonary disease, unspecified: Secondary | ICD-10-CM | POA: Diagnosis not present

## 2018-07-03 MED ORDER — ALBUTEROL SULFATE HFA 108 (90 BASE) MCG/ACT IN AERS: 2.0000 | INHALATION_SPRAY | Freq: Four times a day (QID) | RESPIRATORY_TRACT | 1 refills | Status: DC | PRN

## 2018-07-03 MED ORDER — FLUTICASONE-UMECLIDIN-VILANT 100-62.5-25 MCG/INH IN AEPB: 1.0000 | INHALATION_SPRAY | Freq: Every day | RESPIRATORY_TRACT | 3 refills | Status: DC

## 2018-07-03 NOTE — Patient Instructions (Signed)
Continue on TRELEGY 1 puff daily, rinse after use.  Refer to [facility] for LDCT Chest screening .  Follow up with Dr. Elsworth Soho or Parrett NP in 6 months and As needed   Please contact office for sooner follow up if symptoms do not improve or worsen or seek emergency care

## 2018-07-03 NOTE — Addendum Note (Signed)
Addended by: Parke Poisson E on: 07/03/2018 04:52 PM   Modules accepted: Orders

## 2018-07-03 NOTE — Assessment & Plan Note (Signed)
Well-controlled on current regimen Refer for low-dose CT screening program  Plan  Patient Instructions  Continue on TRELEGY 1 puff daily, rinse after use.  Refer to [facility] for LDCT Chest screening .  Follow up with Dr. Elsworth Soho or Boni Maclellan NP in 6 months and As needed   Please contact office for sooner follow up if symptoms do not improve or worsen or seek emergency care

## 2018-07-03 NOTE — Progress Notes (Signed)
@Patient  ID: Sarah Pena, female    DOB: 06-Mar-1941, 77 y.o.   MRN: [id number]  Chief Complaint  Patient presents with  . 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  Patient has moderate COPD.  She says overall her breathing's been doing about the same.  She gets winded with heavy activity.  She remains on Trelegy daily.  She denies any flare of cough or wheezing. She says she is trying to be active.  She tries to walk at a local mall. We discussed the low-dose CT screening program.  She is interested in it  She says she has had quite a bit of stress over the last 2 months.  She had a fall while [place].  She hurt her right shoulder.  Has been seeing an orthopedist.  She is currently going for an MRI in the next week or 2.     Allergies  Allergen Reactions  . Statins Other (See Comments)    Patient refuses> practices holistic lifestyle    Immunization History  Administered Date(s) Administered  . Influenza, High Dose Seasonal PF 04/11/2017, 04/25/2018  . Influenza,inj,Quad PF,6+ Mos 03/13/2015, 03/26/2016  . Pneumococcal Conjugate-13 05/12/2015    Past Medical History:  Diagnosis Date  . Asthma 05/2015  . Cancer (HCC)    Melanoma of chest wall  . COPD (chronic obstructive pulmonary disease) ([place])   . Family history of heart disease   . History of hiatal hernia    'small"  . History of kidney stones   . Myocardial infarction ([place])    "mild"   years ago  . Shortness of breath dyspnea    just recently- sob walking up stairs- "I think its pollen.    Tobacco History: Social History   Tobacco Use  Smoking Status Former Smoker  . Packs/day: 1.00  . Years: 50.00  . Pack years: 50.00  . Types: Cigarettes  . Last attempt to quit: 05/22/2014  . Years since quitting: 4.1  Smokeless Tobacco Never Used  Tobacco Comment   quit 05/2014   Counseling given: Not Answered Comment: quit 05/2014   Outpatient Medications Prior to Visit  Medication Sig Dispense Refill  . aspirin 81 MG tablet Take 81 mg by mouth daily.    . calcium carbonate (OS-CAL - DOSED IN MG OF ELEMENTAL CALCIUM) 1250 (500 Ca) MG tablet Take 1 tablet by mouth daily with breakfast.    . calcium carbonate (TUMS - DOSED IN MG ELEMENTAL CALCIUM) 500 MG chewable tablet Chew 1 tablet by mouth daily as needed for indigestion or heartburn.    . Cholecalciferol (VITAMIN D-3) 1000 UNITS CAPS Take 1,000 Units by mouth daily.     Marland Kitchen Dextrose-Fructose-Sod Citrate (NAUZENE) [phone] MG CHEW Chew 2 tablets by mouth as needed (nausea).     . Magnesium 250 MG TABS Take 250 mg by mouth daily.    . OIL OF OREGANO PO Take 1 capsule by mouth daily.     Marland Kitchen  OVER THE COUNTER MEDICATION Place 6 drops under the tongue 2 (two) times daily. CBD oil    . TURMERIC PO Take 1 capsule by mouth daily.     Marland Kitchen albuterol (PROAIR HFA) 108 (90 Base) MCG/ACT inhaler Inhale 2 puffs into the lungs every 6 (six) hours as needed for wheezing or shortness of breath. 1 Inhaler 3  . Fluticasone-Umeclidin-Vilant (TRELEGY ELLIPTA) 100-62.5-25 MCG/INH AEPB Inhale 1 puff into the lungs daily. 180 each 3  . Fluticasone-Umeclidin-Vilant (TRELEGY ELLIPTA) 100-62.5-25 MCG/INH AEPB Inhale 1 puff into the lungs daily. 1 each 0   No facility-administered medications prior to visit.      Review of Systems  Constitutional:   No  weight loss, night sweats,  Fevers, chills, +fatigue, or  lassitude.  HEENT:   No  headaches,  Difficulty swallowing,  Tooth/dental problems, or  Sore throat,                No sneezing, itching, ear ache, nasal congestion, post nasal drip,   CV:  No chest pain,  Orthopnea, PND, swelling in lower extremities, anasarca, dizziness, palpitations, syncope.   GI  No heartburn, indigestion, abdominal pain, nausea, vomiting, diarrhea, change in bowel habits, loss of appetite, bloody stools.   Resp:  No excess mucus, no productive cough,  No non-productive cough,  No coughing up of blood.  No change in color of mucus.  No wheezing.  No chest wall deformity  Skin: no rash or lesions.  GU: no dysuria, change in color of urine, no urgency or frequency.  No flank pain, no hematuria   MS: Shoulder pain+    Physical Exam  BP 130/62 (BP Location: Left Arm, Cuff Size: Normal)   Pulse 68   Ht 5' (1.524 m)   Wt 117 lb (53.1 kg)   SpO2 98%   BMI 22.85 kg/m   GEN: A/Ox3; pleasant , NAD, elderly   HEENT:  [place]/AT,  EACs-clear, TMs-wnl, NOSE-clear, THROAT-clear, no lesions, no postnasal drip or exudate noted.   NECK:  Supple w/ fair ROM; no JVD; normal carotid impulses w/o bruits; no thyromegaly or nodules palpated; no lymphadenopathy.    RESP decreased breath sounds in the bases  no accessory muscle use, no dullness to percussion  CARD:  RRR, no m/r/g, no peripheral edema, pulses intact, no cyanosis or clubbing.  GI:   Soft & nt; nml bowel sounds; no organomegaly or masses detected.   Musco: Warm bil, no deformities or joint swelling noted.   Neuro: alert, no focal deficits noted.    Skin: Warm, no lesions or rashes    Lab Results:  CBC  BMET  BNP No results found for: BNP  ProBNP No results found for: PROBNP  Imaging: No results found.    No flowsheet data found.  No results found for: NITRICOXIDE      Assessment & Plan:   COPD (chronic obstructive pulmonary disease) Well-controlled on current regimen Refer for low-dose CT screening  program  Plan  Patient Instructions  Continue on TRELEGY 1 puff daily, rinse after use.  Refer to [facility] for LDCT Chest screening .  Follow up with Dr. Elsworth Soho or Jyair Kiraly NP in 6 months and As needed   Please contact office for sooner follow up if symptoms do not improve or worsen or seek emergency care              Rexene Edison, NP 07/03/2018

## 2018-07-13 ENCOUNTER — Telehealth: Payer: Self-pay | Admitting: Acute Care

## 2018-07-13 DIAGNOSIS — Z87891 Personal history of nicotine dependence: Secondary | ICD-10-CM

## 2018-07-13 DIAGNOSIS — Z122 Encounter for screening for malignant neoplasm of respiratory organs: Secondary | ICD-10-CM

## 2018-07-14 NOTE — Telephone Encounter (Signed)
Spoke with pt and scheduled SDMV 07/26/18 11:00 CT ordered Nothing further needed

## 2018-07-14 NOTE — Telephone Encounter (Signed)
Per TP 07/03/18 OV- Patient was referred to [place] for LDCT screening   Will route message to [facility], [place] to follow up.

## 2018-07-21 DIAGNOSIS — S42253A Displaced fracture of greater tuberosity of unspecified humerus, initial encounter for closed fracture: Secondary | ICD-10-CM

## 2018-07-21 HISTORY — DX: Displaced fracture of greater tuberosity of unspecified humerus, initial encounter for closed fracture: S42.253A

## 2018-07-25 NOTE — Progress Notes (Signed)
Shared Decision Making Visit Lung Cancer Screening Program [phone])   Eligibility:  Age 78 y.o.  Pack Years Smoking History Calculation 54 pack year smoking history (# packs/per year x # years smoked)  Recent History of coughing up blood  no  Unexplained weight loss? no ( >Than 15 pounds within the last 6 months )  Prior History Lung / other cancer no (Diagnosis within the last 5 years already requiring surveillance chest CT Scans).  Smoking Status Former Smoker  Former Smokers: Years since quit: 4 years  Quit Date: 05/22/2014  Visit Components:  Discussion included one or more decision making aids. yes  Discussion included risk/benefits of screening. yes  Discussion included potential follow up diagnostic testing for abnormal scans. yes  Discussion included meaning and risk of over diagnosis. yes  Discussion included meaning and risk of False Positives. yes  Discussion included meaning of total radiation exposure. yes  Counseling Included:  Importance of adherence to annual lung cancer LDCT screening. yes  Impact of comorbidities on ability to participate in the program. yes  Ability and willingness to under diagnostic treatment. yes  Smoking Cessation Counseling:  Current Smokers:   Discussed importance of smoking cessation. NA  Information about tobacco cessation classes and interventions provided to patient. yes  Patient provided with "ticket" for LDCT Scan. yes  Symptomatic Patient. no  Counseling  Diagnosis Code: Tobacco Use Z72.0  Asymptomatic Patient yes  Counseling (Intermediate counseling: > three minutes counseling) F8101  Former Smokers:   Discussed the importance of maintaining cigarette abstinence. yes  Diagnosis Code: Personal History of Nicotine Dependence. B51.025  Information about tobacco cessation classes and interventions provided to patient. Yes  Patient provided with "ticket" for LDCT Scan. yes  Written Order for Lung  Cancer Screening with LDCT placed in Epic. Yes (CT Chest Lung Cancer Screening Low Dose W/O CM) [id number] Z12.2-Screening of respiratory organs Z87.891-Personal history of nicotine dependence  I spent 25 minutes of face to face time with Ms. Baldwin discussing the risks and benefits of lung cancer screening. We viewed a power point together that explained in detail the above noted topics. We took the time to pause the power point at intervals to allow for questions to be asked and answered to ensure understanding. We discussed that she had taken the single most powerful action possible to decrease her risk of developing lung cancer when she quit smoking. I counseled her to remain smoke free, and to contact me if she ever had the desire to smoke again so that I can provide resources and tools to help support the effort to remain smoke free. We discussed the time and location of the scan, and that either  Doroteo Glassman RN or I will call with the results within  24-48 hours of receiving them. She has my card and contact information in the event she needs to speak with me, in addition to a copy of the power point we reviewed as a resource. She verbalized understanding of all of the above and had no further questions upon leaving the office.     I explained to the patient that there has been a high incidence of coronary artery disease noted on these exams. I explained that this is a non-gated exam therefore degree or severity cannot be determined. This patient is on not statin therapy. I have asked the patient to follow-up with their PCP regarding any incidental finding of coronary artery disease and management with diet or medication as they feel is clinically  indicated. The patient verbalized understanding of the above and had no further questions.     Magdalen Spatz, NP 07/26/2018 1:34 PM

## 2018-07-26 ENCOUNTER — Ambulatory Visit (INDEPENDENT_AMBULATORY_CARE_PROVIDER_SITE_OTHER)
Admission: RE | Admit: 2018-07-26 | Discharge: 2018-07-26 | Disposition: A | Discharge status: Home / Self Care | Payer: Medicare Other | Source: Ambulatory Visit | Attending: Acute Care | Admitting: Acute Care

## 2018-07-26 ENCOUNTER — Ambulatory Visit (INDEPENDENT_AMBULATORY_CARE_PROVIDER_SITE_OTHER): Payer: Medicare Other | Admitting: Acute Care

## 2018-07-26 ENCOUNTER — Encounter: Payer: Self-pay | Admitting: Acute Care

## 2018-07-26 VITALS — BP 136/76 | HR 71 | Ht 60.0 in | Wt 117.0 lb

## 2018-07-26 DIAGNOSIS — Z87891 Personal history of nicotine dependence: Secondary | ICD-10-CM

## 2018-07-26 DIAGNOSIS — Z122 Encounter for screening for malignant neoplasm of respiratory organs: Secondary | ICD-10-CM

## 2018-07-28 ENCOUNTER — Other Ambulatory Visit: Payer: Self-pay | Admitting: Acute Care

## 2018-07-28 DIAGNOSIS — Z87891 Personal history of nicotine dependence: Secondary | ICD-10-CM

## 2018-07-28 DIAGNOSIS — Z122 Encounter for screening for malignant neoplasm of respiratory organs: Secondary | ICD-10-CM

## 2018-08-30 ENCOUNTER — Telehealth: Payer: Self-pay | Admitting: Physician Assistant

## 2018-08-30 MED ORDER — DICYCLOMINE HCL 10 MG PO CAPS
10.0000 mg | ORAL_CAPSULE | Freq: Two times a day (BID) | ORAL | 1 refills | Status: DC
Start: 2018-08-30 — End: 2018-10-30

## 2018-08-30 NOTE — Telephone Encounter (Signed)
Patient states she ate seafood several days ago and starting have RUQ abd pain and intermittent nausea. Pt states the symptoms are very similar to her symptoms last year when she saw Nicoletta Ba, PA in the office. Patient states a medication worked really well for these symptoms but she has ran out of the medication. Spoke with Amy and she states patient can have a temporary supply of one month and one refill but needs to make an appointment for further refills or if pain does not resolve. Informed patient that I will send Bentyl to her pharmacy and if the pain does not resolve or if she needs further refills to call our office and make an appointment. Patient verbalized understanding.

## 2018-08-30 NOTE — Telephone Encounter (Signed)
Pt has been experiencing strong abd pain across her belly button, she states that is similar to pain that she had last year. She said that Amy prescribed a med that helped. Pt does not remember the name of medication but is requesting a rf if possible.

## 2018-09-19 ENCOUNTER — Ambulatory Visit (INDEPENDENT_AMBULATORY_CARE_PROVIDER_SITE_OTHER): Payer: Medicare Other | Admitting: Adult Health

## 2018-09-19 ENCOUNTER — Encounter: Payer: Self-pay | Admitting: Adult Health

## 2018-09-19 DIAGNOSIS — J441 Chronic obstructive pulmonary disease with (acute) exacerbation: Secondary | ICD-10-CM | POA: Diagnosis not present

## 2018-09-19 MED ORDER — BENZONATATE 200 MG PO CAPS: 200.0000 mg | ORAL_CAPSULE | Freq: Three times a day (TID) | ORAL | 1 refills | Status: DC | PRN

## 2018-09-19 MED ORDER — AZITHROMYCIN 250 MG PO TABS: ORAL_TABLET | ORAL | 0 refills | Status: AC

## 2018-09-19 MED ORDER — PREDNISONE 10 MG PO TABS: ORAL_TABLET | ORAL | 0 refills | Status: DC

## 2018-09-19 NOTE — Assessment & Plan Note (Signed)
Exacerbation with Bronchitis /AR  Plan  Patient Instructions  Zpack take as directed.  Mucinex DM Twice daily  .As needed  Cough/congestion  Saline nasal spray As needed   Prednisone taper over next week.  Fluids and rest  Tessalon Three times a day  As needed  Cough .  Zyrtec 10mg  At bedtime  For 1 week and then as needed.  Flonase 2 puffs. At bedtime  For 1 week and then as needed.  Follow up in 4-6 weeks and As needed    Please contact office for sooner follow up if symptoms do not improve or worsen or seek emergency care

## 2018-09-19 NOTE — Patient Instructions (Addendum)
Zpack take as directed.  Mucinex DM Twice daily  .As needed  Cough/congestion  Saline nasal spray As needed   Prednisone taper over next week.  Fluids and rest  Tessalon Three times a day  As needed  Cough .  Zyrtec 10mg  At bedtime  For 1 week and then as needed.  Flonase 2 puffs. At bedtime  For 1 week and then as needed.  Follow up in 4-6 weeks and As needed    Please contact office for sooner follow up if symptoms do not improve or worsen or seek emergency care

## 2018-09-19 NOTE — Progress Notes (Signed)
@Patient  ID: Sarah Pena, female    DOB: [birth year], 78 y.o.   MRN: [id number]  Chief Complaint  Patient presents with  . Acute Visit    COPD     Referring provider: Deland Pretty, MD  HPI: 78 year old female former smoker , moderate COPD  TEST/EVENTS :  02/2015 Spirometry - severe airway obstruction - FEV1 0.9-52%, FVC 1.4-59%, ratio of 61 Spirometry 07/2016-improved FEV1 at 62%  CT angiogram 03/2008 -showed 8 mm lesion in the right apex CT chest 03/2015 >>Less than 3 mm stable bilateral upper lobe ground-glass nodules  CT chest 05/2016 - unchanged nodules  EKG>incomplete RBBB with LAHB(old)   09/19/2018 Acute OV : COPD  Presents with 2 weeks of cough, congestion , sore throat , nasal congestion , drainage .  Has been using multiple otc cold meds with nyquil and sore throat meds.  Cough is getting worse, keeping her up at night . Has increased sinus congestion and thick yellow mucus.  Intermittent wheezing and tightness. Feels tired and fatigue.  Initially had low grade fever and body aches.   Had Moderate COPD , on Trelegy daily.   Allergies  Allergen Reactions  . Statins Other (See Comments)    Patient refuses> practices holistic lifestyle    Immunization History  Administered Date(s) Administered  . Influenza, High Dose Seasonal PF 04/11/2017, 04/25/2018  . Influenza,inj,Quad PF,6+ Mos 03/13/2015, 03/26/2016  . Pneumococcal Conjugate-13 05/12/2015    Past Medical History:  Diagnosis Date  . Asthma 05/2015  . Cancer (HCC)    Melanoma of chest wall  . COPD (chronic obstructive pulmonary disease) ([place])   . Family history of heart disease   . History of hiatal hernia    'small"  . History of kidney stones   . Myocardial infarction ([place])    "mild"  years ago  . Shortness of breath dyspnea    just recently- sob walking up stairs- "I think its pollen.    Tobacco History: Social History   Tobacco Use  Smoking Status Former Smoker  . Packs/day: 1.00   . Years: 50.00  . Pack years: 50.00  . Types: Cigarettes  . Last attempt to quit: 05/22/2014  . Years since quitting: 4.3  Smokeless Tobacco Never Used  Tobacco Comment   quit 05/2014   Counseling given: Not Answered Comment: quit 05/2014   Outpatient Medications Prior to Visit  Medication Sig Dispense Refill  . albuterol (PROAIR HFA) 108 (90 Base) MCG/ACT inhaler Inhale 2 puffs into the lungs every 6 (six) hours as needed for wheezing or shortness of breath. 3 Inhaler 1  . aspirin 81 MG tablet Take 81 mg by mouth daily.    . calcium carbonate (OS-CAL - DOSED IN MG OF ELEMENTAL CALCIUM) 1250 (500 Ca) MG tablet Take 1 tablet by mouth daily with breakfast.    . calcium carbonate (TUMS - DOSED IN MG ELEMENTAL CALCIUM) 500 MG chewable tablet Chew 1 tablet by mouth daily as needed for indigestion or heartburn.    . Cholecalciferol (VITAMIN D-3) 1000 UNITS CAPS Take 1,000 Units by mouth daily.     Marland Kitchen Dextrose-Fructose-Sod Citrate (NAUZENE) [phone] MG CHEW Chew 2 tablets by mouth as needed (nausea).     . dicyclomine (BENTYL) 10 MG capsule Take 1 capsule (10 mg total) by mouth 2 (two) times daily. 60 capsule 1  . Fluticasone-Umeclidin-Vilant (TRELEGY ELLIPTA) 100-62.5-25 MCG/INH AEPB Inhale 1 puff into the lungs daily. 180 each 3  . Magnesium 250 MG TABS Take 250 mg  by mouth daily.    . OIL OF OREGANO PO Take 1 capsule by mouth daily.     Marland Kitchen OVER THE COUNTER MEDICATION Place 6 drops under the tongue 2 (two) times daily. CBD oil    . TURMERIC PO Take 1 capsule by mouth daily.      No facility-administered medications prior to visit.      Review of Systems:   Constitutional:   No  weight loss, night sweats,  Fevers, chills, + fatigue, or  lassitude.  HEENT:   No headaches,  Difficulty swallowing,  Tooth/dental problems, or  Sore throat,                No sneezing, itching, ear ache, +asal congestion, post nasal drip,   CV:  No chest pain,  Orthopnea, PND, swelling in lower  extremities, anasarca, dizziness, palpitations, syncope.   GI  No heartburn, indigestion, abdominal pain, nausea, vomiting, diarrhea, change in bowel habits, loss of appetite, bloody stools.   Resp:    No chest wall deformity  Skin: no rash or lesions.  GU: no dysuria, change in color of urine, no urgency or frequency.  No flank pain, no hematuria   MS:  No joint pain or swelling.  No decreased range of motion.  No back pain.    Physical Exam  BP 130/70 (BP Location: Left Arm, Cuff Size: Normal)   Pulse 74   Temp 98.4 F (36.9 C) (Oral)   Ht 5' (1.524 m)   Wt 115 lb 3.2 oz (52.3 kg)   SpO2 95%   BMI 22.50 kg/m   GEN: A/Ox3; pleasant , NAD, elderly    HEENT:  [place]/AT,  EACs-clear, TMs-wnl, NOSE-clear, THROAT-clear, no lesions, no postnasal drip or exudate noted.   NECK:  Supple w/ fair ROM; no JVD; normal carotid impulses w/o bruits; no thyromegaly or nodules palpated; no lymphadenopathy.    RESP  Few trace exp wheezes  no accessory muscle use, no dullness to percussion  CARD:  RRR, no m/r/g, no peripheral edema, pulses intact, no cyanosis or clubbing.  GI:   Soft & nt; nml bowel sounds; no organomegaly or masses detected.   Musco: Warm bil, no deformities or joint swelling noted.   Neuro: alert, no focal deficits noted.    Skin: Warm, no lesions or rashes    Lab Results:  CBC    Component Value Date/Time   WBC 12.8 (H) 08/22/2017 0329   RBC 4.42 08/22/2017 0329   HGB 13.4 08/22/2017 0329   HGB 13.6 08/16/2017 1556   HCT 41.2 08/22/2017 0329   HCT 41.4 08/16/2017 1556   PLT 169 08/22/2017 0329   PLT 273 08/16/2017 1556   MCV 93.2 08/22/2017 0329   MCV 90 08/16/2017 1556   MCH 30.3 08/22/2017 0329   MCHC 32.5 08/22/2017 0329   RDW 14.4 08/22/2017 0329   RDW 14.3 08/16/2017 1556   LYMPHSABS 2.8 08/16/2017 1556   MONOABS 0.6 10/29/2014 0802   EOSABS 0.5 (H) 08/16/2017 1556   BASOSABS 0.0 08/16/2017 1556    BMET    Component Value Date/Time   NA 138  08/22/2017 0329   NA 141 08/16/2017 1556   K 4.0 08/22/2017 0329   CL 103 08/22/2017 0329   CO2 25 08/22/2017 0329   GLUCOSE 90 08/22/2017 0329   BUN 14 08/22/2017 0329   BUN 12 08/16/2017 1556   CREATININE 0.56 08/22/2017 0329   CALCIUM 8.4 (L) 08/22/2017 0329   GFRNONAA >60 08/22/2017 9371  GFRAA >60 08/22/2017 0329    BNP No results found for: BNP  ProBNP No results found for: PROBNP  Imaging: No results found.    No flowsheet data found.  No results found for: NITRICOXIDE      Assessment & Plan:   COPD (chronic obstructive pulmonary disease) Exacerbation with Bronchitis /AR  Plan  Patient Instructions  Zpack take as directed.  Mucinex DM Twice daily  .As needed  Cough/congestion  Saline nasal spray As needed   Prednisone taper over next week.  Fluids and rest  Tessalon Three times a day  As needed  Cough .  Zyrtec 10mg  At bedtime  For 1 week and then as needed.  Flonase 2 puffs. At bedtime  For 1 week and then as needed.  Follow up in 4-6 weeks and As needed    Please contact office for sooner follow up if symptoms do not improve or worsen or seek emergency care         Rexene Edison, NP 09/19/2018

## 2018-10-17 ENCOUNTER — Ambulatory Visit: Payer: Medicare Other | Admitting: Adult Health

## 2018-10-29 ENCOUNTER — Other Ambulatory Visit: Payer: Self-pay | Admitting: Physician Assistant

## 2018-12-08 ENCOUNTER — Telehealth: Payer: Self-pay | Admitting: *Deleted

## 2018-12-08 NOTE — Telephone Encounter (Signed)

## 2018-12-11 ENCOUNTER — Telehealth: Payer: Self-pay | Admitting: Cardiovascular Disease

## 2018-12-11 NOTE — Telephone Encounter (Signed)
Smart phone/video visit/no my chart/pre reg complete/sonsent obtained -- ttf

## 2018-12-13 ENCOUNTER — Telehealth (INDEPENDENT_AMBULATORY_CARE_PROVIDER_SITE_OTHER): Payer: Medicare Other | Admitting: Cardiovascular Disease

## 2018-12-13 ENCOUNTER — Telehealth: Payer: Self-pay

## 2018-12-13 DIAGNOSIS — Z951 Presence of aortocoronary bypass graft: Secondary | ICD-10-CM

## 2018-12-13 DIAGNOSIS — E782 Mixed hyperlipidemia: Secondary | ICD-10-CM | POA: Diagnosis not present

## 2018-12-13 DIAGNOSIS — J441 Chronic obstructive pulmonary disease with (acute) exacerbation: Secondary | ICD-10-CM | POA: Diagnosis not present

## 2018-12-13 NOTE — Telephone Encounter (Signed)
Patient and/or DPR-approved person aware of 12/13/18 AVS instructions and verbalized understanding. Letter including After Visit Summary and any other necessary documents to be mailed to the patient's address on file.  

## 2018-12-13 NOTE — Patient Instructions (Signed)
Medication Instructions:  Your physician recommends that you continue on your current medications as directed. Please refer to the Current Medication list given to you today.  If you need a refill on your cardiac medications before your next appointment, please call your pharmacy.   Lab work: Your physician recommends that you return for lab work in 1-2 weeks: FASTING LIPID PROFILE AND LIVER FUNCTION TEST. YOU WILL RECEIVE A LAB SLIP IN THE MAIL. PLEASE DO NOT EAT OR DRINK (EXCEPT WATER) ANYTHING AFTER MIDNIGHT ON THE DAY YOU CHOOSE TO PRESENT FOR LAB WORK. YOU MAY EAT AFTER YOUR BLOOD HAS BEEN COLLECTED. NO APPOINTMENT IS NEEDED.  If you have labs (blood work) drawn today and your tests are completely normal, you will receive your results only by: Marland Kitchen MyChart Message (if you have MyChart) OR . A paper copy in the mail If you have any lab test that is abnormal or we need to change your treatment, we will call you to review the results.  Testing/Procedures: NONE  Follow-Up: At [facility], you and your health needs are our priority.  As part of our continuing mission to provide you with exceptional heart care, we have created designated Provider Care Teams.  These Care Teams include your primary Cardiologist (physician) and Advanced Practice Providers (APPs -  Physician Assistants and Nurse Practitioners) who all work together to provide you with the care you need, when you need it. You will need a follow up appointment in 12 months WITH DR. Gwenlyn Found.  Please call our office 2 months in advance to schedule this appointment.

## 2018-12-13 NOTE — Progress Notes (Signed)
Virtual Visit via Video Note   This visit type was conducted due to national recommendations for restrictions regarding the COVID-19 Pandemic (e.g. social distancing) in an effort to limit this patient's exposure and mitigate transmission in our community.  Due to her co-morbid illnesses, this patient is at least at moderate risk for complications without adequate follow up.  This format is felt to be most appropriate for this patient at this time.  All issues noted in this document were discussed and addressed.  A limited physical exam was performed with this format.  Please refer to the patient's chart for her consent to telehealth for [facility].   Date:  12/13/2018   ID:  Sarah Pena, DOB 02/09/1941, MRN [id number]  Patient Location: Home Provider Location: Home  PCP:  Deland Pretty, MD  Cardiologist:  Quay Burow, MD  Electrophysiologist:  None   Evaluation Performed:  Follow-Up Visit  Chief Complaint: Follow-up CAD  History of Present Illness:    Sarah Pena is a 78 y.o.   thin-appearing married Caucasian female patient of Dr. Katrine Coho whoI last saw in the office  09/06/2017 .She has a long history of tobacco abuse having discontinued this 3 yearsago after being treated for pneumonia. She has a strong family history of heart disease with both parents died of heart-related issues. She had a negative stress test in our office 04/16/08. She has been relatively stable until recently whenshe developed new onset substernal chest pain several weeks ago which has been occurring on a daily basis and increasing in frequency and severity. Because of the axillary nature of her symptoms I elected to proceed with diagnostic coronary angiography on 08/18/17 revealing three-vessel disease with preserved LV function. She had accelerated symptoms post catheterization went to emergency bypass surgery that night by Dr. Roxan Hockey who placed the LIMA to LAD, vein graft to obtuse marginal  branch and the PDA. Her postop course was uneventful.  Since I saw her over a year ago she is done well.  She does have shortness of breath from COPD.  She stopped smoking 5 years ago.  She does get outside and walk around her property.  She denies chest pain.  She does have significant hyperlipidemia and has declined statin therapy and Repatha in the past.  The patient does not have symptoms concerning for COVID-19 infection (fever, chills, cough, or new shortness of breath).    Past Medical History:  Diagnosis Date  . Asthma 05/2015  . Cancer (HCC)    Melanoma of chest wall  . COPD (chronic obstructive pulmonary disease) ([place])   . Family history of heart disease   . History of hiatal hernia    'small"  . History of kidney stones   . Myocardial infarction ([place])    "mild"  years ago  . Shortness of breath dyspnea    just recently- sob walking up stairs- "I think its pollen.   Past Surgical History:  Procedure Laterality Date  . APPENDECTOMY    . CHOLECYSTECTOMY N/A 10/29/2014   Procedure: LAPAROSCOPIC CHOLECYSTECTOMY WITH INTRAOPERATIVE CHOLANGIOGRAM;  Surgeon: Jackolyn Confer, MD;  Location: [place];  Service: General;  Laterality: N/A;  . COLONOSCOPY    . CORONARY ARTERY BYPASS GRAFT N/A 08/18/2017   Procedure: CORONARY ARTERY BYPASS GRAFTING (CABG) x 3, USING LEFT INTERNAL MAMMARY ARTERY AND RIGHT GREATER SAPHENOUS VEIN HARVESTED ENDOSCOPICALLY;  Surgeon: Melrose Nakayama, MD;  Location: [place];  Service: Open Heart Surgery;  Laterality: N/A;  . EYE SURGERY  Bilateral    catarct with lens replacement  . HERNIA REPAIR     RIH  . LEFT HEART CATH AND CORONARY ANGIOGRAPHY N/A 08/18/2017   Procedure: LEFT HEART CATH AND CORONARY ANGIOGRAPHY;  Surgeon: [name], MD;  Location: [place] [facility];  Service: Cardiovascular;  Laterality: N/A;  . lipoma removed  1993   RLQ area  . MELANOMA EXCISION    . TEE WITHOUT CARDIOVERSION N/A 08/18/2017   Procedure: TRANSESOPHAGEAL  ECHOCARDIOGRAM (TEE);  Surgeon: Melrose Nakayama, MD;  Location: [place];  Service: Open Heart Surgery;  Laterality: N/A;  . TUBAL LIGATION       Current Meds  Medication Sig  . albuterol (PROAIR HFA) 108 (90 Base) MCG/ACT inhaler Inhale 2 puffs into the lungs every 6 (six) hours as needed for wheezing or shortness of breath.  Marland Kitchen aspirin 81 MG tablet Take 81 mg by mouth daily.  . calcium carbonate (OS-CAL - DOSED IN MG OF ELEMENTAL CALCIUM) 1250 (500 Ca) MG tablet Take 1 tablet by mouth daily with breakfast.  . calcium carbonate (TUMS - DOSED IN MG ELEMENTAL CALCIUM) 500 MG chewable tablet Chew 1 tablet by mouth daily as needed for indigestion or heartburn.  . Cholecalciferol (VITAMIN D-3) 1000 UNITS CAPS Take 1,000 Units by mouth daily.   . Fluticasone-Umeclidin-Vilant (TRELEGY ELLIPTA) 100-62.5-25 MCG/INH AEPB Inhale 1 puff into the lungs daily.  . Magnesium 250 MG TABS Take 250 mg by mouth daily.  . OIL OF OREGANO PO Take 1 capsule by mouth daily.   Marland Kitchen OVER THE COUNTER MEDICATION Place 6 drops under the tongue 2 (two) times daily. CBD oil  . Potassium 99 MG TABS Take 99 mg by mouth daily.  . TURMERIC PO Take 1 capsule by mouth daily.   Marland Kitchen VITAMIN E PO Take by mouth.     Allergies:   Statins   Social History   Tobacco Use  . Smoking status: Former Smoker    Packs/day: 1.00    Years: 50.00    Pack years: 50.00    Types: Cigarettes    Last attempt to quit: 05/22/2014    Years since quitting: 4.5  . Smokeless tobacco: Never Used  . Tobacco comment: quit 05/2014  Substance Use Topics  . Alcohol use: Yes    Alcohol/week: 0.0 standard drinks    Comment: rarely  . Drug use: No     Family Hx: The patient's family history includes Cancer in her maternal grandfather; Heart disease in her father and mother.  ROS:   Please see the history of present illness.     All other systems reviewed and are negative.   Prior CV studies:   The following studies were reviewed today:   None  Labs/Other Tests and Data Reviewed:    EKG:  No ECG reviewed.  Recent Labs: No results found for requested labs within last 8760 hours.   Recent Lipid Panel Lab Results  Component Value Date/Time   CHOL 246 (H) 09/13/2017 09:35 AM   TRIG 141 09/13/2017 09:35 AM   HDL 57 09/13/2017 09:35 AM   CHOLHDL 4.3 09/13/2017 09:35 AM   LDLCALC 161 (H) 09/13/2017 09:35 AM    Wt Readings from Last 3 Encounters:  12/13/18 117 lb (53.1 kg)  09/19/18 115 lb 3.2 oz (52.3 kg)  07/26/18 117 lb (53.1 kg)     Objective:    Vital Signs:  BP 132/68   Pulse 69   Ht 5' (1.524 m)   Wt  117 lb (53.1 kg)   BMI 22.85 kg/m    VITAL SIGNS:  reviewed GEN:  no acute distress RESPIRATORY:  normal respiratory effort, symmetric expansion NEURO:  alert and oriented x 3, no obvious focal deficit PSYCH:  normal affect  ASSESSMENT & PLAN:    1. Coronary artery disease- history of CAD status post CABG by Dr. Roxan Hockey February 2019 with a LIMA to the LAD, vein to a obtuse marginal branch and PDA.  She denies chest pain. 2. Hyperlipidemia- history of hyperlipidemia with lipid profile performed 09/13/2017 revealing total cholesterol 246, LDL 161 and HDL 57.  The patient has declined statin therapy in the past and Repatha after being introduced to the concept today.  COVID-19 Education: The signs and symptoms of COVID-19 were discussed with the patient and how to seek care for testing (follow up with PCP or arrange E-visit).  The importance of social distancing was discussed today.  Time:   Today, I have spent 8 minutes with the patient with telehealth technology discussing the above problems.     Medication Adjustments/Labs and Tests Ordered: Current medicines are reviewed at length with the patient today.  Concerns regarding medicines are outlined above.   Tests Ordered: No orders of the defined types were placed in this encounter.   Medication Changes: No orders of the defined types were  placed in this encounter.   Disposition:  Follow up in 1 year(s)  Signed, Quay Burow, MD  12/13/2018 11:38 AM    [facility]

## 2018-12-13 NOTE — Addendum Note (Signed)
Addended by: Annita Brod on: 12/13/2018 12:50 PM   Modules accepted: Orders

## 2018-12-15 LAB — LIPID PANEL
Chol/HDL Ratio: 3.4 ratio (ref 0.0–4.4)
Cholesterol, Total: 250 mg/dL — ABNORMAL HIGH (ref 100–199)
HDL: 74 mg/dL (ref >39)
LDL Calculated: 159 mg/dL — ABNORMAL HIGH (ref 0–99)
Triglycerides: 83 mg/dL (ref 0–149)
VLDL Cholesterol Cal: 17 mg/dL (ref 5–40)

## 2018-12-15 LAB — HEPATIC FUNCTION PANEL
ALT: 14 IU/L (ref 0–32)
AST: 20 IU/L (ref 0–40)
Albumin: 4.4 g/dL (ref 3.7–4.7)
Alkaline Phosphatase: 74 IU/L (ref 39–117)
Bilirubin Total: 0.3 mg/dL (ref 0.0–1.2)
Bilirubin, Direct: 0.1 mg/dL (ref 0.00–0.40)
Total Protein: 6.5 g/dL (ref 6.0–8.5)

## 2018-12-27 ENCOUNTER — Telehealth: Payer: Self-pay | Admitting: Pharmacist Clinician (PhC)/ Clinical Pharmacy Specialist

## 2018-12-27 NOTE — Telephone Encounter (Signed)
LMOM for telephone CVRR lipid appointment

## 2018-12-27 NOTE — Telephone Encounter (Signed)
This visit type was conducted due to national recommendations for restrictions regarding the COVID-19 Pandemic (e.g. social distancing) in an effort to limit this patient's exposure and mitigate transmission in our community.  Patient was referred to CVRR clinic to discuss lipid therapy.  Total time on phone with patient: 25 minutes  Sarah Pena 78 y.o. has a cardiac history significant for ASCVD s/p CABG x 3 in February 2019.    Other medical history includes  COPD  Current medications for hyperlipidemia: none  Medications tried/failed: atorvastatin 10 mg  Family history: mother died at 37 during CABG post MI, father 67 in his 66's from MI.  Has several sisters, all healthy and one half brother (on mother's side) who has had 2 strokes.  She also has 2 daughters, one who has been seen by Dr. Gwenlyn Found.  Diet: does not follow any specific dietary plan. Tries to make healthy choices  Exercise: considers herself "hyperactive", however had a recent fall resulting in a fractured shoulder and bruised ribs.  Has limited her regular walks lately  Labs:   Date Total Cholesterol Triglycerides HDL LDL  12/2018 250 83 74 159  09/2017 246 141 57 161     Assessment/Plan:  Patient is not interested in any prescription medications.  States she only uses her daily inhaler and otherwise only natural/OTC medications.  Had a discussion about her options with this, as her LDL cholesterol needs to come down by 90 points to reach goal.  She would prefer to alter her diet, cutting back on animal products and fats.  She is willing to take OTC products, so will have her try red yeast rice and Benefiber in addition to the dietary changes.  She is aware this is not likely to get her to goal LDL.    Rockne Menghini PharmD CPP [facility]  [address] [place], [place] [address]

## 2019-01-01 ENCOUNTER — Telehealth: Payer: Self-pay | Admitting: Cardiovascular Disease

## 2019-01-01 NOTE — Telephone Encounter (Signed)
lmtcb

## 2019-01-01 NOTE — Telephone Encounter (Signed)
New Message    Patient calling for Lab results and would like them mailed out to her.

## 2019-01-01 NOTE — Telephone Encounter (Signed)
New Message     Pt is calling because she said she would receive a print out from her virtual visit in the mail and she has not yet received it  She also says she came for Lab work, and the pt said she was suppose to get a copy of her blood work in [employer].      Please call

## 2019-01-01 NOTE — Telephone Encounter (Signed)
Spoke with pt regarding lab results. Mailed 6/3 AVS and 6/5 lab results to pt per request on 6/22

## 2019-04-23 ENCOUNTER — Encounter: Payer: Self-pay | Admitting: Physician Assistant

## 2019-04-23 ENCOUNTER — Ambulatory Visit: Payer: Medicare Other | Admitting: Physician Assistant

## 2019-04-23 ENCOUNTER — Other Ambulatory Visit (INDEPENDENT_AMBULATORY_CARE_PROVIDER_SITE_OTHER): Payer: Medicare Other

## 2019-04-23 ENCOUNTER — Other Ambulatory Visit: Payer: Self-pay

## 2019-04-23 VITALS — BP 130/80 | HR 84 | Temp 97.7°F | Ht 59.0 in | Wt 122.2 lb

## 2019-04-23 DIAGNOSIS — R1032 Left lower quadrant pain: Secondary | ICD-10-CM

## 2019-04-23 DIAGNOSIS — R197 Diarrhea, unspecified: Secondary | ICD-10-CM

## 2019-04-23 DIAGNOSIS — Z86010 Personal history of colonic polyps: Secondary | ICD-10-CM

## 2019-04-23 DIAGNOSIS — Z860101 Personal history of adenomatous and serrated colon polyps: Secondary | ICD-10-CM

## 2019-04-23 HISTORY — DX: Personal history of colonic polyps: Z86.010

## 2019-04-23 HISTORY — DX: Personal history of adenomatous and serrated colon polyps: Z86.0101

## 2019-04-23 LAB — CBC WITH DIFFERENTIAL/PLATELET
Basophils Absolute: 0.1 10*3/uL (ref 0.0–0.1)
Basophils Relative: 1.1 % (ref 0.0–3.0)
Eosinophils Absolute: 0.2 10*3/uL (ref 0.0–0.7)
Eosinophils Relative: 3.1 % (ref 0.0–5.0)
HCT: 44.7 % (ref 36.0–46.0)
Hemoglobin: 14.9 g/dL (ref 12.0–15.0)
Lymphocytes Relative: 28.7 % (ref 12.0–46.0)
Lymphs Abs: 2.1 10*3/uL (ref 0.7–4.0)
MCHC: 33.4 g/dL (ref 30.0–36.0)
MCV: 91.9 fl (ref 78.0–100.0)
Monocytes Absolute: 0.8 10*3/uL (ref 0.1–1.0)
Monocytes Relative: 11.1 % (ref 3.0–12.0)
Neutro Abs: 4.2 10*3/uL (ref 1.4–7.7)
Neutrophils Relative %: 56 % (ref 43.0–77.0)
Platelets: 264 10*3/uL (ref 150.0–400.0)
RBC: 4.86 Mil/uL (ref 3.87–5.11)
RDW: 13.9 % (ref 11.5–15.5)
WBC: 7.4 10*3/uL (ref 4.0–10.5)

## 2019-04-23 LAB — BASIC METABOLIC PANEL
BUN: 14 mg/dL (ref 6–23)
CO2: 27 mEq/L (ref 19–32)
Calcium: 10 mg/dL (ref 8.4–10.5)
Chloride: 102 mEq/L (ref 96–112)
Creatinine, Ser: 0.78 mg/dL (ref 0.40–1.20)
GFR: 71.57 mL/min (ref >60.00)
Glucose, Bld: 92 mg/dL (ref 70–99)
Potassium: 4.6 mEq/L (ref 3.5–5.1)
Sodium: 137 mEq/L (ref 135–145)

## 2019-04-23 LAB — SEDIMENTATION RATE: Sed Rate: 33 mm/hr — ABNORMAL HIGH (ref 0–30)

## 2019-04-23 MED ORDER — HYOSCYAMINE SULFATE 0.125 MG SL SUBL: 0.1250 mg | SUBLINGUAL_TABLET | Freq: Four times a day (QID) | SUBLINGUAL | 6 refills | Status: DC | PRN

## 2019-04-23 NOTE — Patient Instructions (Signed)
If you are age 77 or older, your body mass index should be between 23-30. Your Body mass index is 24.69 kg/m. If this is out of the aforementioned range listed, please consider follow up with your Primary Care Provider.  If you are age 55 or younger, your body mass index should be between 19-25. Your Body mass index is 24.69 kg/m. If this is out of the aformentioned range listed, please consider follow up with your Primary Care Provider.   Your provider has requested that you go to the basement level for lab work before leaving today. Press "B" on the elevator. The lab is located at the first door on the left as you exit the elevator.  We have sent the following medications to your pharmacy for you to pick up at your convenience: Levsin  Stop Red Yeast Rice for 2 weeks to see if it's related to diarrhea.  Thank you for choosing [facility].

## 2019-04-23 NOTE — Progress Notes (Signed)
Subjective:    Patient ID: Sarah Pena, female    DOB: August 05, 1940, 78 y.o.   MRN: [id number]  HPI Sarah Pena is a pleasant 78 year old white female, known to Dr. Carlean Purl who was last seen in our office in January 2019.  She comes in today with complaints of diarrhea over the past 2 months. Patient has history of coronary artery disease is status post prior MI and CABG x3, COPD, GERD, history of adenomatous colon polyps, and is status post cholecystectomy in 2016. Her last colonoscopy was done by Dr. Earlean Shawl in 2016.  Per my prior note in 2019 patient had brought a copy of her colonoscopy from 2016 which had finding of sigmoid diverticulosis, and one 6 mm polyp in the splenic flexure, and three 7 to 9 mm polyps in the sigmoid colon were all removed.  She was advised to have follow-up in 5 years. EGD in January 2019 pertinent for prepyloric erosions.  Biopsies negative for H. pylori and she responded to a course of dicyclomine. Patient denies any recent antibiotics, no travel no known exposures.  Her only new medication is red yeast rice supplement which she was placed on by cardiology for hyperlipidemia as she did not want to take a statin.  Interestingly she said she started this around the same time the diarrhea started. He is currently having 3-5 bowel movements per day enterally after meals and says all of her stools are watery to loose.  She did have one episode of noticing a small amount of reddish blood.  She has also been having left lower quadrant abdominal pain intermittently over the past couple of my.  She says she will have days without any symptoms and then other days have which she describes as bad crampy type pain.  No associated fever or chills.  Appetite has been fine, weight has been stable.  She has had some nausea off and on and complains of feeling generally fatigued.  Review of Systems Pertinent positive and negative review of systems were noted in the above HPI section.  All other  review of systems was otherwise negative.  Outpatient Encounter Medications as of 04/23/2019  Medication Sig  . albuterol (PROAIR HFA) 108 (90 Base) MCG/ACT inhaler Inhale 2 puffs into the lungs every 6 (six) hours as needed for wheezing or shortness of breath.  . AMBULATORY NON FORMULARY MEDICATION CBD oil 6 drops by mouth twice a day  . aspirin 81 MG tablet Take 81 mg by mouth daily.  . calcium carbonate (OS-CAL - DOSED IN MG OF ELEMENTAL CALCIUM) 1250 (500 Ca) MG tablet Take 1 tablet by mouth daily with breakfast.  . calcium carbonate (TUMS - DOSED IN MG ELEMENTAL CALCIUM) 500 MG chewable tablet Chew 1 tablet by mouth daily as needed for indigestion or heartburn.  . Cholecalciferol (VITAMIN D-3) 1000 UNITS CAPS Take 1,000 Units by mouth daily.   Marland Kitchen Dextrose-Fructose-Sod Citrate (NAUZENE) [phone] MG CHEW Chew 2 tablets by mouth as needed (nausea).   . Fluticasone-Umeclidin-Vilant (TRELEGY ELLIPTA) 100-62.5-25 MCG/INH AEPB Inhale 1 puff into the lungs daily.  . Magnesium 250 MG TABS Take 250 mg by mouth daily.  . OIL OF OREGANO PO Take 1 capsule by mouth daily.   Marland Kitchen OVER THE COUNTER MEDICATION Place 6 drops under the tongue 2 (two) times daily. CBD oil  . Potassium 99 MG TABS Take 99 mg by mouth daily.  . TURMERIC PO Take 1 capsule by mouth daily.   Marland Kitchen VITAMIN E PO Take by  mouth.  . hyoscyamine (LEVSIN SL) 0.125 MG SL tablet Place 1 tablet (0.125 mg total) under the tongue every 6 (six) hours as needed for cramping (abdominal pain, diarrhea).  . [DISCONTINUED] benzonatate (TESSALON) 200 MG capsule Take 1 capsule (200 mg total) by mouth 3 (three) times daily as needed for cough. (Patient not taking: Reported on 12/13/2018)  . [DISCONTINUED] dicyclomine (BENTYL) 10 MG capsule TAKE ONE CAPSULE BY MOUTH TWICE DAILY (Patient not taking: Reported on 12/13/2018)  . [DISCONTINUED] predniSONE (DELTASONE) 10 MG tablet 4 tabs for 2 days, then 3 tabs for 2 days, 2 tabs for 2 days, then 1 tab for 2 days, then  stop (Patient not taking: Reported on 12/13/2018)   No facility-administered encounter medications on file as of 04/23/2019.    Allergies  Allergen Reactions  . Statins Other (See Comments)    Patient refuses> practices holistic lifestyle   Patient Active Problem List   Diagnosis Date Noted  . 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 Coronary artery disease with history of myocardial infarction without history of CABG 08/18/2017  . GERD (gastroesophageal reflux disease) 08/11/2017  . Solitary pulmonary nodule 03/12/2015  . Chest pain 08/06/2014  . COPD (chronic obstructive pulmonary disease) ([place]) 08/06/2014   Social History   Socioeconomic History  . Marital status: Married    Spouse name: Not on file  . Number of children: Not on file  . Years of education: Not on file  . Highest education level: Not on file  Occupational History  . Not on file  Social Needs  . Financial resource strain: Not on file  . Food insecurity    Worry: Not on file    Inability: Not on file  . Transportation needs    Medical: Not on file    Non-medical: Not on file  Tobacco Use  . Smoking status: Former Smoker    Packs/day: 1.00    Years: 50.00    Pack years: 50.00    Types: Cigarettes    Quit date: 05/22/2014    Years since quitting: 4.9  . Smokeless tobacco: Never Used  . Tobacco comment: quit 05/2014  Substance and Sexual Activity  . Alcohol use: Yes    Alcohol/week: 0.0 standard drinks    Comment: rarely  . Drug use: No  . Sexual activity: Not on file  Lifestyle  . Physical activity    Days per week: Not on file    Minutes per session: Not on file  . Stress: Not on file  Relationships  . Social Herbalist on phone: Not on file    Gets together: Not on file    Attends religious service: Not on file    Active member of club or organization: Not on file    Attends meetings of clubs or organizations: Not on file    Relationship status: Not on file  . Intimate partner violence     Fear of current or ex partner: Not on file    Emotionally abused: Not on file    Physically abused: Not on file    Forced sexual activity: Not on file  Other Topics Concern  . Not on file  Social History Narrative  . Not on file    Ms. Roa's family history includes Cancer in her maternal grandfather; Heart disease in her father and mother.      Objective:    Vitals:   04/23/19 1000  BP: 130/80  Pulse: 84  Temp: 97.7 F (  36.5 C)    Physical Exam Well-developed well-nourished elderly white female in no acute distress.  Height, Weight 122, BMI 24.6  HEENT; nontraumatic normocephalic, EOMI, PER R LA, sclera anicteric. Oropharynx; not examined/mask/Covid Neck; supple, no JVD Cardiovascular; regular rate and rhythm with S1-S2, no murmur rub or gallop Pulmonary; Clear bilaterally Abdomen; soft, she is tender in the left mid and left lower quadrant, no guarding, nondistended, no palpable mass or hepatosplenomegaly, bowel sounds are active Rectal; not done Skin; benign exam, no jaundice rash or appreciable lesions Extremities; no clubbing cyanosis or edema skin warm and dry Neuro/Psych; alert and oriented x4, grossly nonfocal mood and affect appropriate       Assessment & Plan:   #4 78 year old white female with 6 to 8-week history of persistent diarrhea and intermittent left lower quadrant abdominal pain/cramping. Etiology of symptoms is not clear, rule out medication/supplement induced i.e. red yeast rice.  Rule out infectious etiologies i.e. C. difficile, rule out segmental colitis associated with diverticulosis, rule out colonic stricture, rule out new IBD/microscopic colitis  #2 coronary artery disease status post MI and CABG 3.  History of adenomatous colon polyps-last colonoscopy 2016 and indicated for 5-year interval follow-up 4.  GERD 5.  COPD 6.  Status post cholecystectomy 2016  Plan; CBC with differential, BMET t, sed rate. GI path panel and stool for  lactoferrin Will stop red yeast rice for 2 weeks and observe for symptomatic improvement Trial of Levsin 1 p.o. every morning and every 6 hours as needed for diarrhea and/or abdominal cramping. If labs and stool studies unrevealing she will need Colonoscopy with Dr. Olene Floss PA-C 04/23/2019   Cc: Deland Pretty, MD

## 2019-04-25 ENCOUNTER — Other Ambulatory Visit: Payer: Medicare Other

## 2019-04-25 DIAGNOSIS — R197 Diarrhea, unspecified: Secondary | ICD-10-CM

## 2019-04-25 DIAGNOSIS — R1032 Left lower quadrant pain: Secondary | ICD-10-CM

## 2019-04-27 ENCOUNTER — Telehealth: Payer: Self-pay | Admitting: Physician Assistant

## 2019-04-27 NOTE — Telephone Encounter (Signed)
Pt inquired about results of stool sample.  °

## 2019-05-01 LAB — GASTROINTESTINAL PATHOGEN PANEL PCR
C. difficile Tox A/B, PCR: NOT DETECTED
Campylobacter, PCR: NOT DETECTED
Cryptosporidium, PCR: NOT DETECTED
E coli (ETEC) LT/ST PCR: NOT DETECTED
E coli (STEC) stx1/stx2, PCR: NOT DETECTED
E coli 0157, PCR: NOT DETECTED
Giardia lamblia, PCR: NOT DETECTED
Norovirus, PCR: NOT DETECTED
Rotavirus A, PCR: NOT DETECTED
Salmonella, PCR: NOT DETECTED
Shigella, PCR: NOT DETECTED

## 2019-05-01 LAB — FECAL LACTOFERRIN, QUANT
Fecal Lactoferrin: NEGATIVE
MICRO NUMBER:: 989154
SPECIMEN QUALITY:: ADEQUATE

## 2019-05-01 NOTE — Telephone Encounter (Signed)
Advised the results are not finalized yet. She states she has not felt bad for the last few days. She was unable to tolerate the sublingual hyoscyamine. She had a "terrible headache every time I took it. I stopped it."

## 2019-05-08 NOTE — Telephone Encounter (Signed)
Pt inquired about results of stool test.  °

## 2019-05-11 NOTE — Telephone Encounter (Signed)
Spoke with patient. She is aware of her negative stool studies. Her symptoms are unchanged. Continues to have the same pain in the left lower abdomen. Sometimes the pain is so severe she can "hardly stand it." Sometimes the pain is fleeting. She did not tolerate the Levsin because it caused a "terrible headache."

## 2019-05-11 NOTE — Telephone Encounter (Signed)
Pt calling back in regarding this.  Pt is requesting test results.

## 2019-05-11 NOTE — Telephone Encounter (Signed)
Next step is Colonoscopy with Dr Carlean Purl - please get her scheduled. Also - could let her try bentyl 10 mg  PO BID  For abdomianl pain /cramping #60/1 refill

## 2019-05-14 ENCOUNTER — Telehealth: Payer: Self-pay | Admitting: Internal Medicine

## 2019-05-14 MED ORDER — DICYCLOMINE HCL 10 MG PO CAPS: 10.0000 mg | ORAL_CAPSULE | Freq: Two times a day (BID) | ORAL | 1 refills | Status: DC | PRN

## 2019-05-14 NOTE — Telephone Encounter (Signed)
Incorrect script was sent in for pt last week. Per note from [place] [facility] Bentyl 10mg  bid #60 with 1 refill was supposed to be sent in for pt. Script sent to pharmacy.

## 2019-05-23 ENCOUNTER — Ambulatory Visit: Payer: Medicare Other | Admitting: *Deleted

## 2019-05-23 ENCOUNTER — Encounter: Payer: Self-pay | Admitting: Internal Medicine

## 2019-05-23 ENCOUNTER — Other Ambulatory Visit: Payer: Self-pay

## 2019-05-23 VITALS — Temp 96.9°F | Ht 59.0 in | Wt 124.2 lb

## 2019-05-23 DIAGNOSIS — Z1159 Encounter for screening for other viral diseases: Secondary | ICD-10-CM

## 2019-05-23 DIAGNOSIS — R197 Diarrhea, unspecified: Secondary | ICD-10-CM

## 2019-05-23 DIAGNOSIS — Z86010 Personal history of colonic polyps: Secondary | ICD-10-CM

## 2019-05-23 NOTE — Progress Notes (Signed)

## 2019-05-23 NOTE — Addendum Note (Signed)
Addended by: Christell Constant F on: 05/23/2019 04:50 PM   Modules accepted: Orders

## 2019-05-30 LAB — SARS CORONAVIRUS 2 (TAT 6-24 HRS): SARS Coronavirus 2: NEGATIVE

## 2019-05-31 ENCOUNTER — Other Ambulatory Visit: Payer: Self-pay

## 2019-05-31 ENCOUNTER — Ambulatory Visit (AMBULATORY_SURGERY_CENTER): Payer: Medicare Other | Admitting: Internal Medicine

## 2019-05-31 ENCOUNTER — Encounter: Payer: Self-pay | Admitting: Internal Medicine

## 2019-05-31 VITALS — BP 128/68 | HR 76 | Temp 98.4°F | Resp 15 | Ht 59.0 in | Wt 124.0 lb

## 2019-05-31 DIAGNOSIS — Z86010 Personal history of colonic polyps: Secondary | ICD-10-CM

## 2019-05-31 DIAGNOSIS — K529 Noninfective gastroenteritis and colitis, unspecified: Secondary | ICD-10-CM

## 2019-05-31 MED ORDER — SODIUM CHLORIDE 0.9 % IV SOLN: 500.0000 mL | INTRAVENOUS | Status: DC

## 2019-05-31 NOTE — Op Note (Addendum)
[place] Patient Name: Sarah Pena Procedure Date: 05/31/2019 3:13 PM MRN: [id number] Endoscopist: Gatha Mayer , MD Age: 78 Referring MD:  Date of Birth: October 02, 1940 Gender: Female Account #: [id number] Procedure:                Colonoscopy Indications:              Chronic diarrhea Medicines:                Propofol per Anesthesia, Monitored Anesthesia Care Procedure:                Pre-Anesthesia Assessment:                           - Prior to the procedure, a History and Physical                            was performed, and patient medications and                            allergies were reviewed. The patient's tolerance of                            previous anesthesia was also reviewed. The risks                            and benefits of the procedure and the sedation                            options and risks were discussed with the patient.                            All questions were answered, and informed consent                            was obtained. Prior Anticoagulants: The patient has                            taken no previous anticoagulant or antiplatelet                            agents. ASA Grade Assessment: III - A patient with                            severe systemic disease. After reviewing the risks                            and benefits, the patient was deemed in                            satisfactory condition to undergo the procedure.                           After obtaining informed consent, the colonoscope  was passed under direct vision. Throughout the                            procedure, the patient's blood pressure, pulse, and                            oxygen saturations were monitored continuously. The                            Colonoscope was introduced through the anus and                            advanced to the the terminal ileum, with                            identification of the  appendiceal orifice and IC                            valve. The colonoscopy was performed without                            difficulty. The patient tolerated the procedure                            well. The quality of the bowel preparation was                            excellent. The terminal ileum, ileocecal valve,                            appendiceal orifice, and rectum were photographed.                            The bowel preparation used was Miralax via split                            dose instruction. Scope In: 3:22:47 PM Scope Out: 3:37:32 PM Scope Withdrawal Time: 0 hours 10 minutes 51 seconds  Total Procedure Duration: 0 hours 14 minutes 45 seconds  Findings:                 The perianal and digital rectal examinations were                            normal.                           The terminal ileum appeared normal.                           The entire examined colon appeared normal on direct                            and retroflexion views.  Biopsies for histology were taken with a cold                            forceps from the ascending colon, transverse colon,                            descending colon and sigmoid colon for evaluation                            of microscopic colitis. Complications:            No immediate complications. Estimated Blood Loss:     Estimated blood loss was minimal. Impression:               - The examined portion of the ileum was normal.                           - The entire examined colon is normal on direct and                            retroflexion views.                           - Biopsies were taken with a cold forceps from the                            ascending colon, transverse colon, descending colon                            and sigmoid colon for evaluation of microscopic                            colitis.                           - Personal history of colonic polyps - 2016 - at                             least some adenomas ([place]). Recommendation:           - Patient has a contact number available for                            emergencies. The signs and symptoms of potential                            delayed complications were discussed with the                            patient. Return to normal activities tomorrow.                            Written discharge instructions were provided to the  patient.                           - Resume previous diet.                           - Continue present medications.                           - No recommendation at this time regarding repeat                            colonoscopy due to age.                           - Await pathology results. If NL review                            meds/supplements,She reports dicyclomine helping                            and sig family stressors Gatha Mayer, MD 05/31/2019 3:47:41 PM This report has been signed electronically.

## 2019-05-31 NOTE — Patient Instructions (Addendum)
Things look fine - no signs of inflammation, polyps or cancer.  Sometimes we find inflammation by examining tissue under the microscope so I took biopsies to look for microscopic colitis.  Once I see the pathology results I will call.  I appreciate the opportunity to care for you. Gatha Mayer, MD, FACG  YOU HAD AN ENDOSCOPIC PROCEDURE TODAY AT [facility]:   Refer to the procedure report that was given to you for any specific questions about what was found during the examination.  If the procedure report does not answer your questions, please call your gastroenterologist to clarify.  If you requested that your care partner not be given the details of your procedure findings, then the procedure report has been included in a sealed envelope for you to review at your convenience later.  YOU SHOULD EXPECT: Some feelings of bloating in the abdomen. Passage of more gas than usual.  Walking can help get rid of the air that was put into your GI tract during the procedure and reduce the bloating. If you had a lower endoscopy (such as a colonoscopy or flexible sigmoidoscopy) you may notice spotting of blood in your stool or on the toilet paper. If you underwent a bowel prep for your procedure, you may not have a normal bowel movement for a few days.  Please Note:  You might notice some irritation and congestion in your nose or some drainage.  This is from the oxygen used during your procedure.  There is no need for concern and it should clear up in a day or so.  SYMPTOMS TO REPORT IMMEDIATELY:   Following lower endoscopy (colonoscopy or flexible sigmoidoscopy):  Excessive amounts of blood in the stool  Significant tenderness or worsening of abdominal pains  Swelling of the abdomen that is new, acute  Fever of 100F or higher  For urgent or emergent issues, a gastroenterologist can be reached at any hour by calling [phone].   DIET:  We do recommend a small meal at first,  but then you may proceed to your regular diet.  Drink plenty of fluids but you should avoid alcoholic beverages for 24 hours.  ACTIVITY:  You should plan to take it easy for the rest of today and you should NOT DRIVE or use heavy machinery until tomorrow (because of the sedation medicines used during the test).    FOLLOW UP: Our staff will call the number listed on your records 48-72 hours following your procedure to check on you and address any questions or concerns that you may have regarding the information given to you following your procedure. If we do not reach you, we will leave a message.  We will attempt to reach you two times.  During this call, we will ask if you have developed any symptoms of COVID 19. If you develop any symptoms (ie: fever, flu-like symptoms, shortness of breath, cough etc.) before then, please call [phone].  If you test positive for Covid 19 in the 2 weeks post procedure, please call and report this information to [place].    If any biopsies were taken you will be contacted by phone or by letter within the next 1-3 weeks.  Please call us at [phone] if you have not heard about the biopsies in 3 weeks.    SIGNATURES/CONFIDENTIALITY: You and/or your care partner have signed paperwork which will be entered into your electronic medical record.  These signatures attest to the fact that that the information  above on your After Visit Summary has been reviewed and is understood.  Full responsibility of the confidentiality of this discharge information lies with you and/or your care-partner.

## 2019-05-31 NOTE — Progress Notes (Signed)
A/ox3, pleased with MAC, report to RN 

## 2019-05-31 NOTE — Progress Notes (Signed)
Temp LC v/s [place]  I have reviewed the patient's medical history in detail and updated the computerized patient record.

## 2019-06-04 ENCOUNTER — Telehealth: Payer: Self-pay | Admitting: *Deleted

## 2019-06-04 NOTE — Telephone Encounter (Signed)
  Follow up Call-  Call back number 05/31/2019 08/09/2017  Post procedure Call Back phone  # [phone]  Permission to leave phone message Yes No  comments - message not set up  Some recent data might be hidden     Patient questions:  Do you have a fever, pain , or abdominal swelling? No. Pain Score  0 *  Have you tolerated food without any problems? Yes.    Have you been able to return to your normal activities? Yes.    Do you have any questions about your discharge instructions: Diet   No. Medications  No. Follow up visit  No.  Do you have questions or concerns about your Care? No.  Actions: * If pain score is 4 or above: No action needed, pain <4.    1. Have you developed a fever since your procedure? no  2.   Have you had an respiratory symptoms (SOB or cough) since your procedure? no  3.   Have you tested positive for COVID 19 since your procedure no  4.   Have you had any family members/close contacts diagnosed with the COVID 19 since your procedure?  no   If yes to any of these questions please route to Joylene John, RN and Alphonsa Gin, Therapist, sports.

## 2019-06-13 ENCOUNTER — Telehealth: Payer: Self-pay | Admitting: Internal Medicine

## 2019-06-13 NOTE — Progress Notes (Signed)
Necie,  The biopsies did not show inflammation.  Since the dicyclomine was helpoing would continue that - coulod also take Imodium AD.  Please let me know if ?'s  Not planning a routine repeat colonoscopy.  Gatha Mayer, MD, [facility] [place]

## 2019-06-13 NOTE — Telephone Encounter (Signed)
Please advise on the results of the pathology from procedure

## 2019-06-14 ENCOUNTER — Telehealth: Payer: Self-pay | Admitting: Cardiovascular Disease

## 2019-06-14 DIAGNOSIS — E785 Hyperlipidemia, unspecified: Secondary | ICD-10-CM

## 2019-06-14 DIAGNOSIS — I2510 Atherosclerotic heart disease of native coronary artery without angina pectoris: Secondary | ICD-10-CM

## 2019-06-14 NOTE — Telephone Encounter (Signed)
New Message     Pt is calling and is asking to speak with the Pharmacist. She says about 6 months ago she started Red Yeast Rice and Benefiber and would like to discuss with the pharmacist    Please call back

## 2019-06-14 NOTE — Telephone Encounter (Signed)
Pt was told to have labs done after 6 months of being on supplements Per pt will have done after Christmas  Orders entered and also answered pt's questions re side effects of Red Yeast Rice .Encouraged pt if has questions and has computer access may look up info and review Pt verbalizes understanding

## 2019-07-10 ENCOUNTER — Other Ambulatory Visit: Payer: Self-pay | Admitting: Internal Medicine

## 2019-07-10 MED ORDER — DICYCLOMINE HCL 10 MG PO CAPS: 10.0000 mg | ORAL_CAPSULE | Freq: Two times a day (BID) | ORAL | 1 refills | Status: DC | PRN

## 2019-07-10 NOTE — Telephone Encounter (Signed)
Dicyclomine sent to pharmacy as requested

## 2019-07-19 ENCOUNTER — Other Ambulatory Visit: Payer: Self-pay | Admitting: Adult Health

## 2019-07-27 ENCOUNTER — Telehealth: Payer: Self-pay | Admitting: Pulmonary Disease

## 2019-07-27 ENCOUNTER — Encounter: Payer: Self-pay | Admitting: Pulmonary Disease

## 2019-07-27 ENCOUNTER — Other Ambulatory Visit: Payer: Self-pay

## 2019-07-27 ENCOUNTER — Telehealth (INDEPENDENT_AMBULATORY_CARE_PROVIDER_SITE_OTHER): Payer: Medicare PPO | Admitting: Pulmonary Disease

## 2019-07-27 DIAGNOSIS — J441 Chronic obstructive pulmonary disease with (acute) exacerbation: Secondary | ICD-10-CM | POA: Diagnosis not present

## 2019-07-27 DIAGNOSIS — Z87891 Personal history of nicotine dependence: Secondary | ICD-10-CM | POA: Insufficient documentation

## 2019-07-27 DIAGNOSIS — Z0000 Encounter for general adult medical examination without abnormal findings: Secondary | ICD-10-CM | POA: Insufficient documentation

## 2019-07-27 HISTORY — DX: Encounter for general adult medical examination without abnormal findings: Z00.00

## 2019-07-27 HISTORY — DX: Personal history of nicotine dependence: Z87.891

## 2019-07-27 MED ORDER — TRELEGY ELLIPTA 100-62.5-25 MCG/INH IN AEPB: 1.0000 | INHALATION_SPRAY | Freq: Every day | RESPIRATORY_TRACT | 3 refills | Status: DC

## 2019-07-27 NOTE — Assessment & Plan Note (Signed)
Plan: Complete lung cancer screening CT as scheduled

## 2019-07-27 NOTE — Patient Instructions (Addendum)
You were seen today by Lauraine Rinne, NP  for:   1. Chronic obstructive pulmonary disease   Trelegy Ellipta  >>> 1 puff daily in the morning >>>rinse mouth out after use  >>> This inhaler contains 3 medications that help manage her respiratory status, contact our office if you cannot afford this medication or unable to remain on this medication  Only use your albuterol as a rescue medication to be used if you can't catch your breath by resting or doing a relaxed purse lip breathing pattern.  - The less you use it, the better it will work when you need it. - Ok to use up to 2 puffs  every 4 hours if you must but call for immediate appointment if use goes up over your usual need - Don't leave home without it !!  (think of it like the spare tire for your car)   Note your daily symptoms > remember "red flags" for COPD:   >>>Increase in cough >>>increase in sputum production >>>increase in shortness of breath or activity  intolerance.   If you notice these symptoms, please call the office to be seen.    2. Healthcare maintenance  COVID-19 Vaccine Information can be found at: ShippingScam.co.uk For questions related to vaccine distribution or appointments, please email [email or web address] or call [phone].      We recommend today:  No orders of the defined types were placed in this encounter.  No orders of the defined types were placed in this encounter.  Meds ordered this encounter  Medications  . Fluticasone-Umeclidin-Vilant (TRELEGY ELLIPTA) 100-62.5-25 MCG/INH AEPB    Sig: Inhale 1 puff into the lungs daily.    Dispense:  180 each    Refill:  3    Order Specific Question:   Lot Number?    Answer:   [id number]    Order Specific Question:   Expiration Date?    Answer:   02/19/2019    Order Specific Question:   Manufacturer?    Answer:   GlaxoSmithKline [[id number]]    Order Specific Question:   Quantity    Answer:   2     Follow Up:    Return in about 6 months (around 01/24/2020), or if symptoms worsen or fail to improve, for Follow up with Dr. Elsworth Soho.   Please do your part to reduce the spread of COVID-19:      Reduce your risk of any infection  and COVID19 by using the similar precautions used for avoiding the common cold or flu:  Marland Kitchen Wash your hands often with soap and warm water for at least 20 seconds.  If soap and water are not readily available, use an alcohol-based hand sanitizer with at least 60% alcohol.  . If coughing or sneezing, cover your mouth and nose by coughing or sneezing into the elbow areas of your shirt or coat, into a tissue or into your sleeve (not your hands). Langley Gauss A MASK when in public  . Avoid shaking hands with others and consider head nods or verbal greetings only. . Avoid touching your eyes, nose, or mouth with unwashed hands.  . Avoid close contact with people who are sick. . Avoid places or events with large numbers of people in one location, like concerts or sporting events. . If you have some symptoms but not all symptoms, continue to monitor at home and seek medical attention if your symptoms worsen. . If you are having a medical emergency, call 911.   ADDITIONAL  [place] / e-Visit: [email or web address]         [facility]: [facility]: [id number]                   [facility]: [id number]     It is flu season:   >>> Best ways to protect herself from the flu: Receive the yearly flu vaccine, practice good hand hygiene washing with soap and also using hand sanitizer when available, eat a nutritious meals, get adequate rest, hydrate appropriately   Please contact the office if your symptoms worsen or you have concerns that you are not improving.   Thank you for choosing [facility] for your healthcare, and  for allowing [place] to partner with you on your healthcare journey. I am thankful to be able to provide care to you today.   Wyn Quaker FNP-C

## 2019-07-27 NOTE — Assessment & Plan Note (Signed)
COVID-19 Vaccine Information can be found at: [email or web address] For questions related to vaccine distribution or appointments, please email [email or web address] or call [phone].    

## 2019-07-27 NOTE — Telephone Encounter (Signed)
Called and spoke with pt stating to her that we could do a virtual visit today instead of having her come in to the office and pt verbalized understanding. Pt has been scheduled for a video visit with Aaron Edelman today at 11:30. Nothing further needed.

## 2019-07-27 NOTE — Assessment & Plan Note (Signed)
Doing quite well today  Plan: Continue Trelegy Ellipta Notify our office if you have any worsening symptoms with your breathing Continue to use rescue inhaler as needed for shortness of breath or wheezing 9-month follow-up

## 2019-07-27 NOTE — Progress Notes (Signed)
Virtual Visit via Video Note  I connected with Sarah Pena on 07/27/19 at 11:30 AM EST by a video enabled telemedicine application and verified that I am speaking with the correct person using two identifiers.  Location: Patient: Home Provider: Office - [facility] - [id number], [address], [place], [place] [address]  I discussed the limitations of evaluation and management by telemedicine and the availability of in person appointments. The patient expressed understanding and agreed to proceed. I also discussed with the patient that there may be a patient responsible charge related to this service. The patient expressed understanding and agreed to proceed.  Patient consented to consult via telephone: Yes People present and their role in pt care: Pt   History of Present Illness:  79 year old female former smoker followed in our office for COPD  Past medical history: GERD, coronary artery disease, status post CABG x3 Smoking history: Former smoker.  Quit 2015.  50-pack-year smoking history Maintenance: Trelegy Ellipta Patient of Dr. Elsworth Soho  Chief complaint: Follow up, needs med refills   79 year old female former smoker completing a video visit with our office today.  Patient doing quite well since last being seen in March/2020.  Patient completing video visit today to obtain refills for Trelegy Ellipta.  She denies any recent exacerbations.  She has to use her rescue inhaler about 3-4 times a week which is around her baseline.  She has an upcoming lung cancer screening CT scheduled for next week.  Previous lung cancer screening CT was a lung RADS 2S.  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 Packs/day: 1.00  . Years: 50.00  . Pack years: 50.00  . Types: Cigarettes  . Quit date: 05/22/2014  . Years since quitting: 5.1  Smokeless Tobacco Never Used  Tobacco Comment   quit 05/2014   Immunization History  Administered Date(s) Administered  . Influenza, High Dose Seasonal PF 04/11/2017, 04/25/2018  . Influenza,inj,Quad PF,6+ Mos 03/13/2015, 03/26/2016  . Pneumococcal Conjugate-13 05/12/2015   Assessment and Plan:  COPD (chronic obstructive pulmonary disease) Doing quite well today  Plan: Continue Trelegy Ellipta Notify our office if you have any worsening symptoms with your breathing Continue to use rescue inhaler as needed for shortness of breath or wheezing 58-month follow-up  Healthcare maintenance COVID-19 Vaccine Information can be found at: ShippingScam.co.uk For questions related to vaccine distribution or appointments, please email [email or web address] or call [phone].     Former smoker Plan: Complete lung cancer screening CT as scheduled   Follow Up Instructions:  Return in about 6 months (around 01/24/2020), or if symptoms worsen or fail to improve, for Follow up with Dr. Elsworth Soho.    I discussed the assessment and treatment plan with the patient. The patient was provided an opportunity to ask questions and all were answered. The patient agreed with the plan and demonstrated an understanding of the instructions.   The patient was advised to call back or seek an in-person evaluation if  the symptoms worsen or if the condition fails to improve as anticipated.  I provided  24 minutes of non-face-to-face time during this encounter.   Lauraine Rinne, NP

## 2019-07-31 ENCOUNTER — Other Ambulatory Visit: Payer: Self-pay

## 2019-07-31 DIAGNOSIS — I2510 Atherosclerotic heart disease of native coronary artery without angina pectoris: Secondary | ICD-10-CM

## 2019-07-31 DIAGNOSIS — E785 Hyperlipidemia, unspecified: Secondary | ICD-10-CM

## 2019-07-31 DIAGNOSIS — I252 Old myocardial infarction: Secondary | ICD-10-CM

## 2019-07-31 LAB — HEPATIC FUNCTION PANEL
ALT: 15 IU/L (ref 0–32)
AST: 17 IU/L (ref 0–40)
Albumin: 4.2 g/dL (ref 3.7–4.7)
Alkaline Phosphatase: 90 IU/L (ref 39–117)
Bilirubin Total: 0.3 mg/dL (ref 0.0–1.2)
Bilirubin, Direct: 0.08 mg/dL (ref 0.00–0.40)
Total Protein: 6.8 g/dL (ref 6.0–8.5)

## 2019-07-31 LAB — LIPID PANEL
Chol/HDL Ratio: 3.1 ratio (ref 0.0–4.4)
Cholesterol, Total: 220 mg/dL — ABNORMAL HIGH (ref 100–199)
HDL: 70 mg/dL (ref >39)
LDL Chol Calc (NIH): 136 mg/dL — ABNORMAL HIGH (ref 0–99)
Triglycerides: 79 mg/dL (ref 0–149)
VLDL Cholesterol Cal: 14 mg/dL (ref 5–40)

## 2019-08-01 ENCOUNTER — Other Ambulatory Visit: Payer: Self-pay

## 2019-08-01 ENCOUNTER — Ambulatory Visit (INDEPENDENT_AMBULATORY_CARE_PROVIDER_SITE_OTHER)
Admission: RE | Admit: 2019-08-01 | Discharge: 2019-08-01 | Disposition: A | Discharge status: Home / Self Care | Payer: Medicare PPO | Source: Ambulatory Visit | Attending: Acute Care | Admitting: Acute Care

## 2019-08-01 DIAGNOSIS — Z87891 Personal history of nicotine dependence: Secondary | ICD-10-CM | POA: Diagnosis not present

## 2019-08-01 DIAGNOSIS — Z122 Encounter for screening for malignant neoplasm of respiratory organs: Secondary | ICD-10-CM

## 2019-08-02 ENCOUNTER — Telehealth: Payer: Self-pay | Admitting: Acute Care

## 2019-08-02 NOTE — Telephone Encounter (Signed)
See result note 08/02/19. Nothing further needed.

## 2019-08-02 NOTE — Progress Notes (Signed)
Please call patient and let them  know their  low dose Ct was read as a Lung RADS 2: nodules that are benign in appearance and behavior with a very low likelihood of becoming a clinically active cancer due to size or lack of growth. Recommendation per radiology is for a repeat LDCT in 12 months. .Please let them  know we will order and schedule their  annual screening scan for 07/2020. Please let them  know there was notation of CAD on their  scan.  Please remind the patient  that this is a non-gated exam therefore degree or severity of disease  cannot be determined. Please have them  follow up with their PCP regarding potential risk factor modification, dietary therapy or pharmacologic therapy if clinically indicated. Pt.  is not  currently on statin therapy. Please place order for annual  screening scan for  07/2020 and fax results to PCP. Thanks so much. 

## 2019-08-02 NOTE — Progress Notes (Signed)
Spoke with patient to schedule Pharm D appointment t to discuss Repatha---patient states she does not take prescription medications and is not interested in the appointment

## 2019-08-06 DIAGNOSIS — Z85820 Personal history of malignant melanoma of skin: Secondary | ICD-10-CM | POA: Diagnosis not present

## 2019-08-06 DIAGNOSIS — D485 Neoplasm of uncertain behavior of skin: Secondary | ICD-10-CM | POA: Diagnosis not present

## 2019-08-06 DIAGNOSIS — L570 Actinic keratosis: Secondary | ICD-10-CM | POA: Diagnosis not present

## 2019-08-06 DIAGNOSIS — C44722 Squamous cell carcinoma of skin of right lower limb, including hip: Secondary | ICD-10-CM | POA: Diagnosis not present

## 2019-08-16 DIAGNOSIS — L821 Other seborrheic keratosis: Secondary | ICD-10-CM | POA: Diagnosis not present

## 2019-08-16 DIAGNOSIS — C44722 Squamous cell carcinoma of skin of right lower limb, including hip: Secondary | ICD-10-CM | POA: Diagnosis not present

## 2019-08-16 DIAGNOSIS — C44729 Squamous cell carcinoma of skin of left lower limb, including hip: Secondary | ICD-10-CM | POA: Diagnosis not present

## 2019-09-10 ENCOUNTER — Other Ambulatory Visit: Payer: Self-pay | Admitting: Internal Medicine

## 2019-09-12 DIAGNOSIS — C44729 Squamous cell carcinoma of skin of left lower limb, including hip: Secondary | ICD-10-CM | POA: Diagnosis not present

## 2019-09-12 DIAGNOSIS — C44722 Squamous cell carcinoma of skin of right lower limb, including hip: Secondary | ICD-10-CM | POA: Diagnosis not present

## 2019-09-12 DIAGNOSIS — L570 Actinic keratosis: Secondary | ICD-10-CM | POA: Diagnosis not present

## 2019-09-21 ENCOUNTER — Telehealth: Payer: Self-pay | Admitting: Internal Medicine

## 2019-09-21 MED ORDER — DICYCLOMINE HCL 10 MG PO CAPS: ORAL_CAPSULE | ORAL | 1 refills | Status: DC

## 2019-09-21 NOTE — Telephone Encounter (Signed)
Patient informed, left her a detailed voicemail on her cell #

## 2019-11-01 ENCOUNTER — Telehealth: Payer: Self-pay | Admitting: Pulmonary Disease

## 2019-11-01 MED ORDER — TRELEGY ELLIPTA 100-62.5-25 MCG/INH IN AEPB: 1.0000 | INHALATION_SPRAY | Freq: Every day | RESPIRATORY_TRACT | 1 refills | Status: DC

## 2019-11-01 NOTE — Telephone Encounter (Signed)
Spoke with pt and advised rx for Trelegy sent to pharmacy. Nothing further is needed.

## 2019-11-18 ENCOUNTER — Other Ambulatory Visit: Payer: Self-pay | Admitting: Internal Medicine

## 2019-11-29 DIAGNOSIS — Z90710 Acquired absence of both cervix and uterus: Secondary | ICD-10-CM | POA: Insufficient documentation

## 2019-11-29 HISTORY — DX: Acquired absence of both cervix and uterus: Z90.710

## 2019-12-06 DIAGNOSIS — S2231XA Fracture of one rib, right side, initial encounter for closed fracture: Secondary | ICD-10-CM | POA: Diagnosis not present

## 2019-12-06 DIAGNOSIS — M25511 Pain in right shoulder: Secondary | ICD-10-CM | POA: Diagnosis not present

## 2020-01-29 ENCOUNTER — Ambulatory Visit: Payer: Medicare PPO | Admitting: Adult Health

## 2020-02-04 ENCOUNTER — Encounter: Payer: Self-pay | Admitting: Adult Health

## 2020-02-04 ENCOUNTER — Ambulatory Visit: Payer: Medicare PPO | Admitting: Adult Health

## 2020-02-04 ENCOUNTER — Other Ambulatory Visit: Payer: Self-pay

## 2020-02-04 DIAGNOSIS — J449 Chronic obstructive pulmonary disease, unspecified: Secondary | ICD-10-CM

## 2020-02-04 DIAGNOSIS — L570 Actinic keratosis: Secondary | ICD-10-CM | POA: Diagnosis not present

## 2020-02-04 DIAGNOSIS — R233 Spontaneous ecchymoses: Secondary | ICD-10-CM | POA: Diagnosis not present

## 2020-02-04 DIAGNOSIS — L821 Other seborrheic keratosis: Secondary | ICD-10-CM | POA: Diagnosis not present

## 2020-02-04 DIAGNOSIS — L578 Other skin changes due to chronic exposure to nonionizing radiation: Secondary | ICD-10-CM | POA: Diagnosis not present

## 2020-02-04 MED ORDER — ALBUTEROL SULFATE HFA 108 (90 BASE) MCG/ACT IN AERS: 2.0000 | INHALATION_SPRAY | Freq: Four times a day (QID) | RESPIRATORY_TRACT | 3 refills | Status: DC | PRN

## 2020-02-04 MED ORDER — TRELEGY ELLIPTA 100-62.5-25 MCG/INH IN AEPB: 1.0000 | INHALATION_SPRAY | Freq: Every day | RESPIRATORY_TRACT | 3 refills | Status: DC

## 2020-02-04 NOTE — Patient Instructions (Signed)
Continue on TRELEGY daily , rinse after use.  Albuterol inhaler as needed.  Activity as tolerated.  Yearly CT scan as planned.  Follow up with Dr. Elsworth Soho  In 6 months and As needed

## 2020-02-04 NOTE — Assessment & Plan Note (Signed)
Compensated on present regimen   Plan  Patient Instructions  Continue on TRELEGY daily , rinse after use.  Albuterol inhaler as needed.  Activity as tolerated.  Yearly CT scan as planned.  Follow up with Dr. Elsworth Soho  In 6 months and As needed

## 2020-02-04 NOTE — Progress Notes (Signed)
@Patient  ID: Sarah Pena, female    DOB: [birth year], 79 y.o.   MRN: [id number]  Chief Complaint  Patient presents with  . 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 Says overall breathing is doing about the same. Gets winded with heavy activity , going up stairs or exposed to smell of smoke. Husband smokes .  Remains on TRELEGY . Uses Albuterol couple times a day, lately sometimes more if exposed to smell of smoke.  Remains active , does light housework. Travels. Went to [place] last week.  Looking to downsize house.    Allergies  Allergen Reactions  . Codeine   . Statins Other (See Comments)    Patient refuses> practices holistic lifestyle    Immunization History  Administered Date(s) Administered  . Fluad Quad(high Dose 65+) 03/22/2019  . Influenza, High Dose Seasonal PF 04/11/2017, 05/25/2017, 04/25/2018  . Influenza,inj,Quad PF,6+ Mos 03/13/2015, 03/26/2016  . Influenza,inj,quad, With Preservative 04/20/2017  . Janssen (J&J) SARS-COV-2 Vaccination 10/09/2019  . Pneumococcal Conjugate-13 05/12/2015  . Tdap 08/03/2018    Past Medical History:  Diagnosis Date  . Asthma 05/2015  . COPD (chronic obstructive pulmonary disease) ([place])   . Family history of heart disease   . History of hiatal hernia    'small"  . History of kidney stones   . Melanoma ([place])    Melanoma of chest wall  . Myocardial infarction ([place])    "mild"  years ago  . Shortness of breath  dyspnea    just recently- sob walking up stairs- "I think its pollen.    Tobacco History: Social History   Tobacco Use  Smoking Status Former Smoker  . Packs/day: 1.00  . Years: 50.00  . Pack years: 50.00  . Types: Cigarettes  . Quit date: 05/22/2014  . Years since quitting: 5.7  Smokeless Tobacco Never Used  Tobacco Comment   quit 05/2014   Counseling given: Not Answered Comment: quit 05/2014   Outpatient Medications Prior to Visit  Medication Sig Dispense Refill  . albuterol (PROAIR HFA) 108 (90 Base) MCG/ACT inhaler Inhale 2 puffs into the lungs every 6 (six) hours as needed for wheezing or shortness of breath. 3 Inhaler 1  . AMBULATORY NON FORMULARY MEDICATION CBD oil 6 drops by mouth twice a day    . aspirin 81 MG tablet Take 81 mg by mouth daily.    . calcium carbonate (OS-CAL - DOSED IN MG OF ELEMENTAL CALCIUM) 1250 (500 Ca) MG tablet Take 1 tablet by mouth daily with breakfast.    . calcium carbonate (TUMS - DOSED IN MG ELEMENTAL CALCIUM) 500 MG chewable tablet Chew 1 tablet by mouth daily as needed for indigestion or heartburn.    . Cholecalciferol (VITAMIN D-3) 1000 UNITS CAPS Take 1,000 Units by mouth daily.     Marland Kitchen Dextrose-Fructose-Sod Citrate (NAUZENE) [phone] MG CHEW Chew 2 tablets by mouth as needed (nausea).     . dicyclomine (BENTYL) 10 MG capsule TAKE 1  CAPSULE(10 MG) BY MOUTH TWICE DAILY AS NEEDED FOR SPASMS 60 capsule 1  . Fluticasone-Umeclidin-Vilant (TRELEGY ELLIPTA) 100-62.5-25 MCG/INH AEPB Inhale 1 puff into the lungs daily. 180 each 1  . Magnesium 250 MG TABS Take 250 mg by mouth daily.    . OIL OF OREGANO PO Take 1 capsule by mouth daily.     Marland Kitchen OVER THE COUNTER MEDICATION Place 6 drops under the tongue 2 (two) times daily. CBD oil    . Potassium 99 MG TABS Take 99 mg by mouth daily.    . TURMERIC PO Take 1 capsule by mouth daily.     Marland Kitchen VITAMIN E PO Take by mouth.     No facility-administered medications prior to visit.     Review of Systems:     Constitutional:   No  weight loss, night sweats,  Fevers, chills, + fatigue, or  lassitude.  HEENT:   No headaches,  Difficulty swallowing,  Tooth/dental problems, or  Sore throat,                No sneezing, itching, ear ache, nasal congestion, post nasal drip,   CV:  No chest pain,  Orthopnea, PND, swelling in lower extremities, anasarca, dizziness, palpitations, syncope.   GI  No heartburn, indigestion, abdominal pain, nausea, vomiting, diarrhea, change in bowel habits, loss of appetite, bloody stools.   Resp:    No chest wall deformity  Skin: no rash or lesions.  GU: no dysuria, change in color of urine, no urgency or frequency.  No flank pain, no hematuria   MS:  No joint pain or swelling.  No decreased range of motion.  No back pain.    Physical Exam  BP 118/82 (BP Location: Left Arm, Cuff Size: Normal)   Pulse 82   Ht 4\' 11"  (1.499 m)   Wt 125 lb (56.7 kg)   SpO2 94%   BMI 25.25 kg/m   GEN: A/Ox3; pleasant , NAD, well nourished    HEENT:  [place]/AT,   NOSE-clear, THROAT-clear, no lesions, no postnasal drip or exudate noted.   NECK:  Supple w/ fair ROM; no JVD; normal carotid impulses w/o bruits; no thyromegaly or nodules palpated; no lymphadenopathy.    RESP  Clear  P & A; w/o, wheezes/ rales/ or rhonchi. no accessory muscle use, no dullness to percussion  CARD:  RRR, no m/r/g, no peripheral edema, pulses intact, no cyanosis or clubbing.  GI:   Soft & nt; nml bowel sounds; no organomegaly or masses detected.   Musco: Warm bil, no deformities or joint swelling noted.   Neuro: alert, no focal deficits noted.    Skin: Warm, no lesions or rashes    Lab Results:    BNP No results found for: BNP  ProBNP No results found for: PROBNP  Imaging: No results found.    No flowsheet data found.  No results found for: NITRICOXIDE      Assessment & Plan:   COPD (chronic obstructive pulmonary disease) Compensated on present regimen   Plan  Patient  Instructions  Continue on TRELEGY daily , rinse after use.  Albuterol inhaler as needed.  Activity as tolerated.  Yearly CT scan as planned.  Follow up with Dr. Elsworth Soho  In 6 months and As needed           Rexene Edison, NP 02/04/2020

## 2020-03-18 DIAGNOSIS — E559 Vitamin D deficiency, unspecified: Secondary | ICD-10-CM | POA: Diagnosis not present

## 2020-03-18 DIAGNOSIS — M810 Age-related osteoporosis without current pathological fracture: Secondary | ICD-10-CM | POA: Diagnosis not present

## 2020-03-18 DIAGNOSIS — Z0000 Encounter for general adult medical examination without abnormal findings: Secondary | ICD-10-CM | POA: Diagnosis not present

## 2020-03-18 DIAGNOSIS — I10 Essential (primary) hypertension: Secondary | ICD-10-CM | POA: Diagnosis not present

## 2020-03-25 DIAGNOSIS — I25111 Atherosclerotic heart disease of native coronary artery with angina pectoris with documented spasm: Secondary | ICD-10-CM | POA: Diagnosis not present

## 2020-03-25 DIAGNOSIS — M810 Age-related osteoporosis without current pathological fracture: Secondary | ICD-10-CM | POA: Diagnosis not present

## 2020-03-25 DIAGNOSIS — J449 Chronic obstructive pulmonary disease, unspecified: Secondary | ICD-10-CM | POA: Diagnosis not present

## 2020-03-25 DIAGNOSIS — E875 Hyperkalemia: Secondary | ICD-10-CM | POA: Diagnosis not present

## 2020-03-25 DIAGNOSIS — Z0000 Encounter for general adult medical examination without abnormal findings: Secondary | ICD-10-CM | POA: Diagnosis not present

## 2020-05-06 DIAGNOSIS — C44519 Basal cell carcinoma of skin of other part of trunk: Secondary | ICD-10-CM | POA: Diagnosis not present

## 2020-05-06 DIAGNOSIS — L821 Other seborrheic keratosis: Secondary | ICD-10-CM | POA: Diagnosis not present

## 2020-05-06 DIAGNOSIS — L578 Other skin changes due to chronic exposure to nonionizing radiation: Secondary | ICD-10-CM | POA: Diagnosis not present

## 2020-05-06 DIAGNOSIS — L570 Actinic keratosis: Secondary | ICD-10-CM | POA: Diagnosis not present

## 2020-06-24 ENCOUNTER — Ambulatory Visit (INDEPENDENT_AMBULATORY_CARE_PROVIDER_SITE_OTHER): Payer: Medicare PPO

## 2020-06-24 ENCOUNTER — Other Ambulatory Visit: Payer: Self-pay

## 2020-06-24 ENCOUNTER — Ambulatory Visit (INDEPENDENT_AMBULATORY_CARE_PROVIDER_SITE_OTHER): Payer: Medicare PPO | Admitting: Sports Medicine

## 2020-06-24 ENCOUNTER — Encounter: Payer: Self-pay | Admitting: Sports Medicine

## 2020-06-24 DIAGNOSIS — M216X2 Other acquired deformities of left foot: Secondary | ICD-10-CM | POA: Diagnosis not present

## 2020-06-24 DIAGNOSIS — M79671 Pain in right foot: Secondary | ICD-10-CM

## 2020-06-24 DIAGNOSIS — I10 Essential (primary) hypertension: Secondary | ICD-10-CM | POA: Insufficient documentation

## 2020-06-24 DIAGNOSIS — E7800 Pure hypercholesterolemia, unspecified: Secondary | ICD-10-CM

## 2020-06-24 DIAGNOSIS — M810 Age-related osteoporosis without current pathological fracture: Secondary | ICD-10-CM | POA: Insufficient documentation

## 2020-06-24 DIAGNOSIS — M722 Plantar fascial fibromatosis: Secondary | ICD-10-CM

## 2020-06-24 DIAGNOSIS — M216X1 Other acquired deformities of right foot: Secondary | ICD-10-CM

## 2020-06-24 DIAGNOSIS — E559 Vitamin D deficiency, unspecified: Secondary | ICD-10-CM | POA: Insufficient documentation

## 2020-06-24 DIAGNOSIS — R7301 Impaired fasting glucose: Secondary | ICD-10-CM | POA: Insufficient documentation

## 2020-06-24 DIAGNOSIS — K824 Cholesterolosis of gallbladder: Secondary | ICD-10-CM | POA: Insufficient documentation

## 2020-06-24 DIAGNOSIS — Z87898 Personal history of other specified conditions: Secondary | ICD-10-CM | POA: Insufficient documentation

## 2020-06-24 HISTORY — DX: Cholesterolosis of gallbladder: K82.4

## 2020-06-24 HISTORY — DX: Personal history of other specified conditions: Z87.898

## 2020-06-24 HISTORY — DX: Vitamin D deficiency, unspecified: E55.9

## 2020-06-24 HISTORY — DX: Pure hypercholesterolemia, unspecified: E78.00

## 2020-06-24 HISTORY — DX: Impaired fasting glucose: R73.01

## 2020-06-24 HISTORY — DX: Age-related osteoporosis without current pathological fracture: M81.0

## 2020-06-24 HISTORY — DX: Essential (primary) hypertension: I10

## 2020-06-24 MED ORDER — TRIAMCINOLONE ACETONIDE 10 MG/ML IJ SUSP
10.0000 mg | Freq: Once | INTRAMUSCULAR | Status: AC
  Administered 2020-06-24: 10 mg

## 2020-06-24 NOTE — Progress Notes (Signed)
Subjective: Sarah Pena is a 79 y.o. female patient presents to office with complaint of moderate heel pain on the right for the last 2 to 3 weeks.  Patient reports that previous treatment of injection helped her for approximately 3 years.  Patient reports that she is going out of town on Thursday and wants her heel to feel better before her trip to [place].  Patient denies any other pedal complaints or new problems at this time.  Review of system noncontributory  Patient Active Problem List   Diagnosis Date Noted  . 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 History of chest pain 06/24/2020  . Hypertension 06/24/2020  . Impaired fasting glucose 06/24/2020  . Osteoporosis 06/24/2020  . Pure hypercholesterolemia 06/24/2020  . Vitamin D deficiency 06/24/2020  . Healthcare maintenance 07/27/2019  . Former smoker 07/27/2019  . Hx of adenomatous colonic polyps 04/23/2019  . 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 Coronary artery disease with history of myocardial infarction without history of CABG 08/18/2017  . GERD (gastroesophageal reflux disease) 08/11/2017  . Solitary pulmonary nodule 03/12/2015  . Chest pain 08/06/2014  . COPD (chronic obstructive pulmonary disease) ([place]) 08/06/2014    Current Outpatient Medications on File Prior to Visit  Medication Sig Dispense Refill  . albuterol (PROAIR HFA) 108 (90 Base) MCG/ACT inhaler Inhale 2 puffs into the lungs every 6 (six) hours as needed for wheezing or shortness of breath. 56 g 3  . AMBULATORY NON FORMULARY MEDICATION CBD oil 6 drops by mouth twice a day    . aspirin 81 MG tablet Take 81 mg by mouth daily.    . calcium carbonate (OS-CAL - DOSED IN MG OF ELEMENTAL CALCIUM) 1250 (500 Ca) MG tablet Take 1 tablet by mouth daily with breakfast.    . calcium carbonate (TUMS - DOSED IN MG ELEMENTAL CALCIUM) 500 MG chewable tablet Chew 1 tablet by mouth daily as needed for indigestion or heartburn.    . Cholecalciferol (VITAMIN D-3) 1000 UNITS CAPS  Take 1,000 Units by mouth daily.     Marland Kitchen Dextrose-Fructose-Sod Citrate (NAUZENE) [phone] MG CHEW Chew 2 tablets by mouth as needed (nausea).     . dicyclomine (BENTYL) 10 MG capsule TAKE 1 CAPSULE(10 MG) BY MOUTH TWICE DAILY AS NEEDED FOR SPASMS 60 capsule 1  . Fluticasone-Umeclidin-Vilant (TRELEGY ELLIPTA) 100-62.5-25 MCG/INH AEPB Inhale 1 puff into the lungs daily. 180 each 3  . Magnesium 250 MG TABS Take 250 mg by mouth daily.    . OIL OF OREGANO PO Take 1 capsule by mouth daily.     Marland Kitchen OVER THE COUNTER MEDICATION Place 6 drops under the tongue 2 (two) times daily. CBD oil    . Potassium 99 MG TABS Take 99 mg by mouth daily.    . Red Yeast Rice 600 MG CAPS See admin instructions.    . TURMERIC PO Take 1 capsule by mouth daily.     Marland Kitchen VITAMIN E PO Take by mouth.     No current facility-administered medications on file prior to visit.    Allergies  Allergen Reactions  . Codeine   . Statins Other (See Comments)    Patient refuses> practices holistic lifestyle    Objective: Physical Exam General: The patient is alert and oriented x3 in no acute distress.  Dermatology: Skin is warm, dry and supple bilateral lower extremities. Nails within normal limits. There is no erythema, edema, no eccymosis, no open lesions present. Integument is otherwise unremarkable.  Vascular: Dorsalis Pedis pulse and  Posterior Tibial pulse are 1/4 bilateral. Capillary fill time is immediate to all digits.  Neurological: Grossly intact to light touch bilateral.  Musculoskeletal: Tenderness to palpation at the medial calcaneal tubercale and through the insertion of the plantar fascia on the right foot.  No pain with compression of calcaneus bilateral. No pain with tuning fork to calcaneus bilateral. No pain with calf compression bilateral. There is decreased Ankle joint range of motion bilateral. All other joints range of motion within normal limits bilateral. Strength 5/5 in all groups bilateral.   Gait:  Unassisted, Antalgic avoid weight on right heel.  Xray, right foot Normal osseous mineralization. Joint spaces preserved. No fracture/dislocation/boney destruction. Calcaneal spur present with mild thickening of plantar fascia. No other soft tissue abnormalities or radiopaque foreign bodies.   Assessment and Plan: Problem List Items Addressed This Visit   None   Visit Diagnoses    Pain of right heel    -  Primary   Relevant Medications   triamcinolone acetonide (KENALOG) 10 MG/ML injection 10 mg (Completed) (Start on 06/24/2020  6:30 PM)   Other Relevant Orders   DG Foot Complete Right (Completed)   Plantar fasciitis       Relevant Medications   triamcinolone acetonide (KENALOG) 10 MG/ML injection 10 mg (Completed) (Start on 06/24/2020  6:30 PM)   Acquired equinus deformity of both feet       Relevant Medications   triamcinolone acetonide (KENALOG) 10 MG/ML injection 10 mg (Completed) (Start on 06/24/2020  6:30 PM)      -Complete examination performed.  -Xrays reviewed -Discussed with patient in detail the condition of plantar fasciitis, how this occurs and general treatment options. Explained both conservative and surgical treatments.  -After oral consent and aseptic prep, injected a mixture containing 1 ml of 2%  plain lidocaine, 1 ml 0.5% plain marcaine, 0.5 ml of kenalog 10 and 0.5 ml of dexamethasone phosphate into right heel. Post-injection care discussed with patient.  -Recommend good supportive shoes and advised patient to refrain from walking barefoot. -Explained and dispensed to patient daily stretching exercises. -Recommend patient to ice affected area 1-2x daily. -Patient to return to office if fails to continue to improve after injection or sooner if problems or questions arise.  Landis Martins, DPM

## 2020-06-25 ENCOUNTER — Telehealth: Payer: Self-pay | Admitting: Adult Health

## 2020-06-25 MED ORDER — DOXYCYCLINE HYCLATE 100 MG PO TABS: 100.0000 mg | ORAL_TABLET | Freq: Every day | ORAL | 0 refills | Status: DC

## 2020-06-25 NOTE — Telephone Encounter (Signed)
Primary Pulmonologist: Dr. Elsworth Soho Last office visit and with whom: 02/04/20 with TP What do we see them for (pulmonary problems): COPD Last OV assessment/plan:   COPD (chronic obstructive pulmonary disease) Compensated on present regimen   Plan  Patient Instructions  Continue on TRELEGY daily , rinse after use.  Albuterol inhaler as needed.  Activity as tolerated.  Yearly CT scan as planned.  Follow up with Dr. Elsworth Soho  In 6 months and As needed      Reason for call:  Patient started with a sore throat on Monday, then progressed to congestion , dry barking cough, she can't cough up the mucus, taking zycam OTC and another medication (not mucinex) to cough up mucux , denies fevers, body aches, or shortness of breath- did have chills 1-2 days ago. She does have tessalon that expired in 09/2018 that she is taking   RA please advise  Allergies  Allergen Reactions  . Codeine   . Statins Other (See Comments)    Patient refuses> practices holistic lifestyle    Immunization History  Administered Date(s) Administered  . Fluad Quad(high Dose 65+) 03/22/2019  . Influenza, High Dose Seasonal PF 04/11/2017, 05/25/2017, 04/25/2018  . Influenza,inj,Quad PF,6+ Mos 03/13/2015, 03/26/2016  . Influenza,inj,quad, With Preservative 04/20/2017  . Janssen (J&J) SARS-COV-2 Vaccination 10/09/2019  . Pneumococcal Conjugate-13 05/12/2015  . Tdap 08/03/2018

## 2020-06-25 NOTE — Telephone Encounter (Signed)
Doxy 100 mg daily x7, to start taking if she develops yellow or green sputum Mucinex 600 mg twice daily Plenty of oral hydration May need Covid testing if she develops fevers

## 2020-06-25 NOTE — Telephone Encounter (Signed)
Spoke with pt, aware of recs.  rx sent to preferred pharmacy.  Nothing further needed at this time- will close encounter.   

## 2020-07-17 DIAGNOSIS — Z01419 Encounter for gynecological examination (general) (routine) without abnormal findings: Secondary | ICD-10-CM | POA: Diagnosis not present

## 2020-07-17 DIAGNOSIS — Z9049 Acquired absence of other specified parts of digestive tract: Secondary | ICD-10-CM

## 2020-07-17 DIAGNOSIS — N952 Postmenopausal atrophic vaginitis: Secondary | ICD-10-CM | POA: Diagnosis not present

## 2020-07-17 DIAGNOSIS — Z90710 Acquired absence of both cervix and uterus: Secondary | ICD-10-CM

## 2020-07-17 DIAGNOSIS — C439 Malignant melanoma of skin, unspecified: Secondary | ICD-10-CM | POA: Insufficient documentation

## 2020-07-17 DIAGNOSIS — M810 Age-related osteoporosis without current pathological fracture: Secondary | ICD-10-CM | POA: Diagnosis not present

## 2020-07-17 DIAGNOSIS — Z1231 Encounter for screening mammogram for malignant neoplasm of breast: Secondary | ICD-10-CM | POA: Diagnosis not present

## 2020-07-17 DIAGNOSIS — Z6823 Body mass index (BMI) 23.0-23.9, adult: Secondary | ICD-10-CM | POA: Diagnosis not present

## 2020-07-17 HISTORY — DX: Acquired absence of other specified parts of digestive tract: Z90.49

## 2020-07-17 HISTORY — DX: Acquired absence of both cervix and uterus: Z90.710

## 2020-07-29 ENCOUNTER — Telehealth: Payer: Self-pay | Admitting: Pulmonary Disease

## 2020-07-29 DIAGNOSIS — J449 Chronic obstructive pulmonary disease, unspecified: Secondary | ICD-10-CM

## 2020-07-29 MED ORDER — TRELEGY ELLIPTA 100-62.5-25 MCG/INH IN AEPB
1.0000 | INHALATION_SPRAY | Freq: Every day | RESPIRATORY_TRACT | 3 refills | Status: DC
Start: 2020-07-29 — End: 2021-09-23

## 2020-07-29 NOTE — Telephone Encounter (Signed)
07/29/20  Called patient.  She is requesting refill of Trelegy Ellipta inhaler.  She is also switched her pharmacy to [facility].   Prescription has been sent in.  Patient is due for 25-month follow-up.  Patient has been scheduled for 28-month follow-up with Dr. Elsworth Soho on 09/08/2020 at 3:45 PM  Nothing further needed  Wyn Quaker, FNP

## 2020-09-08 ENCOUNTER — Ambulatory Visit: Payer: Medicare PPO | Admitting: Pulmonary Disease

## 2020-09-08 ENCOUNTER — Encounter: Payer: Self-pay | Admitting: Pulmonary Disease

## 2020-09-08 ENCOUNTER — Other Ambulatory Visit: Payer: Self-pay

## 2020-09-08 DIAGNOSIS — R911 Solitary pulmonary nodule: Secondary | ICD-10-CM | POA: Diagnosis not present

## 2020-09-08 DIAGNOSIS — J432 Centrilobular emphysema: Secondary | ICD-10-CM

## 2020-09-08 NOTE — Assessment & Plan Note (Signed)
Tiny pulmonary nodules have been noted and have been stable since 2017, likely benign. She has aged out of the screening program.  She does not need follow-up imaging

## 2020-09-08 NOTE — Progress Notes (Signed)
   Subjective:    Patient ID: Sarah Pena, female    DOB: [birth year], 80 y.o.   MRN: [id number]  HPI  80 yo ex- smoker For follow-up ofmoderate COPD From [place]  She quit smoking in 2016,husbandquit smoking December 2018 [place] - CABG 08/2017  She is maintained on Trelegy, this is worked well for her, no exacerbations or hospitalizations in the past 2 years.  She wonders if she needs to take this lifelong. Takes albuterol on an as-needed basis. We reviewed low-dose CT chest  Last visit we had referred her to pulmonary rehab in [place], but she did not like this program and self discontinued She does try to stay active and goes for walks with her husband   Significant tests/ events reviewed  02/2015 Spirometry - severe airway obstruction - FEV1 0.9-52%, FVC 1.4-59%, ratio of 61 Spirometry 07/2016-improved FEV1 at 62%  CT angiogram 03/2008 -showed 8 mm lesion in the right apex CT chest 03/2015 >>Less than 3 mm stable bilateral upper lobe ground-glass nodules  CT chest 05/2016 - unchanged nodules  LDCT 07/2019 stable multiple nodules, RADS 2  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 Also denies presyncope, palpitations, heartburn, abdominal pain, nausea, vomiting, diarrhea or change in bowel or urinary habits, dysuria,hematuria, rash, arthralgias, visual complaints, headache, numbness weakness or ataxia.     Objective:   Physical Exam  Gen. Pleasant, well-nourished, elderly, in no distress ENT - no thrush, no pallor/icterus,no post nasal drip Neck: No JVD, no thyromegaly, no carotid bruits Lungs: no use of accessory muscles, no dullness to percussion, decreased BL without rales or rhonchi  Cardiovascular:  Rhythm regular, heart sounds  normal, no murmurs or gallops, no peripheral edema Musculoskeletal: No deformities, no cyanosis or clubbing        Assessment & Plan:

## 2020-09-08 NOTE — Assessment & Plan Note (Signed)
Severe COPD, she has done well on Trelegy, no hospitalizations or flareups. I asked her to continue this, we discussed that she will need this lifelong. Use albuterol as needed as needed.  She will benefit from pulmonary rehab but does not want to join the program at [facility]

## 2020-09-08 NOTE — Patient Instructions (Signed)
Stay on trelegy

## 2020-10-03 DIAGNOSIS — M1612 Unilateral primary osteoarthritis, left hip: Secondary | ICD-10-CM | POA: Diagnosis not present

## 2020-10-24 DIAGNOSIS — M25552 Pain in left hip: Secondary | ICD-10-CM

## 2020-10-24 HISTORY — DX: Pain in left hip: M25.552

## 2020-11-05 DIAGNOSIS — M25552 Pain in left hip: Secondary | ICD-10-CM | POA: Diagnosis not present

## 2020-11-17 DIAGNOSIS — N958 Other specified menopausal and perimenopausal disorders: Secondary | ICD-10-CM | POA: Diagnosis not present

## 2020-11-17 DIAGNOSIS — M816 Localized osteoporosis [Lequesne]: Secondary | ICD-10-CM | POA: Diagnosis not present

## 2021-02-09 DIAGNOSIS — J00 Acute nasopharyngitis [common cold]: Secondary | ICD-10-CM | POA: Diagnosis not present

## 2021-02-09 DIAGNOSIS — Z20828 Contact with and (suspected) exposure to other viral communicable diseases: Secondary | ICD-10-CM | POA: Diagnosis not present

## 2021-02-09 DIAGNOSIS — J449 Chronic obstructive pulmonary disease, unspecified: Secondary | ICD-10-CM | POA: Diagnosis not present

## 2021-02-26 ENCOUNTER — Ambulatory Visit: Payer: Medicare PPO | Admitting: Adult Health

## 2021-03-02 ENCOUNTER — Ambulatory Visit: Payer: Medicare PPO | Admitting: Adult Health

## 2021-03-18 ENCOUNTER — Ambulatory Visit: Payer: Medicare PPO | Admitting: Adult Health

## 2021-03-18 ENCOUNTER — Other Ambulatory Visit: Payer: Self-pay

## 2021-03-18 ENCOUNTER — Encounter: Payer: Self-pay | Admitting: Adult Health

## 2021-03-18 VITALS — BP 124/64 | HR 77 | Temp 98.0°F | Ht 59.0 in | Wt 122.8 lb

## 2021-03-18 DIAGNOSIS — Z87891 Personal history of nicotine dependence: Secondary | ICD-10-CM | POA: Diagnosis not present

## 2021-03-18 DIAGNOSIS — Z23 Encounter for immunization: Secondary | ICD-10-CM

## 2021-03-18 DIAGNOSIS — R911 Solitary pulmonary nodule: Secondary | ICD-10-CM | POA: Diagnosis not present

## 2021-03-18 DIAGNOSIS — J432 Centrilobular emphysema: Secondary | ICD-10-CM

## 2021-03-18 NOTE — Patient Instructions (Addendum)
Continue on TRELEGY daily , rinse after use.  Albuterol inhaler as needed.  Activity as tolerated.  Flu shot today .  Need LDCT chest screening .  Follow up with Dr. Alva  In 6 months and As needed   

## 2021-03-18 NOTE — Progress Notes (Signed)
$'@Patient'H$  ID: Dorian Pod, female    DOB: Jan 23, 1941, 80 y.o.   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  Patient has underlying COPD with emphysema.  Patient is maintained on Trelegy inhaler.  She says overall that she is doing well on Trelegy.  She has had no flare of her cough or wheezing.  Says that she tries to stay active.  Does get short of breath with heavy activities and walking long distances.  She is had no increased albuterol use.  She does participate in the low-dose CT screening program.  She recently did not have her CT this year due to her age however new guidelines showed that she can have another CT up until age 80 Declines coivd booster.    Allergies  Allergen Reactions   Codeine    Statins Other (See Comments)    Patient refuses> practices holistic lifestyle    Immunization History  Administered Date(s) Administered   Fluad Quad(high Dose 65+) 03/22/2019   Influenza, High Dose Seasonal PF 04/11/2017, 05/25/2017, 04/25/2018   Influenza,inj,Quad PF,6+ Mos 03/13/2015, 03/26/2016   Influenza,inj,quad, With Preservative 04/20/2017   Janssen (J&J) SARS-COV-2 Vaccination 10/09/2019   Pneumococcal Conjugate-13 05/12/2015   Pneumococcal Polysaccharide-23 04/11/2013   Tdap 08/03/2018    Past Medical History:  Diagnosis Date   Asthma 05/2015   [place] disease) [place]     Family history of heart disease    History of hiatal hernia    'small"   History of kidney stones    [place]    Melanoma of chest [place]   [place]ocardial infarction [place]    "mild"  years ago   Shortness of breath dyspnea    just recently- sob walking up stairs- "I think its pollen.    Tobacco History: Social History   Tobacco Use  Smoking Status Former   Packs/day: 1.00   Years: 50.00   Pack years: 50.00   Types: Cigarettes   Quit date: 05/22/2014   Years since quitting: 6.8  Smokeless Tobacco Never  Tobacco Comments   quit 05/2014   Counseling given: Not Answered Tobacco comments: quit 05/2014   Outpatient Medications Prior to Visit  Medication Sig Dispense Refill   AMBULATORY NON FORMULARY MEDICATION CBD oil 6 drops by mouth twice a day     aspirin 81 MG tablet Take 81 mg by mouth daily.     calcium carbonate (TUMS - DOSED IN MG ELEMENTAL CALCIUM) 500 MG chewable tablet Chew 1 tablet by mouth daily as needed for indigestion or heartburn.     Cholecalciferol (VITAMIN D-3) 1000 UNITS CAPS Take 1,000 Units by mouth daily.      Dextrose-Fructose-Sod Citrate [phone] CHEW Chew 2 tablets by mouth as needed (nausea).      Fluticasone-Umeclidin-Vilant (TRELEGY ELLIPTA) 100-62.5-25 MCG/INH AEPB Inhale 1 puff into the lungs daily. 180 each 3   Magnesium 250 MG  TABS Take 250 mg by mouth daily.     OIL OF OREGANO PO Take 1 capsule by mouth daily.      OVER THE COUNTER MEDICATION Place 6 drops under the tongue 2 (two) times daily. CBD oil     Potassium 99 MG TABS Take 99 mg by mouth daily.     TURMERIC PO Take 1 capsule by mouth daily.      VITAMIN E PO Take by mouth.     albuterol (PROAIR HFA) 108 (90 Base) MCG/ACT inhaler Inhale 2 puffs into the lungs every 6 (six) hours as needed for wheezing or shortness of breath. (Patient not taking: Reported on 03/18/2021) 56 g 3   No facility-administered medications prior to visit.     Review of Systems:   Constitutional:    No  weight loss, night sweats,  Fevers, chills, fatigue, or  lassitude.  HEENT:   No headaches,  Difficulty swallowing,  Tooth/dental problems, or  Sore throat,                No sneezing, itching, ear ache, nasal congestion, post nasal drip,   CV:  No chest pain,  Orthopnea, PND, swelling in lower extremities, anasarca, dizziness, palpitations, syncope.   GI  No heartburn, indigestion, abdominal pain, nausea, vomiting, diarrhea, change in bowel habits, loss of appetite, bloody stools.   Resp:   No excess mucus, no productive cough,  No non-productive cough,  No coughing up of blood.  No change in color of mucus.  No wheezing.  No chest wall deformity  Skin: no rash or lesions.  GU: no dysuria, change in color of urine, no urgency or frequency.  No flank pain, no hematuria   MS:  No joint pain or swelling.  No decreased range of motion.  No back pain.    Physical Exam  BP 124/64 (BP Location: Left Arm, Cuff Size: Normal)   Pulse 77   Temp 98 F (36.7 C) (Temporal)   Ht '4\' 11"'$  (1.499 m)   Wt 122 lb 12.8 oz (55.7 kg)   SpO2 97%   BMI 24.80 kg/m   GEN: A/Ox3; pleasant , NAD, well nourished    HEENT:  [place]/AT,  , NOSE-clear, THROAT-clear, no lesions, no postnasal drip or exudate noted.   NECK:  Supple w/ fair ROM; no JVD; normal carotid impulses w/o bruits; no thyromegaly or nodules palpated; no lymphadenopathy.    RESP  Clear  P & A; w/o, wheezes/ rales/ or rhonchi. no accessory muscle use, no dullness to percussion  CARD:  RRR, no m/r/g, no peripheral edema, pulses intact, no cyanosis or clubbing.  GI:   Soft & nt; nml bowel sounds; no organomegaly or masses detected.   Musco: Warm bil, no deformities or joint swelling noted.   Neuro: alert, no focal deficits noted.    Skin: Warm, no lesions or rashes    Lab Results:  CBC    Component Value Date/Time   WBC 7.4 04/23/2019 1123   RBC 4.86 04/23/2019 1123   HGB 14.9 04/23/2019 1123   HGB 13.6 08/16/2017 1556   HCT  44.7 04/23/2019 1123   HCT 41.4 08/16/2017 1556   PLT 264.0 04/23/2019 1123   PLT 273 08/16/2017 1556   MCV 91.9 04/23/2019 1123   MCV 90 08/16/2017 1556   MCH 30.3 08/22/2017 0329   MCHC 33.4 04/23/2019 1123   RDW 13.9 04/23/2019 1123   RDW 14.3 08/16/2017 1556   LYMPHSABS 2.1 04/23/2019 1123  LYMPHSABS 2.8 08/16/2017 1556   MONOABS 0.8 04/23/2019 1123   EOSABS 0.2 04/23/2019 1123   EOSABS 0.5 (H) 08/16/2017 1556   BASOSABS 0.1 04/23/2019 1123   BASOSABS 0.0 08/16/2017 1556    BMET    Component Value Date/Time   NA 137 04/23/2019 1123   NA 141 08/16/2017 1556   K 4.6 04/23/2019 1123   CL 102 04/23/2019 1123   CO2 27 04/23/2019 1123   GLUCOSE 92 04/23/2019 1123   BUN 14 04/23/2019 1123   BUN 12 08/16/2017 1556   CREATININE 0.78 04/23/2019 1123   CALCIUM 10.0 04/23/2019 1123   GFRNONAA >60 08/22/2017 0329   GFRAA >60 08/22/2017 0329    BNP No results found for: BNP  ProBNP No results found for: PROBNP  Imaging: No results found.    No flowsheet data found.  No results found for: NITRICOXIDE      Assessment & Plan:   No problem-specific Assessment & Plan notes found for this encounter.     Rexene Edison, NP 03/18/2021

## 2021-03-20 NOTE — Assessment & Plan Note (Signed)
Compensated on present regimen. Continue with low-dose CT screening program referral was sent to the lung cancer screening program to reenrolled patient  Plan  Patient Instructions  Continue on TRELEGY daily , rinse after use.  Albuterol inhaler as needed.  Activity as tolerated.  Flu shot today .  Need LDCT chest screening .  Follow up with Dr. Elsworth Soho  In 6 months and As needed

## 2021-05-05 ENCOUNTER — Other Ambulatory Visit: Payer: Self-pay | Admitting: *Deleted

## 2021-05-05 DIAGNOSIS — Z87891 Personal history of nicotine dependence: Secondary | ICD-10-CM

## 2021-05-20 DIAGNOSIS — E559 Vitamin D deficiency, unspecified: Secondary | ICD-10-CM | POA: Diagnosis not present

## 2021-05-20 DIAGNOSIS — Z0000 Encounter for general adult medical examination without abnormal findings: Secondary | ICD-10-CM | POA: Diagnosis not present

## 2021-05-20 DIAGNOSIS — R7301 Impaired fasting glucose: Secondary | ICD-10-CM | POA: Diagnosis not present

## 2021-05-27 DIAGNOSIS — E7800 Pure hypercholesterolemia, unspecified: Secondary | ICD-10-CM | POA: Diagnosis not present

## 2021-05-27 DIAGNOSIS — Z0000 Encounter for general adult medical examination without abnormal findings: Secondary | ICD-10-CM | POA: Diagnosis not present

## 2021-05-27 DIAGNOSIS — J449 Chronic obstructive pulmonary disease, unspecified: Secondary | ICD-10-CM | POA: Diagnosis not present

## 2021-05-27 DIAGNOSIS — Z951 Presence of aortocoronary bypass graft: Secondary | ICD-10-CM | POA: Diagnosis not present

## 2021-05-27 DIAGNOSIS — I25111 Atherosclerotic heart disease of native coronary artery with angina pectoris with documented spasm: Secondary | ICD-10-CM | POA: Diagnosis not present

## 2021-06-09 DIAGNOSIS — Z961 Presence of intraocular lens: Secondary | ICD-10-CM | POA: Diagnosis not present

## 2021-06-09 DIAGNOSIS — Z9841 Cataract extraction status, right eye: Secondary | ICD-10-CM | POA: Diagnosis not present

## 2021-06-09 DIAGNOSIS — H5212 Myopia, left eye: Secondary | ICD-10-CM | POA: Diagnosis not present

## 2021-06-09 DIAGNOSIS — H35363 Drusen (degenerative) of macula, bilateral: Secondary | ICD-10-CM | POA: Diagnosis not present

## 2021-06-09 DIAGNOSIS — H52222 Regular astigmatism, left eye: Secondary | ICD-10-CM | POA: Diagnosis not present

## 2021-06-09 DIAGNOSIS — H524 Presbyopia: Secondary | ICD-10-CM | POA: Diagnosis not present

## 2021-06-09 DIAGNOSIS — H353131 Nonexudative age-related macular degeneration, bilateral, early dry stage: Secondary | ICD-10-CM | POA: Diagnosis not present

## 2021-06-09 DIAGNOSIS — H26492 Other secondary cataract, left eye: Secondary | ICD-10-CM | POA: Diagnosis not present

## 2021-06-24 DIAGNOSIS — H26492 Other secondary cataract, left eye: Secondary | ICD-10-CM | POA: Diagnosis not present

## 2021-07-27 DIAGNOSIS — R87615 Unsatisfactory cytologic smear of cervix: Secondary | ICD-10-CM | POA: Diagnosis not present

## 2021-07-27 DIAGNOSIS — N952 Postmenopausal atrophic vaginitis: Secondary | ICD-10-CM | POA: Diagnosis not present

## 2021-07-27 DIAGNOSIS — Z124 Encounter for screening for malignant neoplasm of cervix: Secondary | ICD-10-CM | POA: Diagnosis not present

## 2021-07-27 DIAGNOSIS — Z1272 Encounter for screening for malignant neoplasm of vagina: Secondary | ICD-10-CM | POA: Diagnosis not present

## 2021-07-27 DIAGNOSIS — Z1231 Encounter for screening mammogram for malignant neoplasm of breast: Secondary | ICD-10-CM | POA: Diagnosis not present

## 2021-07-27 DIAGNOSIS — Z6824 Body mass index (BMI) 24.0-24.9, adult: Secondary | ICD-10-CM | POA: Diagnosis not present

## 2021-07-27 DIAGNOSIS — M810 Age-related osteoporosis without current pathological fracture: Secondary | ICD-10-CM | POA: Diagnosis not present

## 2021-07-29 DIAGNOSIS — H40013 Open angle with borderline findings, low risk, bilateral: Secondary | ICD-10-CM | POA: Diagnosis not present

## 2021-07-30 DIAGNOSIS — R079 Chest pain, unspecified: Secondary | ICD-10-CM | POA: Diagnosis not present

## 2021-07-30 DIAGNOSIS — I25110 Atherosclerotic heart disease of native coronary artery with unstable angina pectoris: Secondary | ICD-10-CM | POA: Diagnosis not present

## 2021-07-30 DIAGNOSIS — I249 Acute ischemic heart disease, unspecified: Secondary | ICD-10-CM | POA: Diagnosis not present

## 2021-07-30 DIAGNOSIS — I214 Non-ST elevation (NSTEMI) myocardial infarction: Secondary | ICD-10-CM | POA: Diagnosis not present

## 2021-07-30 DIAGNOSIS — J449 Chronic obstructive pulmonary disease, unspecified: Secondary | ICD-10-CM | POA: Diagnosis not present

## 2021-07-30 DIAGNOSIS — I2510 Atherosclerotic heart disease of native coronary artery without angina pectoris: Secondary | ICD-10-CM | POA: Diagnosis not present

## 2021-07-31 DIAGNOSIS — Z87442 Personal history of urinary calculi: Secondary | ICD-10-CM | POA: Diagnosis not present

## 2021-07-31 DIAGNOSIS — J431 Panlobular emphysema: Secondary | ICD-10-CM | POA: Diagnosis not present

## 2021-07-31 DIAGNOSIS — I214 Non-ST elevation (NSTEMI) myocardial infarction: Secondary | ICD-10-CM | POA: Diagnosis not present

## 2021-07-31 DIAGNOSIS — I25710 Atherosclerosis of autologous vein coronary artery bypass graft(s) with unstable angina pectoris: Secondary | ICD-10-CM | POA: Diagnosis not present

## 2021-07-31 DIAGNOSIS — Z79899 Other long term (current) drug therapy: Secondary | ICD-10-CM | POA: Diagnosis not present

## 2021-07-31 DIAGNOSIS — I25110 Atherosclerotic heart disease of native coronary artery with unstable angina pectoris: Secondary | ICD-10-CM | POA: Diagnosis present

## 2021-07-31 DIAGNOSIS — J449 Chronic obstructive pulmonary disease, unspecified: Secondary | ICD-10-CM | POA: Diagnosis not present

## 2021-07-31 DIAGNOSIS — K219 Gastro-esophageal reflux disease without esophagitis: Secondary | ICD-10-CM | POA: Diagnosis not present

## 2021-07-31 DIAGNOSIS — I10 Essential (primary) hypertension: Secondary | ICD-10-CM | POA: Diagnosis not present

## 2021-07-31 DIAGNOSIS — I4510 Unspecified right bundle-branch block: Secondary | ICD-10-CM | POA: Diagnosis not present

## 2021-07-31 DIAGNOSIS — I2582 Chronic total occlusion of coronary artery: Secondary | ICD-10-CM | POA: Diagnosis not present

## 2021-07-31 DIAGNOSIS — Z87891 Personal history of nicotine dependence: Secondary | ICD-10-CM | POA: Diagnosis not present

## 2021-07-31 DIAGNOSIS — R079 Chest pain, unspecified: Secondary | ICD-10-CM | POA: Diagnosis not present

## 2021-07-31 DIAGNOSIS — I2510 Atherosclerotic heart disease of native coronary artery without angina pectoris: Secondary | ICD-10-CM | POA: Diagnosis not present

## 2021-07-31 DIAGNOSIS — I25700 Atherosclerosis of coronary artery bypass graft(s), unspecified, with unstable angina pectoris: Secondary | ICD-10-CM | POA: Diagnosis not present

## 2021-07-31 DIAGNOSIS — E785 Hyperlipidemia, unspecified: Secondary | ICD-10-CM | POA: Diagnosis not present

## 2021-07-31 DIAGNOSIS — Z8249 Family history of ischemic heart disease and other diseases of the circulatory system: Secondary | ICD-10-CM | POA: Diagnosis not present

## 2021-07-31 DIAGNOSIS — Z85820 Personal history of malignant melanoma of skin: Secondary | ICD-10-CM | POA: Diagnosis not present

## 2021-07-31 DIAGNOSIS — Z7982 Long term (current) use of aspirin: Secondary | ICD-10-CM | POA: Diagnosis not present

## 2021-07-31 DIAGNOSIS — Z7951 Long term (current) use of inhaled steroids: Secondary | ICD-10-CM | POA: Diagnosis not present

## 2021-07-31 DIAGNOSIS — Z951 Presence of aortocoronary bypass graft: Secondary | ICD-10-CM | POA: Diagnosis not present

## 2021-07-31 DIAGNOSIS — E7800 Pure hypercholesterolemia, unspecified: Secondary | ICD-10-CM | POA: Diagnosis not present

## 2021-07-31 DIAGNOSIS — I252 Old myocardial infarction: Secondary | ICD-10-CM | POA: Diagnosis not present

## 2021-07-31 DIAGNOSIS — I249 Acute ischemic heart disease, unspecified: Secondary | ICD-10-CM | POA: Diagnosis not present

## 2021-07-31 DIAGNOSIS — I25119 Atherosclerotic heart disease of native coronary artery with unspecified angina pectoris: Secondary | ICD-10-CM | POA: Diagnosis not present

## 2021-08-01 DIAGNOSIS — J449 Chronic obstructive pulmonary disease, unspecified: Secondary | ICD-10-CM

## 2021-08-01 DIAGNOSIS — I25119 Atherosclerotic heart disease of native coronary artery with unspecified angina pectoris: Secondary | ICD-10-CM

## 2021-08-01 DIAGNOSIS — I209 Angina pectoris, unspecified: Secondary | ICD-10-CM | POA: Diagnosis not present

## 2021-08-01 DIAGNOSIS — I2510 Atherosclerotic heart disease of native coronary artery without angina pectoris: Secondary | ICD-10-CM | POA: Diagnosis not present

## 2021-08-02 ENCOUNTER — Inpatient Hospital Stay
Admission: AD | Admit: 2021-08-02 | Payer: Medicare PPO | Source: Other Acute Inpatient Hospital | Admitting: Cardiovascular Disease

## 2021-08-02 DIAGNOSIS — I209 Angina pectoris, unspecified: Secondary | ICD-10-CM | POA: Diagnosis not present

## 2021-08-02 DIAGNOSIS — J449 Chronic obstructive pulmonary disease, unspecified: Secondary | ICD-10-CM | POA: Diagnosis not present

## 2021-08-02 DIAGNOSIS — I2510 Atherosclerotic heart disease of native coronary artery without angina pectoris: Secondary | ICD-10-CM | POA: Diagnosis not present

## 2021-08-02 DIAGNOSIS — I25119 Atherosclerotic heart disease of native coronary artery with unspecified angina pectoris: Secondary | ICD-10-CM | POA: Diagnosis not present

## 2021-08-03 DIAGNOSIS — I25119 Atherosclerotic heart disease of native coronary artery with unspecified angina pectoris: Secondary | ICD-10-CM | POA: Diagnosis not present

## 2021-08-03 DIAGNOSIS — I209 Angina pectoris, unspecified: Secondary | ICD-10-CM | POA: Diagnosis not present

## 2021-08-03 DIAGNOSIS — I2510 Atherosclerotic heart disease of native coronary artery without angina pectoris: Secondary | ICD-10-CM | POA: Diagnosis not present

## 2021-08-03 DIAGNOSIS — J449 Chronic obstructive pulmonary disease, unspecified: Secondary | ICD-10-CM | POA: Diagnosis not present

## 2021-08-04 ENCOUNTER — Inpatient Hospital Stay (HOSPITAL_COMMUNITY)
Admission: AD | Admit: 2021-08-04 | Discharge: 2021-08-06 | DRG: 251 | Disposition: A | Discharge status: Home / Self Care | Payer: Medicare PPO | Source: Other Acute Inpatient Hospital | Attending: Cardiovascular Disease | Admitting: Cardiovascular Disease

## 2021-08-04 ENCOUNTER — Encounter (HOSPITAL_COMMUNITY)
Admission: AD | Disposition: A | Discharge status: Home / Self Care | Payer: Self-pay | Source: Other Acute Inpatient Hospital | Attending: Cardiovascular Disease

## 2021-08-04 DIAGNOSIS — Z7982 Long term (current) use of aspirin: Secondary | ICD-10-CM | POA: Diagnosis not present

## 2021-08-04 DIAGNOSIS — Z87891 Personal history of nicotine dependence: Secondary | ICD-10-CM | POA: Diagnosis not present

## 2021-08-04 DIAGNOSIS — Z79899 Other long term (current) drug therapy: Secondary | ICD-10-CM | POA: Diagnosis not present

## 2021-08-04 DIAGNOSIS — I25810 Atherosclerosis of coronary artery bypass graft(s) without angina pectoris: Secondary | ICD-10-CM | POA: Diagnosis present

## 2021-08-04 DIAGNOSIS — Z7951 Long term (current) use of inhaled steroids: Secondary | ICD-10-CM | POA: Diagnosis not present

## 2021-08-04 DIAGNOSIS — Z9861 Coronary angioplasty status: Secondary | ICD-10-CM

## 2021-08-04 DIAGNOSIS — I25700 Atherosclerosis of coronary artery bypass graft(s), unspecified, with unstable angina pectoris: Secondary | ICD-10-CM | POA: Diagnosis not present

## 2021-08-04 DIAGNOSIS — I200 Unstable angina: Secondary | ICD-10-CM

## 2021-08-04 DIAGNOSIS — Z87442 Personal history of urinary calculi: Secondary | ICD-10-CM

## 2021-08-04 DIAGNOSIS — I25110 Atherosclerotic heart disease of native coronary artery with unstable angina pectoris: Secondary | ICD-10-CM

## 2021-08-04 DIAGNOSIS — I4510 Unspecified right bundle-branch block: Secondary | ICD-10-CM | POA: Diagnosis not present

## 2021-08-04 DIAGNOSIS — I214 Non-ST elevation (NSTEMI) myocardial infarction: Secondary | ICD-10-CM

## 2021-08-04 DIAGNOSIS — I2510 Atherosclerotic heart disease of native coronary artery without angina pectoris: Secondary | ICD-10-CM | POA: Diagnosis not present

## 2021-08-04 DIAGNOSIS — Z8249 Family history of ischemic heart disease and other diseases of the circulatory system: Secondary | ICD-10-CM | POA: Diagnosis not present

## 2021-08-04 DIAGNOSIS — E785 Hyperlipidemia, unspecified: Secondary | ICD-10-CM | POA: Diagnosis not present

## 2021-08-04 DIAGNOSIS — Z85820 Personal history of malignant melanoma of skin: Secondary | ICD-10-CM

## 2021-08-04 DIAGNOSIS — I209 Angina pectoris, unspecified: Secondary | ICD-10-CM | POA: Diagnosis not present

## 2021-08-04 DIAGNOSIS — I252 Old myocardial infarction: Secondary | ICD-10-CM | POA: Diagnosis not present

## 2021-08-04 DIAGNOSIS — I25710 Atherosclerosis of autologous vein coronary artery bypass graft(s) with unstable angina pectoris: Secondary | ICD-10-CM | POA: Diagnosis not present

## 2021-08-04 DIAGNOSIS — E7800 Pure hypercholesterolemia, unspecified: Secondary | ICD-10-CM | POA: Diagnosis present

## 2021-08-04 DIAGNOSIS — I2582 Chronic total occlusion of coronary artery: Secondary | ICD-10-CM | POA: Diagnosis not present

## 2021-08-04 DIAGNOSIS — I10 Essential (primary) hypertension: Secondary | ICD-10-CM | POA: Diagnosis present

## 2021-08-04 DIAGNOSIS — J449 Chronic obstructive pulmonary disease, unspecified: Secondary | ICD-10-CM

## 2021-08-04 DIAGNOSIS — I25119 Atherosclerotic heart disease of native coronary artery with unspecified angina pectoris: Secondary | ICD-10-CM | POA: Diagnosis not present

## 2021-08-04 DIAGNOSIS — Z951 Presence of aortocoronary bypass graft: Secondary | ICD-10-CM

## 2021-08-04 HISTORY — DX: Unstable angina: I20.0

## 2021-08-04 SURGERY — LEFT HEART CATH AND CORS/GRAFTS ANGIOGRAPHY
Anesthesia: LOCAL

## 2021-08-04 MED ORDER — SODIUM CHLORIDE 0.9 % IV SOLN: 250.0000 mL | INTRAVENOUS | Status: DC | PRN

## 2021-08-04 MED ORDER — SODIUM CHLORIDE 0.9% FLUSH: 3.0000 mL | Freq: Two times a day (BID) | INTRAVENOUS | Status: DC

## 2021-08-04 MED ORDER — UMECLIDINIUM BROMIDE 62.5 MCG/ACT IN AEPB
1.0000 | INHALATION_SPRAY | Freq: Every day | RESPIRATORY_TRACT | Status: DC
  Filled 2021-08-04: qty 7

## 2021-08-04 MED ORDER — HEPARIN (PORCINE) 25000 UT/250ML-% IV SOLN
750.0000 [IU]/h | INTRAVENOUS | Status: DC
  Administered 2021-08-04: 23:00:00 700 [IU]/h via INTRAVENOUS
  Filled 2021-08-04: qty 250

## 2021-08-04 MED ORDER — MAGNESIUM 200 MG PO TABS
250.0000 mg | ORAL_TABLET | Freq: Two times a day (BID) | ORAL | Status: DC
  Administered 2021-08-04: 23:00:00 250 mg via ORAL
  Filled 2021-08-04: qty 1
  Filled 2021-08-04 (×3): qty 2

## 2021-08-04 MED ORDER — SODIUM CHLORIDE 0.9 % WEIGHT BASED INFUSION
3.0000 mL/kg/h | INTRAVENOUS | Status: AC
  Administered 2021-08-04: 23:00:00 3 mL/kg/h via INTRAVENOUS

## 2021-08-04 MED ORDER — ACETAMINOPHEN 325 MG PO TABS: 650.0000 mg | ORAL_TABLET | ORAL | Status: DC | PRN

## 2021-08-04 MED ORDER — NITROGLYCERIN 0.4 MG SL SUBL: 0.4000 mg | SUBLINGUAL_TABLET | SUBLINGUAL | Status: DC | PRN

## 2021-08-04 MED ORDER — ALBUTEROL SULFATE (2.5 MG/3ML) 0.083% IN NEBU
3.0000 mL | INHALATION_SOLUTION | Freq: Four times a day (QID) | RESPIRATORY_TRACT | Status: DC | PRN
  Filled 2021-08-04: qty 3

## 2021-08-04 MED ORDER — SODIUM CHLORIDE 0.9% FLUSH
3.0000 mL | Freq: Two times a day (BID) | INTRAVENOUS | Status: DC
  Administered 2021-08-05: 08:00:00 3 mL via INTRAVENOUS

## 2021-08-04 MED ORDER — SODIUM CHLORIDE 0.9% FLUSH: 3.0000 mL | INTRAVENOUS | Status: DC | PRN

## 2021-08-04 MED ORDER — ASPIRIN 81 MG PO CHEW
81.0000 mg | CHEWABLE_TABLET | Freq: Every day | ORAL | Status: DC
  Filled 2021-08-04: qty 1

## 2021-08-04 MED ORDER — ONDANSETRON HCL 4 MG/2ML IJ SOLN: 4.0000 mg | Freq: Four times a day (QID) | INTRAMUSCULAR | Status: DC | PRN

## 2021-08-04 MED ORDER — ASPIRIN 81 MG PO CHEW: 81.0000 mg | CHEWABLE_TABLET | ORAL | Status: DC

## 2021-08-04 MED ORDER — VITAMIN D 25 MCG (1000 UNIT) PO TABS
1000.0000 [IU] | ORAL_TABLET | Freq: Every day | ORAL | Status: DC
  Administered 2021-08-04 – 2021-08-06 (×2): 1000 [IU] via ORAL
  Filled 2021-08-04 (×3): qty 1

## 2021-08-04 MED ORDER — SODIUM CHLORIDE 0.9 % WEIGHT BASED INFUSION: 1.0000 mL/kg/h | INTRAVENOUS | Status: DC

## 2021-08-04 MED ORDER — ALPRAZOLAM 0.25 MG PO TABS: 0.2500 mg | ORAL_TABLET | Freq: Two times a day (BID) | ORAL | Status: DC | PRN

## 2021-08-04 MED ORDER — SODIUM CHLORIDE 0.9 % IV SOLN
250.0000 mL | INTRAVENOUS | Status: DC | PRN
  Administered 2021-08-05: 10:00:00 100 mL via INTRAVENOUS

## 2021-08-04 MED ORDER — FLUTICASONE-UMECLIDIN-VILANT 100-62.5-25 MCG/ACT IN AEPB: 1.0000 | INHALATION_SPRAY | Freq: Every day | RESPIRATORY_TRACT | Status: DC

## 2021-08-04 MED ORDER — ZOLPIDEM TARTRATE 5 MG PO TABS: 5.0000 mg | ORAL_TABLET | Freq: Every evening | ORAL | Status: DC | PRN

## 2021-08-04 MED ORDER — FLUTICASONE FUROATE-VILANTEROL 100-25 MCG/ACT IN AEPB
1.0000 | INHALATION_SPRAY | Freq: Every day | RESPIRATORY_TRACT | Status: DC
  Filled 2021-08-04: qty 28

## 2021-08-04 NOTE — H&P (Signed)
Cardiology Admission History and Physical:   Patient ID: Sarah Pena MRN: [id number]; DOB: [birth year]   Admission date: 08/04/2021  PCP:  Deland Pretty, MD   [facility] Providers Cardiologist:  Quay Burow, MD   {  Chief Complaint:  Chest pain   Patient Profile:   Sarah Pena is a 81 y.o. female with CAD s/p 3v CABG, HTN, HLD, tobacco use, COPD, asthma who is being seen 08/04/2021 for the evaluation of chest pain.  History of Present Illness:   Sarah Pena is a 81 year old female with past medical history noted above.  She was seen back in 2019 and underwent outpatient cardiac catheterization which showed significant three-vessel disease.  Ultimately underwent emergent three-vessel CABG (LIMA to LAD, SVG to OM and SVG to PDA).  Postoperative course was uneventful.  She was discharged home on aspirin, beta-blocker, calcium channel blocker and ACE inhibitors.  She did follow-up in the office several weeks later and reported she had not been taking all of her blood pressure medications.  She was instructed to do so and to participate in cardiac rehab.  She was seen through a follow-up telehealth visit on 12/2018 with Dr. Gwenlyn Found.  Stated she had stopped smoking at that point. She declined continuing on statin or Repatha.   Presented to the ED at [facility] on 1/19 with complaints of severe chest pain which was relieved with aspirin and sublingual nitroglycerin at home.  Reports she has been having intermittent chest pain over the past couple of days with radiation into her arm as well as numbness which had been cyclic for the past several days.  She was ultimately brought to the ED by her husband.  Labs in the ED showed sodium 136, potassium 4, creatinine 1.9, troponin I 0.07>> 0.09, WBC 10, hemoglobin 13.9. CXR negative. EKG showed SR 72 bpm, RBBB with nonspecific TW changes. She was seen by cardiology, Dr. Bettina Gavia and started on IV heparin. Recommendations for transfer to [facility] for cardiac cath.     Past Medical History:  Diagnosis Date   Asthma 05/2015   COPD (chronic obstructive pulmonary disease) (HCC)    Family history of heart disease    History of hiatal hernia    'small"   History of kidney stones    Melanoma ([place])    Melanoma of chest wall   Myocardial infarction ([place])    "mild"  years ago   Shortness of breath dyspnea    just recently- sob walking up stairs- "I think its pollen.    Past Surgical History:  Procedure Laterality Date   APPENDECTOMY     CHOLECYSTECTOMY N/A 10/29/2014   Procedure: LAPAROSCOPIC CHOLECYSTECTOMY WITH INTRAOPERATIVE CHOLANGIOGRAM;  Surgeon: Jackolyn Confer, MD;  Location: [place];  Service: General;  Laterality: N/A;   COLONOSCOPY     CORONARY ARTERY BYPASS GRAFT N/A 08/18/2017   Procedure: CORONARY ARTERY BYPASS GRAFTING (CABG) x 3, USING LEFT INTERNAL MAMMARY ARTERY AND RIGHT GREATER SAPHENOUS VEIN HARVESTED ENDOSCOPICALLY;  Surgeon: Melrose Nakayama, MD;  Location: [place];  Service: Open Heart Surgery;  Laterality: N/A;   EYE SURGERY Bilateral    catarct with lens replacement   HERNIA REPAIR     RIH   LEFT HEART CATH AND CORONARY ANGIOGRAPHY N/A 08/18/2017   Procedure: LEFT HEART CATH AND CORONARY ANGIOGRAPHY;  Surgeon: [name], MD;  Location: [place] CV LAB;  Service: Cardiovascular;  Laterality: N/A;   lipoma removed  1993   RLQ area   MELANOMA EXCISION  TEE WITHOUT CARDIOVERSION N/A 08/18/2017   Procedure: TRANSESOPHAGEAL ECHOCARDIOGRAM (TEE);  Surgeon: Melrose Nakayama, MD;  Location: [place];  Service: Open Heart Surgery;  Laterality: N/A;   TUBAL LIGATION       Medications Prior to Admission: Prior to Admission medications   Medication Sig Start Date End Date Taking? Authorizing Provider  albuterol (PROAIR HFA) 108 (90 Base) MCG/ACT inhaler Inhale 2 puffs into the lungs every 6 (six) hours as needed for wheezing or shortness of breath. 02/04/20   Parrett, Fonnie Mu, NP  aspirin 81 MG tablet Take 81 mg by mouth  at bedtime.    [provider]  calcium carbonate (TUMS - DOSED IN MG ELEMENTAL CALCIUM) 500 MG chewable tablet Chew 1 tablet by mouth daily as needed for indigestion or heartburn.    [provider]  Cholecalciferol (VITAMIN D-3) 1000 UNITS CAPS Take 1,000 Units by mouth daily.     [provider]  Dextrose-Fructose-Sod Citrate [phone] MG CHEW Chew 2 tablets by mouth as needed (nausea).     [provider]  Elderberry 575 MG/5ML SYRP Take 15 mLs by mouth daily.    [provider]  Fluticasone-Umeclidin-Vilant (TRELEGY ELLIPTA) 100-62.5-25 MCG/INH AEPB Inhale 1 puff into the lungs daily. 07/29/20   Lauraine Rinne, NP  Magnesium 250 MG TABS Take 250 mg by mouth 2 (two) times daily.    [provider]  OIL OF OREGANO PO Take 1 capsule by mouth daily.     [provider]  OVER THE COUNTER MEDICATION Place 6 drops under the tongue 2 (two) times daily. CBD oil    [provider]  TURMERIC PO Take 1 capsule by mouth daily.     [provider]  VITAMIN E PO Take by mouth.    [provider]     Allergies:    Allergies  Allergen Reactions   Codeine Nausea And Vomiting   Statins Other (See Comments)    Patient refuses> practices holistic lifestyle    Social History:   Social History   Socioeconomic History   Marital status: Married    Spouse name: Not on file   Number of children: Not on file   Years of education: Not on file   Highest education level: Not on file  Occupational History   Not on file  Tobacco Use   Smoking status: Former    Packs/day: 1.00    Years: 50.00    Pack years: 50.00    Types: Cigarettes    Quit date: 05/22/2014    Years since quitting: 7.2   Smokeless tobacco: Never   Tobacco comments:    quit 05/2014  Vaping Use   Vaping Use: Never used  Substance and Sexual Activity   Alcohol use: Yes    Alcohol/week: 0.0 standard drinks    Comment: rarely   Drug use: No    Sexual activity: Not on file  Other Topics Concern   Not on file  Social History Narrative   Not on file   Social Determinants of Health   Financial Resource Strain: Not on file  Food Insecurity: Not on file  Transportation Needs: Not on file  Physical Activity: Not on file  Stress: Not on file  Social Connections: Not on file  Intimate Partner Violence: Not on file    Family History:   The patient's family history includes Cancer in her maternal grandfather; Heart disease in her father and mother. There is no history of Colon  polyps, Colon cancer, Esophageal cancer, Stomach cancer, or Rectal cancer.    ROS:  Please see the history of present illness.  All other ROS reviewed and negative.     Physical Exam/Data:  There were no vitals filed for this visit. No intake or output data in the 24 hours ending 08/04/21 1755 Last 3 Weights 03/18/2021 09/08/2020 02/04/2020  Weight (lbs) 122 lb 12.8 oz 124 lb 12.8 oz 125 lb  Weight (kg) 55.702 kg 56.609 kg 56.7 kg     There is no height or weight on file to calculate BMI.  General:  Well nourished, well developed, in no acute distress; currently pain free HEENT: normal Neck: no JVD Vascular: No carotid bruits; Distal pulses 2+ bilaterally   Cardiac:  normal S1, S2; RRR; 1/6 SEM Lungs:  clear to auscultation bilaterally, no wheezing, rhonchi or rales  Abd: soft, nontender, no hepatomegaly  Ext: no edema Musculoskeletal:  No deformities, BUE and BLE strength normal and equal Skin: warm and dry  Neuro:  CNs 2-12 intact, no focal abnormalities noted Psych:  Normal affect    EKG:  The ECG that was done 07/30/2021 was personally reviewed and demonstrates SR 72 bpm, RBBB with nonspecific TW changes.  Relevant CV Studies:  Cath: 08/2017  IMPRESSION: Ms. [place] has 99% calcified ostial LAD disease in addition to moderate circumflex and RCA disease with normal LV function. She has crescendo angina. She will require coronary artery bypass  grafting. The sheath was removed and a TR band was placed on the right wrist to achieve patent hemostasis. The patient left the lab in stable condition. She'll be contacted by T CTS on Monday and will be seen next week for evaluation and scheduling her revascularization procedure. She left the lab in stable condition.   Quay Burow. MD, [facility] 08/18/2017 4:29 PM  Diagnostic Dominance: Right   Laboratory Data:  High Sensitivity Troponin:  No results for input(s): TROPONINIHS in the last 720 hours.    ChemistryNo results for input(s): NA, K, CL, CO2, GLUCOSE, BUN, CREATININE, CALCIUM, MG, GFRNONAA, GFRAA, ANIONGAP in the last 168 hours.  No results for input(s): PROT, ALBUMIN, AST, ALT, ALKPHOS, BILITOT in the last 168 hours. Lipids No results for input(s): CHOL, TRIG, HDL, LABVLDL, LDLCALC, CHOLHDL in the last 168 hours. HematologyNo results for input(s): WBC, RBC, HGB, HCT, MCV, MCH, MCHC, RDW, PLT in the last 168 hours. Thyroid No results for input(s): TSH, FREET4 in the last 168 hours. BNPNo results for input(s): BNP, PROBNP in the last 168 hours.  DDimer No results for input(s): DDIMER in the last 168 hours.   Radiology/Studies:  No results found.   Assessment and Plan:   Sarah Pena is a 81 y.o. female with CAD s/p 3v CABG, HTN, HLD, prior tobacco use, COPD, asthma who is being seen 08/04/2021 for the evaluation of chest pain.  NSTEMI: Currently, patient is pain-free; her chest pain resolved following presentation to [facility].  She has been on IV heparin therapy.  I discussed the cardiac catheterization procedure.  Presently she is stable and discussed rather then performing this procedure late tonight the plan will be to to do the cardiac catheterization with grafts and potential need for intervention first case tomorrow morning.  Both the patient and the husband are agreeable.  The patient is aware of the risk benefits of the procedure.  CAD s/p 3v CABG: CABG was  performed by Dr. Roxan Hockey with a LIMA to LAD, SVG to OM1, and SVG to  PDA with scopic harvest of greater saphenous vein from her right leg.  HLD: Target LDL less than 70; if unable to take statin, consider PCSK9 inhibition  COPD: Remote tobacco history, has been on bronchodilator therapy.   Risk Assessment/Risk Scores:    HEAR Score (for undifferentiated chest pain):  HEAR Score: 5     Severity of Illness: The appropriate patient status for this patient is INPATIENT. Inpatient status is judged to be reasonable and necessary in order to provide the required intensity of service to ensure the patient's safety. The patient's presenting symptoms, physical exam findings, and initial radiographic and laboratory data in the context of their chronic comorbidities is felt to place them at high risk for further clinical deterioration. Furthermore, it is not anticipated that the patient will be medically stable for discharge from the hospital within 2 midnights of admission.   * I certify that at the point of admission it is my clinical judgment that the patient will require inpatient hospital care spanning beyond 2 midnights from the point of admission due to high intensity of service, high risk for further deterioration and high frequency of surveillance required.*   For questions or updates, please contact [place] Please consult [email or web address] for contact info under      Signed, Shelva Majestic, MD  08/04/2021 5:55 PM

## 2021-08-04 NOTE — Progress Notes (Signed)
[place] for heparin Indication: chest pain/ACS  Heparin Dosing Weight: ~55kg (RN to enter updated weight in chart tonight)  Labs: No results for input(s): HGB, HCT, PLT, APTT, LABPROT, INR, HEPARINUNFRC, HEPRLOWMOCWT, CREATININE, CKTOTAL, CKMB, TROPONINIHS in the last 72 hours.  CrCl cannot be calculated (Patient's most recent lab result is older than the maximum 21 days allowed.).  Assessment: 33 yof presenting 1/19 to [facility] with NSTEMI and started on heparin infusion and transferred to [facility] with plans for cath on 1/25. Pharmacy consulted to dose heparin inpatient. Heparin drip currently running at 700 units/hr per RN from transfer with no reported issues with bleeding or the infusion. Patient is not on anticoagulation PTA. CBC pending.  Goal of Therapy:  Heparin level 0.3-0.7 units/ml Monitor platelets by anticoagulation protocol: Yes   Plan:  Continue heparin at current rate 700 units/hr for now Check 8hr heparin level Monitor daily CBC, s/sx bleeding Cath planned 1/25   Arturo Morton, PharmD, BCPS Please check AMION for all [place] contact numbers Clinical Pharmacist 08/04/2021 8:42 PM

## 2021-08-04 NOTE — Progress Notes (Signed)
Dr Claiborne Billings in to talk to patient about rescheduling cath procedure for tomorrow, 1/25.  Consent was signed and admission orders written. Pt transferred to [place] via stretcher.

## 2021-08-04 NOTE — Progress Notes (Signed)
Received patient from [facility] via [employer].  Pt alert and orient X4, skin warm and dry, resp even and unlabored, no complaints of CP or discomfort at this time.  IVF started with NS and Heparin piggback to NS in left hand.  Pt placed on bedside monitor,

## 2021-08-04 NOTE — H&P (Incomplete)
Cardiology Admission History and Physical:   Patient ID: Sarah Pena MRN: [id number]; DOB: 1940-07-27   Admission date: (Not on file)  PCP:  Deland Pretty, MD   [facility] Providers Cardiologist:  Quay Burow, MD   {  Chief Complaint:  Chest pain   Patient Profile:   Sarah Pena is a 81 y.o. female with CAD s/p 3v CABG, HTN, HLD, tobacco use, COPD, asthma who is being seen 08/04/2021 for the evaluation of chest pain.  History of Present Illness:   Sarah Pena is a 81 year old female with past medical history noted above.  She was seen back in 2019 and underwent outpatient cardiac catheterization which showed significant three-vessel disease.  Ultimately underwent emergent three-vessel CABG (LIMA to LAD, SVG to OM and SVG to PDA).  Postoperative course was uneventful.  She was discharged home on aspirin, beta-blocker, calcium channel blocker and ACE inhibitors.  She did follow-up in the office several weeks later and reported she had not been taking all of her blood pressure medications.  She was instructed to do so and to participate in cardiac rehab.  She was seen through a follow-up telehealth visit on 12/2018 with Dr. Gwenlyn Found.  Stated she had stopped smoking at that point. She declined continuing on statin or Repatha.   Presented to the ED at [facility] on 1/19 with complaints of severe chest pain which was relieved with aspirin and sublingual nitroglycerin at home.  Reports she has been having intermittent chest pain over the past couple of days with radiation into her arm as well as numbness which had been cyclic for the past several days.  She was ultimately brought to the ED by her husband.  Labs in the ED showed sodium 136, potassium 4, creatinine 1.9, troponin I 0.07>> 0.09, WBC 10, hemoglobin 13.9. CXR negative. EKG showed SR 72 bpm, RBBB with nonspecific TW changes. She was seen by cardiology, Dr. Bettina Gavia and started on IV heparin. Recommendations for transfer to [facility] for cardiac  cath.    Past Medical History:  Diagnosis Date   Asthma 05/2015   COPD (chronic obstructive pulmonary disease) (HCC)    Family history of heart disease    History of hiatal hernia    'small"   History of kidney stones    Melanoma ([place])    Melanoma of chest wall   Myocardial infarction ([place])    "mild"  years ago   Shortness of breath dyspnea    just recently- sob walking up stairs- "I think its pollen.    Past Surgical History:  Procedure Laterality Date   APPENDECTOMY     CHOLECYSTECTOMY N/A 10/29/2014   Procedure: LAPAROSCOPIC CHOLECYSTECTOMY WITH INTRAOPERATIVE CHOLANGIOGRAM;  Surgeon: Jackolyn Confer, MD;  Location: [place];  Service: General;  Laterality: N/A;   COLONOSCOPY     CORONARY ARTERY BYPASS GRAFT N/A 08/18/2017   Procedure: CORONARY ARTERY BYPASS GRAFTING (CABG) x 3, USING LEFT INTERNAL MAMMARY ARTERY AND RIGHT GREATER SAPHENOUS VEIN HARVESTED ENDOSCOPICALLY;  Surgeon: Melrose Nakayama, MD;  Location: [place];  Service: Open Heart Surgery;  Laterality: N/A;   EYE SURGERY Bilateral    catarct with lens replacement   HERNIA REPAIR     RIH   LEFT HEART CATH AND CORONARY ANGIOGRAPHY N/A 08/18/2017   Procedure: LEFT HEART CATH AND CORONARY ANGIOGRAPHY;  Surgeon: [name], MD;  Location: [place] CV LAB;  Service: Cardiovascular;  Laterality: N/A;   lipoma removed  1993   RLQ area   MELANOMA  EXCISION     TEE WITHOUT CARDIOVERSION N/A 08/18/2017   Procedure: TRANSESOPHAGEAL ECHOCARDIOGRAM (TEE);  Surgeon: Melrose Nakayama, MD;  Location: [place];  Service: Open Heart Surgery;  Laterality: N/A;   TUBAL LIGATION       Medications Prior to Admission: Prior to Admission medications   Medication Sig Start Date End Date Taking? Authorizing Provider  albuterol (PROAIR HFA) 108 (90 Base) MCG/ACT inhaler Inhale 2 puffs into the lungs every 6 (six) hours as needed for wheezing or shortness of breath. 02/04/20   Parrett, Fonnie Mu, NP  aspirin 81 MG tablet Take 81 mg by  mouth at bedtime.    [provider]  calcium carbonate (TUMS - DOSED IN MG ELEMENTAL CALCIUM) 500 MG chewable tablet Chew 1 tablet by mouth daily as needed for indigestion or heartburn.    [provider]  Cholecalciferol (VITAMIN D-3) 1000 UNITS CAPS Take 1,000 Units by mouth daily.     [provider]  Dextrose-Fructose-Sod Citrate [phone] MG CHEW Chew 2 tablets by mouth as needed (nausea).     [provider]  Elderberry 575 MG/5ML SYRP Take 15 mLs by mouth daily.    [provider]  Fluticasone-Umeclidin-Vilant (TRELEGY ELLIPTA) 100-62.5-25 MCG/INH AEPB Inhale 1 puff into the lungs daily. 07/29/20   Lauraine Rinne, NP  Magnesium 250 MG TABS Take 250 mg by mouth 2 (two) times daily.    [provider]  OIL OF OREGANO PO Take 1 capsule by mouth daily.     [provider]  OVER THE COUNTER MEDICATION Place 6 drops under the tongue 2 (two) times daily. CBD oil    [provider]  TURMERIC PO Take 1 capsule by mouth daily.     [provider]  VITAMIN E PO Take by mouth.    [provider]     Allergies:    Allergies  Allergen Reactions   Codeine Nausea And Vomiting   Statins Other (See Comments)    Patient refuses> practices holistic lifestyle    Social History:   Social History   Socioeconomic History   Marital status: Married    Spouse name: Not on file   Number of children: Not on file   Years of education: Not on file   Highest education level: Not on file  Occupational History   Not on file  Tobacco Use   Smoking status: Former    Packs/day: 1.00    Years: 50.00    Pack years: 50.00    Types: Cigarettes    Quit date: 05/22/2014    Years since quitting: 7.2   Smokeless tobacco: Never   Tobacco comments:    quit 05/2014  Vaping Use   Vaping Use: Never used  Substance and Sexual Activity   Alcohol use: Yes    Alcohol/week: 0.0 standard drinks    Comment: rarely   Drug use:  No   Sexual activity: Not on file  Other Topics Concern   Not on file  Social History Narrative   Not on file   Social Determinants of Health   Financial Resource Strain: Not on file  Food Insecurity: Not on file  Transportation Needs: Not on file  Physical Activity: Not on file  Stress: Not on file  Social Connections: Not on file  Intimate Partner Violence: Not on file    Family History:  *** The patient's family history includes Cancer in her maternal grandfather; Heart disease in her father and mother. There  is no history of Colon polyps, Colon cancer, Esophageal cancer, Stomach cancer, or Rectal cancer.    ROS:  Please see the history of present illness.  ***All other ROS reviewed and negative.     Physical Exam/Data:  There were no vitals filed for this visit. No intake or output data in the 24 hours ending 08/04/21 1326 Last 3 Weights 03/18/2021 09/08/2020 02/04/2020  Weight (lbs) 122 lb 12.8 oz 124 lb 12.8 oz 125 lb  Weight (kg) 55.702 kg 56.609 kg 56.7 kg     There is no height or weight on file to calculate BMI.  General:  Well nourished, well developed, in no acute distress*** HEENT: normal Neck: no*** JVD Vascular: No carotid bruits; Distal pulses 2+ bilaterally   Cardiac:  normal S1, S2; RRR; no murmur *** Lungs:  clear to auscultation bilaterally, no wheezing, rhonchi or rales  Abd: soft, nontender, no hepatomegaly  Ext: no*** edema Musculoskeletal:  No deformities, BUE and BLE strength normal and equal Skin: warm and dry  Neuro:  CNs 2-12 intact, no focal abnormalities noted Psych:  Normal affect    EKG:  The ECG that was done 07/30/2021 was personally reviewed and demonstrates SR 72 bpm, RBBB with nonspecific TW changes.  Relevant CV Studies:  Cath: 08/2017  IMPRESSION: Sarah Pena has 99% calcified ostial LAD disease in addition to moderate circumflex and RCA disease with normal LV function. She has crescendo angina. She will require coronary artery bypass  grafting. The sheath was removed and a TR band was placed on the right wrist to achieve patent hemostasis. The patient left the lab in stable condition. She'll be contacted by T CTS on Monday and will be seen next week for evaluation and scheduling her revascularization procedure. She left the lab in stable condition.   Quay Burow. MD, [facility] 08/18/2017 4:29 PM  Diagnostic Dominance: Right   Laboratory Data:  High Sensitivity Troponin:  No results for input(s): TROPONINIHS in the last 720 hours.    ChemistryNo results for input(s): NA, K, CL, CO2, GLUCOSE, BUN, CREATININE, CALCIUM, MG, GFRNONAA, GFRAA, ANIONGAP in the last 168 hours.  No results for input(s): PROT, ALBUMIN, AST, ALT, ALKPHOS, BILITOT in the last 168 hours. Lipids No results for input(s): CHOL, TRIG, HDL, LABVLDL, LDLCALC, CHOLHDL in the last 168 hours. HematologyNo results for input(s): WBC, RBC, HGB, HCT, MCV, MCH, MCHC, RDW, PLT in the last 168 hours. Thyroid No results for input(s): TSH, FREET4 in the last 168 hours. BNPNo results for input(s): BNP, PROBNP in the last 168 hours.  DDimer No results for input(s): DDIMER in the last 168 hours.   Radiology/Studies:  No results found.   Assessment and Plan:   Sarah Pena is a 81 y.o. female with CAD s/p 3v CABG, HTN, HLD, tobacco use, COPD, asthma who is being seen 08/04/2021 for the evaluation of chest pain.  NSTEMI:   CAD s/p 3v CABG:   HTN:   HLD:    Risk Assessment/Risk Scores:  {Complete the following score calculators/questions to meet required metrics.  Press F2:1}  {Is the patient being seen for CHEST PAIN, UNSTABLE ANGINA, NSTEMI or STEMI?     :[id number]} {Does this patient have CHF or CHF symptoms?      :[id number]} {Does this patient have ATRIAL FIBRILLATION?[phone]}   Severity of Illness: {Observation/Inpatient:21159}   For questions or updates, please contact [place] Please consult [email or web address] for contact info under      Signed, Reino Bellis, NP  08/04/2021 1:26  PM

## 2021-08-05 ENCOUNTER — Encounter (HOSPITAL_COMMUNITY)
Admission: AD | Disposition: A | Discharge status: Home / Self Care | Payer: Self-pay | Source: Other Acute Inpatient Hospital | Attending: Cardiovascular Disease

## 2021-08-05 HISTORY — PX: LEFT HEART CATH AND CORS/GRAFTS ANGIOGRAPHY: CATH118250

## 2021-08-05 HISTORY — PX: CORONARY BALLOON ANGIOPLASTY: CATH118233

## 2021-08-05 LAB — COMPREHENSIVE METABOLIC PANEL
ALT: 51 U/L — ABNORMAL HIGH (ref 0–44)
AST: 43 U/L — ABNORMAL HIGH (ref 15–41)
Albumin: 3.2 g/dL — ABNORMAL LOW (ref 3.5–5.0)
Alkaline Phosphatase: 57 U/L (ref 38–126)
Anion gap: 7 (ref 5–15)
BUN: 12 mg/dL (ref 8–23)
CO2: 20 mmol/L — ABNORMAL LOW (ref 22–32)
Calcium: 9.2 mg/dL (ref 8.9–10.3)
Chloride: 109 mmol/L (ref 98–111)
Creatinine, Ser: 0.72 mg/dL (ref 0.44–1.00)
GFR, Estimated: >60 mL/min (ref >60)
Glucose, Bld: 85 mg/dL (ref 70–99)
Potassium: 3.8 mmol/L (ref 3.5–5.1)
Sodium: 136 mmol/L (ref 135–145)
Total Bilirubin: 0.6 mg/dL (ref 0.3–1.2)
Total Protein: 6 g/dL — ABNORMAL LOW (ref 6.5–8.1)

## 2021-08-05 LAB — POCT ACTIVATED CLOTTING TIME
Activated Clotting Time: 251 seconds
Activated Clotting Time: 341 seconds
Activated Clotting Time: 462 seconds
Activated Clotting Time: 227 seconds
Activated Clotting Time: 197 seconds
Activated Clotting Time: 185 seconds

## 2021-08-05 LAB — CBC
HCT: 42.4 % (ref 36.0–46.0)
Hemoglobin: 14 g/dL (ref 12.0–15.0)
MCH: 31 pg (ref 26.0–34.0)
MCHC: 33 g/dL (ref 30.0–36.0)
MCV: 93.8 fL (ref 80.0–100.0)
Platelets: 215 10*3/uL (ref 150–400)
RBC: 4.52 MIL/uL (ref 3.87–5.11)
RDW: 14 % (ref 11.5–15.5)
WBC: 8.1 10*3/uL (ref 4.0–10.5)
nRBC: 0 % (ref 0.0–0.2)

## 2021-08-05 LAB — HEPARIN LEVEL (UNFRACTIONATED): Heparin Unfractionated: 0.27 IU/mL — ABNORMAL LOW (ref 0.30–0.70)

## 2021-08-05 SURGERY — LEFT HEART CATH AND CORS/GRAFTS ANGIOGRAPHY
Anesthesia: LOCAL

## 2021-08-05 MED ORDER — DIAZEPAM 2 MG PO TABS: 2.0000 mg | ORAL_TABLET | Freq: Four times a day (QID) | ORAL | Status: DC | PRN

## 2021-08-05 MED ORDER — MIDAZOLAM HCL 2 MG/2ML IJ SOLN
INTRAMUSCULAR | Status: AC
  Filled 2021-08-05: qty 2

## 2021-08-05 MED ORDER — HEPARIN (PORCINE) IN NACL 1000-0.9 UT/500ML-% IV SOLN
INTRAVENOUS | Status: AC
  Filled 2021-08-05: qty 1000

## 2021-08-05 MED ORDER — HEPARIN SODIUM (PORCINE) 1000 UNIT/ML IJ SOLN
INTRAMUSCULAR | Status: AC
  Filled 2021-08-05: qty 10

## 2021-08-05 MED ORDER — ONDANSETRON HCL 4 MG/2ML IJ SOLN: 4.0000 mg | Freq: Four times a day (QID) | INTRAMUSCULAR | Status: DC | PRN

## 2021-08-05 MED ORDER — SODIUM CHLORIDE 0.9 % IV SOLN: INTRAVENOUS | Status: DC

## 2021-08-05 MED ORDER — ASPIRIN 81 MG PO CHEW
81.0000 mg | CHEWABLE_TABLET | ORAL | Status: AC
  Administered 2021-08-05: 07:00:00 81 mg via ORAL
  Filled 2021-08-05: qty 1

## 2021-08-05 MED ORDER — NITROGLYCERIN 1 MG/10 ML FOR IR/CATH LAB
INTRA_ARTERIAL | Status: DC | PRN
  Administered 2021-08-05 (×3): 200 ug via INTRACORONARY

## 2021-08-05 MED ORDER — NITROGLYCERIN 1 MG/10 ML FOR IR/CATH LAB
INTRA_ARTERIAL | Status: AC
  Filled 2021-08-05: qty 10

## 2021-08-05 MED ORDER — VERAPAMIL HCL 2.5 MG/ML IV SOLN
INTRAVENOUS | Status: AC
  Filled 2021-08-05: qty 2

## 2021-08-05 MED ORDER — SODIUM CHLORIDE 0.9% FLUSH
3.0000 mL | Freq: Two times a day (BID) | INTRAVENOUS | Status: DC
  Administered 2021-08-05: 23:00:00 3 mL via INTRAVENOUS

## 2021-08-05 MED ORDER — FAMOTIDINE IN NACL 20-0.9 MG/50ML-% IV SOLN
20.0000 mg | Freq: Once | INTRAVENOUS | Status: AC
  Administered 2021-08-05: 14:00:00 20 mg via INTRAVENOUS
  Filled 2021-08-05: qty 50

## 2021-08-05 MED ORDER — MIDAZOLAM HCL 2 MG/2ML IJ SOLN
INTRAMUSCULAR | Status: DC | PRN
  Administered 2021-08-05 (×2): 1 mg via INTRAVENOUS

## 2021-08-05 MED ORDER — SODIUM CHLORIDE 0.9% FLUSH: 3.0000 mL | INTRAVENOUS | Status: DC | PRN

## 2021-08-05 MED ORDER — HEPARIN SODIUM (PORCINE) 1000 UNIT/ML IJ SOLN
INTRAMUSCULAR | Status: DC | PRN
  Administered 2021-08-05: 2000 [IU] via INTRAVENOUS
  Administered 2021-08-05: 5000 [IU] via INTRAVENOUS

## 2021-08-05 MED ORDER — ASPIRIN 81 MG PO CHEW
81.0000 mg | CHEWABLE_TABLET | Freq: Every day | ORAL | Status: DC
  Administered 2021-08-06: 81 mg via ORAL
  Filled 2021-08-05: qty 1

## 2021-08-05 MED ORDER — HEPARIN (PORCINE) IN NACL 1000-0.9 UT/500ML-% IV SOLN
INTRAVENOUS | Status: DC | PRN
  Administered 2021-08-05 (×2): 500 mL

## 2021-08-05 MED ORDER — SODIUM CHLORIDE 0.9 % IV SOLN: 250.0000 mL | INTRAVENOUS | Status: DC | PRN

## 2021-08-05 MED ORDER — LABETALOL HCL 5 MG/ML IV SOLN
10.0000 mg | INTRAVENOUS | Status: AC | PRN
  Administered 2021-08-05: 14:00:00 10 mg via INTRAVENOUS

## 2021-08-05 MED ORDER — TICAGRELOR 90 MG PO TABS
ORAL_TABLET | ORAL | Status: DC | PRN
  Administered 2021-08-05: 180 mg via ORAL

## 2021-08-05 MED ORDER — ACETAMINOPHEN 325 MG PO TABS: 650.0000 mg | ORAL_TABLET | ORAL | Status: DC | PRN

## 2021-08-05 MED ORDER — MAGNESIUM GLUCONATE 500 MG PO TABS
250.0000 mg | ORAL_TABLET | Freq: Two times a day (BID) | ORAL | Status: DC
  Administered 2021-08-05 – 2021-08-06 (×2): 250 mg via ORAL
  Filled 2021-08-05 (×3): qty 1

## 2021-08-05 MED ORDER — AMLODIPINE BESYLATE 2.5 MG PO TABS
2.5000 mg | ORAL_TABLET | Freq: Every day | ORAL | Status: DC
  Administered 2021-08-05 – 2021-08-06 (×2): 2.5 mg via ORAL
  Filled 2021-08-05 (×2): qty 1

## 2021-08-05 MED ORDER — LIDOCAINE HCL (PF) 1 % IJ SOLN
INTRAMUSCULAR | Status: AC
  Filled 2021-08-05: qty 30

## 2021-08-05 MED ORDER — IOHEXOL 350 MG/ML SOLN
INTRAVENOUS | Status: DC | PRN
  Administered 2021-08-05: 11:00:00 150 mL

## 2021-08-05 MED ORDER — LABETALOL HCL 5 MG/ML IV SOLN
INTRAVENOUS | Status: AC
  Filled 2021-08-05: qty 4

## 2021-08-05 MED ORDER — TICAGRELOR 90 MG PO TABS
90.0000 mg | ORAL_TABLET | Freq: Two times a day (BID) | ORAL | Status: DC
  Administered 2021-08-05 – 2021-08-06 (×2): 90 mg via ORAL
  Filled 2021-08-05 (×2): qty 1

## 2021-08-05 MED ORDER — LIDOCAINE HCL (PF) 1 % IJ SOLN
INTRAMUSCULAR | Status: DC | PRN
  Administered 2021-08-05: 10 mL

## 2021-08-05 MED ORDER — FAMOTIDINE IN NACL 20-0.9 MG/50ML-% IV SOLN
INTRAVENOUS | Status: AC
  Filled 2021-08-05: qty 50

## 2021-08-05 MED ORDER — METOPROLOL TARTRATE 12.5 MG HALF TABLET
12.5000 mg | ORAL_TABLET | Freq: Two times a day (BID) | ORAL | Status: DC
  Filled 2021-08-05: qty 1

## 2021-08-05 MED ORDER — TICAGRELOR 90 MG PO TABS
ORAL_TABLET | ORAL | Status: AC
  Filled 2021-08-05: qty 2

## 2021-08-05 MED ORDER — HYDRALAZINE HCL 20 MG/ML IJ SOLN: 10.0000 mg | INTRAMUSCULAR | Status: AC | PRN

## 2021-08-05 SURGICAL SUPPLY — 21 items
BALLN SAPPHIRE 1.5X12 (BALLOONS) ×4
BALLN SAPPHIRE 2.0X12 (BALLOONS) ×2
BALLOON SAPPHIRE 1.5X12 (BALLOONS) IMPLANT
BALLOON SAPPHIRE 2.0X12 (BALLOONS) IMPLANT
CATH INFINITI 5 FR RCB (CATHETERS) ×1 IMPLANT
CATH INFINITI 5FR MULTPACK ANG (CATHETERS) ×1 IMPLANT
CATH TELESCOPE 6F GEC (CATHETERS) ×1 IMPLANT
CATH VISTA GUIDE 6FR JR4 (CATHETERS) ×1 IMPLANT
CATH VISTA GUIDE 6FR XBRCA (CATHETERS) ×1 IMPLANT
ELECT DEFIB PAD ADLT CADENCE (PAD) ×1 IMPLANT
KIT ENCORE 26 ADVANTAGE (KITS) ×1 IMPLANT
KIT HEART LEFT (KITS) ×3 IMPLANT
PACK CARDIAC CATHETERIZATION (CUSTOM PROCEDURE TRAY) ×3 IMPLANT
SHEATH PINNACLE 5F 10CM (SHEATH) ×1 IMPLANT
SHEATH PINNACLE 6F 10CM (SHEATH) ×1 IMPLANT
SHEATH PROBE COVER 6X72 (BAG) ×1 IMPLANT
TRANSDUCER W/STOPCOCK (MISCELLANEOUS) ×3 IMPLANT
TUBING CIL FLEX 10 FLL-RA (TUBING) ×3 IMPLANT
WIRE COUGAR XT STRL 190CM (WIRE) ×1 IMPLANT
WIRE EMERALD 3MM-J .035X150CM (WIRE) ×1 IMPLANT
WIRE HI TORQ WHISPER MS 190CM (WIRE) ×1 IMPLANT

## 2021-08-05 NOTE — Progress Notes (Signed)
Pt is refusing some medications this shift. Educated pt on the purpose of taking medications ordered while pt verbalized understanding pt stated would like to discuss with MD who ordered medications first before taking. At this time pt is refusing to continue heparin drip as well as take PO Metoprolol ordered. Pharmacy is aware of pt refusal. Talked Dr. Hassell Done with cardiology about issue as well.

## 2021-08-05 NOTE — Plan of Care (Signed)
Poc initiated and progressing.  

## 2021-08-05 NOTE — Progress Notes (Signed)
[place] for heparin Indication: chest pain/ACS  Heparin Dosing Weight: ~55kg (RN to enter updated weight in chart tonight)  Labs: Recent Labs    08/05/21 0134  HGB 14.0  HCT 42.4  PLT 215  HEPARINUNFRC 0.27*  CREATININE 0.72    Estimated Creatinine Clearance: 42.3 mL/min (by C-G formula based on SCr of 0.72 mg/dL).  Assessment: 69 yof presenting 1/19 to [facility] with NSTEMI and started on heparin infusion and transferred to [facility] with plans for cath on 1/25. Pharmacy consulted to dose heparin inpatient.   Heparin infusion running at 700 units/hr. Heparin level is slightly subtherapuetic. RN reports no s/s of bleeding   Goal of Therapy:  Heparin level 0.3-0.7 units/ml Monitor platelets by anticoagulation protocol: Yes   Plan:  Increase heparin slightly to 750 units/hr \ Monitor daily CBC, s/sx bleeding Cath planned this AM. F/u after cath    Albertina Parr, PharmD., BCCCP Clinical Pharmacist Please refer to [facility] for unit-specific pharmacist

## 2021-08-05 NOTE — Progress Notes (Signed)
°  Transition of Care [facility]) Screening Note   Patient Details  Name: Sarah Pena Date of Birth: [birth year]   Transition of Care [facility]) CM/SW Contact:    Milas Gain, [place] Phone Number: 08/05/2021, 4:44 PM    Transition of Care Department [facility]) has reviewed patient and no TOC needs have been identified at this time. We will continue to monitor patient advancement through interdisciplinary progression rounds. If new patient transition needs arise, please place a TOC consult.

## 2021-08-05 NOTE — Progress Notes (Addendum)
Site area: Right groin a  6  french arterial sheath was removed  Site Prior to Removal:  Level 0  Pressure Applied For 20 MINUTES     Bedrest Beginning at 1445pm X 4 hours  Manual:   Yes.    Patient Status During Pull:  stable  Post Pull Groin Site:  Level 0  Post Pull Instructions Given:  Yes.    Post Pull Pulses Present:  Yes.    Dressing Applied:  Yes.    Comments:

## 2021-08-05 NOTE — Interval H&P Note (Signed)
Cath Lab Visit (complete for each Cath Lab visit)  Clinical Evaluation Leading to the Procedure:   ACS: No.  Non-ACS:    Anginal Classification: CCS III  Anti-ischemic medical therapy: Minimal Therapy (1 class of medications)  Non-Invasive Test Results: No non-invasive testing performed  Prior CABG: No previous CABG      History and Physical Interval Note:  08/05/2021 8:35 AM  Sarah Pena  has presented today for surgery, with the diagnosis of nstemi.  The various methods of treatment have been discussed with the patient and family. After consideration of risks, benefits and other options for treatment, the patient has consented to  Procedure(s): LEFT HEART CATH AND CORS/GRAFTS ANGIOGRAPHY (N/A) as a surgical intervention.  The patient's history has been reviewed, patient examined, no change in status, stable for surgery.  I have reviewed the patient's chart and labs.  Questions were answered to the patient's satisfaction.     Shelva Majestic

## 2021-08-05 NOTE — Progress Notes (Signed)
Pt arrived to the floor from PACU  via stretcher, alert and oriented weight obtained, cardiac monitoring initiated. Patient oriented to staff, room and equipment.

## 2021-08-06 ENCOUNTER — Encounter (HOSPITAL_COMMUNITY): Payer: Self-pay | Admitting: Cardiovascular Disease

## 2021-08-06 ENCOUNTER — Other Ambulatory Visit (HOSPITAL_COMMUNITY): Payer: Self-pay

## 2021-08-06 ENCOUNTER — Telehealth: Payer: Self-pay | Admitting: Cardiovascular Disease

## 2021-08-06 ENCOUNTER — Other Ambulatory Visit: Payer: Self-pay | Admitting: Cardiology

## 2021-08-06 DIAGNOSIS — Z9861 Coronary angioplasty status: Secondary | ICD-10-CM

## 2021-08-06 DIAGNOSIS — I25810 Atherosclerosis of coronary artery bypass graft(s) without angina pectoris: Secondary | ICD-10-CM

## 2021-08-06 HISTORY — DX: Coronary angioplasty status: Z98.61

## 2021-08-06 HISTORY — DX: Atherosclerosis of coronary artery bypass graft(s) without angina pectoris: I25.810

## 2021-08-06 LAB — BASIC METABOLIC PANEL
Anion gap: 7 (ref 5–15)
BUN: 11 mg/dL (ref 8–23)
CO2: 23 mmol/L (ref 22–32)
Calcium: 8.9 mg/dL (ref 8.9–10.3)
Chloride: 109 mmol/L (ref 98–111)
Creatinine, Ser: 0.74 mg/dL (ref 0.44–1.00)
GFR, Estimated: >60 mL/min (ref >60)
Glucose, Bld: 93 mg/dL (ref 70–99)
Potassium: 3.6 mmol/L (ref 3.5–5.1)
Sodium: 139 mmol/L (ref 135–145)

## 2021-08-06 LAB — LIPID PANEL
Cholesterol: 200 mg/dL (ref 0–200)
HDL: 52 mg/dL (ref >40)
LDL Cholesterol: 129 mg/dL — ABNORMAL HIGH (ref 0–99)
Total CHOL/HDL Ratio: 3.8 RATIO
Triglycerides: 93 mg/dL (ref <150)
VLDL: 19 mg/dL (ref 0–40)

## 2021-08-06 LAB — CBC
HCT: 37.8 % (ref 36.0–46.0)
Hemoglobin: 12.6 g/dL (ref 12.0–15.0)
MCH: 31.4 pg (ref 26.0–34.0)
MCHC: 33.3 g/dL (ref 30.0–36.0)
MCV: 94.3 fL (ref 80.0–100.0)
Platelets: 213 10*3/uL (ref 150–400)
RBC: 4.01 MIL/uL (ref 3.87–5.11)
RDW: 14 % (ref 11.5–15.5)
WBC: 7.9 10*3/uL (ref 4.0–10.5)
nRBC: 0 % (ref 0.0–0.2)

## 2021-08-06 MED ORDER — NITROGLYCERIN 0.4 MG SL SUBL: 0.4000 mg | SUBLINGUAL_TABLET | SUBLINGUAL | 4 refills | Status: AC | PRN

## 2021-08-06 MED ORDER — AMLODIPINE BESYLATE 2.5 MG PO TABS: 2.5000 mg | ORAL_TABLET | Freq: Every day | ORAL | 6 refills | Status: DC

## 2021-08-06 MED ORDER — EZETIMIBE 10 MG PO TABS: 10.0000 mg | ORAL_TABLET | Freq: Every day | ORAL | 6 refills | Status: DC

## 2021-08-06 MED ORDER — METOPROLOL TARTRATE 25 MG PO TABS: 12.5000 mg | ORAL_TABLET | Freq: Two times a day (BID) | ORAL | 6 refills | Status: DC

## 2021-08-06 MED ORDER — TICAGRELOR 90 MG PO TABS: 90.0000 mg | ORAL_TABLET | Freq: Two times a day (BID) | ORAL | 11 refills | Status: DC

## 2021-08-06 MED ORDER — EZETIMIBE 10 MG PO TABS
10.0000 mg | ORAL_TABLET | Freq: Every day | ORAL | Status: DC
  Administered 2021-08-06: 10 mg via ORAL
  Filled 2021-08-06: qty 1

## 2021-08-06 NOTE — Plan of Care (Signed)
  Problem: Activity: Goal: Ability to return to baseline activity level will improve Outcome: Progressing   Problem: Cardiovascular: Goal: Ability to achieve and maintain adequate cardiovascular perfusion will improve Outcome: Progressing Goal: Vascular access site(s) Level 0-1 will be maintained Outcome: Progressing   

## 2021-08-06 NOTE — Telephone Encounter (Signed)
° °  Per Cecilie Kicks, pt wants to switch from Dr. Gwenlyn Found to Dr. Agustin Cree, due to location, she is closer to [facility] office.

## 2021-08-06 NOTE — Plan of Care (Signed)

## 2021-08-06 NOTE — Progress Notes (Signed)
CARDIAC REHAB PHASE I   PRE:  Rate/Rhythm: 103 ST  BP:  Supine:   Sitting: 123/61  Standing:    SaO2:   MODE:  Ambulation: 460 ft   POST:  Rate/Rhythm: 108 ST  BP:  Supine:   Sitting: 142/75  Standing:    SaO2:  Tolerated ambulation well with ambulation independently without difficulty or angina.  Referred to phase II cardiac rehab in [place].  Education completed LE:XNTZ, angina symptoms, NTG usage, when to call 911 and when to call the doctor.  Discussed activity restriction and exercise progression.   Liliane Channel RN, BSN 08/06/2021 10:19 AM

## 2021-08-06 NOTE — Progress Notes (Signed)
Progress Note  Patient Name: Sarah Pena Date of Encounter: 08/06/2021  [facility] Cardiologist: Quay Burow, MD   Subjective   Asking questions about medications.  She is afraid of statins because her husband can barely walk on statins.  She is interested in zetia and attending lipid clinic.  No chest pain and wants to go home today.  She understands the Brilinta and difficulty opening the native RCA.   Inpatient Medications    Scheduled Meds:  amLODipine  2.5 mg Oral Daily   aspirin  81 mg Oral Daily   cholecalciferol  1,000 Units Oral Daily   fluticasone furoate-vilanterol  1 puff Inhalation Daily   magnesium gluconate  250 mg Oral BID   metoprolol tartrate  12.5 mg Oral BID   sodium chloride flush  3 mL Intravenous Q12H   sodium chloride flush  3 mL Intravenous Q12H   ticagrelor  90 mg Oral BID   umeclidinium bromide  1 puff Inhalation Daily   Continuous Infusions:  sodium chloride 100 mL (08/05/21 0930)   sodium chloride 75 mL/hr at 08/06/21 0000   sodium chloride     PRN Meds: sodium chloride, sodium chloride, acetaminophen, albuterol, ALPRAZolam, diazepam, nitroGLYCERIN, ondansetron (ZOFRAN) IV, sodium chloride flush, sodium chloride flush, zolpidem   Vital Signs    Vitals:   08/05/21 2353 08/06/21 0455 08/06/21 0630 08/06/21 0816  BP: 111/69 124/72  123/61  Pulse: 71 79  86  Resp: 17 17  16   Temp: 98 F (36.7 C) 97.7 F (36.5 C)  97.8 F (36.6 C)  TempSrc: Oral Oral  Oral  SpO2: 92% 95%    Weight:   52.7 kg     Intake/Output Summary (Last 24 hours) at 08/06/2021 6962 Last data filed at 08/05/2021 1416 Gross per 24 hour  Intake --  Output 500 ml  Net -500 ml   Last 3 Weights 08/06/2021 08/05/2021 08/04/2021  Weight (lbs) 116 lb 1.6 oz 116 lb 6.1 oz 116 lb 6.4 oz  Weight (kg) 52.663 kg 52.79 kg 52.799 kg      Telemetry    SR - Personally Reviewed  ECG    SR with RBBB and T wave inversions inf leads - Personally Reviewed  Physical Exam    GEN: No acute distress.   Neck: No JVD Cardiac: RRR, no murmurs, rubs, or gallops.  Respiratory: Clear to auscultation bilaterally though rare wheeze has not had inhaler yet.Marland Kitchen GI: Soft, nontender, non-distended  MS: No edema; No deformity. Neuro:  Nonfocal  Psych: Normal affect   Labs    High Sensitivity Troponin:  No results for input(s): TROPONINIHS in the last 720 hours.   Chemistry Recent Labs  Lab 08/05/21 0134 08/06/21 0553  NA 136 139  K 3.8 3.6  CL 109 109  CO2 20* 23  GLUCOSE 85 93  BUN 12 11  CREATININE 0.72 0.74  CALCIUM 9.2 8.9  PROT 6.0*  --   ALBUMIN 3.2*  --   AST 43*  --   ALT 51*  --   ALKPHOS 57  --   BILITOT 0.6  --   GFRNONAA >60 >60  ANIONGAP 7 7    Lipids No results for input(s): CHOL, TRIG, HDL, LABVLDL, LDLCALC, CHOLHDL in the last 168 hours.  Hematology Recent Labs  Lab 08/05/21 0134 08/06/21 0553  WBC 8.1 7.9  RBC 4.52 4.01  HGB 14.0 12.6  HCT 42.4 37.8  MCV 93.8 94.3  MCH 31.0 31.4  MCHC 33.0 33.3  RDW 14.0 14.0  PLT 215 213   Thyroid No results for input(s): TSH, FREET4 in the last 168 hours.  BNPNo results for input(s): BNP, PROBNP in the last 168 hours.  DDimer No results for input(s): DDIMER in the last 168 hours.   Radiology    CARDIAC CATHETERIZATION  Result Date: 08/05/2021   Mid LAD lesion is 100% stenosed.   Dist LM to Ost LAD lesion is 95% stenosed.   Ost Cx to Prox Cx lesion is 80% stenosed.   Dist RCA lesion is 50% stenosed.   Mid RCA lesion is 95% stenosed.   Prox RCA-1 lesion is 99% stenosed.   Prox RCA-2 lesion is 50% stenosed.   Prox RCA to Mid RCA lesion is 80% stenosed.   Origin lesion is 100% stenosed.   Balloon angioplasty was performed.   Post intervention, there is a 75% residual stenosis.   The left ventricular systolic function is normal. 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 The LAD has 95% calcified ostial stenosis and is totally occluded after first septal perforating artery  and small diagonal vessel. The circumflex vessel has diffuse proximal 80% stenosis. The native right coronary artery has significant coronary calcification with 99% eccentric calcified stenosis on the proximal bend in the vessel followed by 5080 and 95% mid stenoses with 50% distal stenosis. Patent Y graft with a LIMA portion to the LAD and SVG portion to the OM1 vessel. Total ostial occlusion of the vein graft which had supplied the PDA vessel. Very difficult PCI to the calcified RCA with ultimate PTCA of the 99% eccentric proximal stenosis to approximately 75%.  Due to the vessel calcification, angulation, and catheter backup difficulties, despite attempt using a telescope guideliner the wound catheters were never able to get beyond the proximal stenosis.  At the completion of the procedure, there was brisk TIMI-3 flow, the patient was chest pain-free and had stable hemodynamics. RECOMMENDATION: The patient has not been on any medical therapy for angina, BP or lipid management.  Apparently she has refused statins in the past. Will initiate low-dose beta-blocker therapy with metoprolol tartrate 12.5 mg twice a day and initiate amlodipine 2.5 mg.  Depending upon tolerability may be able to initiate isosorbide as an outpatient.  Would strongly recommend initiation of statin therapy or consideration of Zetia and PCSK9 inhibition.    Cardiac Studies   Cardiac cath 08/05/21   Mid LAD lesion is 100% stenosed.   Dist LM to Ost LAD lesion is 95% stenosed.   Ost Cx to Prox Cx lesion is 80% stenosed.   Dist RCA lesion is 50% stenosed.   Mid RCA lesion is 95% stenosed.   Prox RCA-1 lesion is 99% stenosed.   Prox RCA-2 lesion is 50% stenosed.   Prox RCA to Mid RCA lesion is 80% stenosed.   Origin lesion is 100% stenosed.   Balloon angioplasty was performed.   Post intervention, there is a 75% residual stenosis.   The left ventricular systolic function is normal.   Severe multivessel CAD with significant  coronary calcification.   The LAD has 95% calcified ostial stenosis and is totally occluded after first septal perforating artery and small diagonal vessel.   The circumflex vessel has diffuse proximal 80% stenosis.   The native right coronary artery has significant coronary calcification with 99% eccentric calcified stenosis on the proximal bend in the vessel followed by 5080 and 95% mid stenoses with 50% distal stenosis.   Patent Y graft with a LIMA portion  to the LAD and SVG portion to the OM1 vessel.   Total ostial occlusion of the vein graft which had supplied the PDA vessel.   Very difficult PCI to the calcified RCA with ultimate PTCA of the 99% eccentric proximal stenosis to approximately 75%.  Due to the vessel calcification, angulation, and catheter backup difficulties, despite attempt using a telescope guideliner the wound catheters were never able to get beyond the proximal stenosis.  At the completion of the procedure, there was brisk TIMI-3 flow, the patient was chest pain-free and had stable hemodynamics.   RECOMMENDATION: The patient has not been on any medical therapy for angina, BP or lipid management.  Apparently she has refused statins in the past. Will initiate low-dose beta-blocker therapy with metoprolol tartrate 12.5 mg twice a day and initiate amlodipine 2.5 mg.  Depending upon tolerability may be able to initiate isosorbide as an outpatient.  Would strongly recommend initiation of statin therapy or consideration of Zetia and PCSK9 inhibition.    Patient Profile     81 y.o. female  with CAD s/p 3v CABG, HTN, HLD, tobacco use stopped in 2019, COPD, asthma admitted at [facility] with chest pain elevated troponin I.  [facility].     Assessment & Plan    [place] - VG to PDA was 100% occluded with significant disease in RCA - PTCA of prox RCA native but with calcification only opened from 9% to 75%. Plan DAPT, begin BB, she refuses statins due to her  husbands issues and her sister's issues.  She is amenable to zetia and going to lipid clinic to review options for  PCSK9.  -she has seen Dr. Gwenlyn Found in the past but prefers another cardiologist, there was communication issues.   -will ask pharmacy to review meds with the pt, she did not take meds ordered post CABG due to not understanding why needed.  -discharge today after seen by cardiac rehab.  -EF 55% by cath wit suggestion of LVH,  LVEDP 10 mmHg.         - recommend outpt echo.                                                                          CAD with hx CABG X 3  LIMA to LAD, SVG to OM1, and SVG to PDA with scopic harvest of greater saphenous vein from her right leg. In 2019. Now with VG to PDA 100% stenosis.  LIMA to LAD and VG to OM1 patent.   HLD refuses statin.  Agreeable to zetia and lipid clinic for PCSK9 options.   COPD stopped tobacco in 2019 uses inhalers.     For questions or updates, please contact [place] Please consult [email or web address] for contact info under        Signed, Cecilie Kicks, NP  08/06/2021, 9:22 AM

## 2021-08-06 NOTE — TOC Benefit Eligibility Note (Signed)
Patient Teacher, English as a foreign language completed.    The patient is currently admitted and upon discharge could be taking Brilinta 90 mg.  The current 30 day co-pay is, $40.00.   The patient is insured through [place], [place] Patient Advocate Specialist [place] Patient Advocate Team Direct Number: [phone]  Fax: [phone]

## 2021-08-06 NOTE — Discharge Summary (Signed)
Discharge Summary    Patient ID: Sarah Pena MRN: [id number]; DOB: 07-15-1940  Admit date: 08/04/2021 Discharge date: 08/06/2021  PCP:  Deland Pretty, MD   [facility] Providers Cardiologist:  Quay Burow, MD        Discharge Diagnoses    Principal Problem:   Unstable angina [facility]) Active Problems:   Hx of CABG X 3  2019   Pure hypercholesterolemia   CAD (coronary artery disease) of bypass graft VG to RCA   S/P PTCA (percutaneous transluminal coronary angioplasty) prox RCA 08/05/21    Diagnostic Studies/Procedures    Cardiac cath 08/05/21   Mid LAD lesion is 100% stenosed.   Dist LM to Ost LAD lesion is 95% stenosed.   Ost Cx to Prox Cx lesion is 80% stenosed.   Dist RCA lesion is 50% stenosed.   Mid RCA lesion is 95% stenosed.   Prox RCA-1 lesion is 99% stenosed.   Prox RCA-2 lesion is 50% stenosed.   Prox RCA to Mid RCA lesion is 80% stenosed.   Origin lesion is 100% stenosed.   Balloon angioplasty was performed.   Post intervention, there is a 75% residual stenosis.   The left ventricular systolic function is normal.   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   The LAD has 95% calcified ostial stenosis and is totally occluded after first septal perforating artery and small diagonal vessel.   The circumflex vessel has diffuse proximal 80% stenosis.   The native right coronary artery has significant coronary calcification with 99% eccentric calcified stenosis on the proximal bend in the vessel followed by 5080 and 95% mid stenoses with 50% distal stenosis.   Patent Y graft with a LIMA portion to the LAD and SVG portion to the OM1 vessel.   Total ostial occlusion of the vein graft which had supplied the PDA vessel.   Very difficult PCI to the calcified RCA with ultimate PTCA of the 99% eccentric proximal stenosis to approximately 75%.  Due to the vessel calcification, angulation, and catheter backup difficulties, despite attempt using a  telescope guideliner the wound catheters were never able to get beyond the proximal stenosis.  At the completion of the procedure, there was brisk TIMI-3 flow, the patient was chest pain-free and had stable hemodynamics.   RECOMMENDATION: The patient has not been on any medical therapy for angina, BP or lipid management.  Apparently she has refused statins in the past. Will initiate low-dose beta-blocker therapy with metoprolol tartrate 12.5 mg twice a day and initiate amlodipine 2.5 mg.  Depending upon tolerability may be able to initiate isosorbide as an outpatient.  Would strongly recommend initiation of statin therapy or consideration of Zetia and PCSK9 inhibition.   _____________   History of Present Illness     Sarah Pena is a 81 y.o. female with  CAD s/p 3v CABG LIMA to LAD, VG to OM and VG to PDA, HTN, HLD, tobacco use, COPD, asthma presented to [facility] for chest pain on 08/03/21.  Her husband also with CAD gave her NTG and her pain improved.  She did stop smoking in 2019.  She did not understand her home meds at discharge in 2019 so she did not take them.    Pt was seen by cardiology at [facility] and transferred to [facility] for cardiac cath.  Troponin I 0.07 to 0.09 in [place] and hgb 13.9  CXR NAD.  EKG with sR at 72 and RBBB.  Non specific TW changes.  IV  heparin was added.    Hospital Course     Consultants: none   Pt arrived at [facility] and underwent cardiac cath.  VG to PDA was 100% occluded , LIMA and VG to OM1 were patent.   She had PTCA - difficult PCI to Calcified  RCA occlusion of 99% was opened to 75%.  Due to the vessel calcification, angulation, and catheter backup difficulties, despite attempt using a telescope guideliner the wound catheters were never able to get beyond the proximal stenosis.  At the completion of the procedure, there was brisk TIMI-3 flow, the patient was chest pain-free and had stable hemodynamics.  Today EKG is stable SR with RBBB.  She has no pain  and feels good.  We discussed her refusal of statins. She is adamant.  She would like a better understanding of her meds - pharmacy is to speak with her prior to discharge.    She ambulated with cardiac rehab without issues. She has been seen and evaluated by Dr. Harl Bowie and found stable for discharge.  She prefers to be seen in [place] by Dr. Agustin Cree.  She has agreed to be seen in lipid clinic.   Did the patient have an acute coronary syndrome (MI, NSTEMI, STEMI, etc) this admission?:  Yes                               AHA/ACC Clinical Performance & Quality Measures: Aspirin prescribed? - Yes ADP Receptor Inhibitor (Plavix/Clopidogrel, Brilinta/Ticagrelor or Effient/Prasugrel) prescribed (includes medically managed patients)? - Yes Beta Blocker prescribed? - Yes High Intensity Statin (Lipitor 40-80mg  or Crestor 20-40mg ) prescribed? - no  pt refused EF assessed during THIS hospitalization? - Yes For EF <40%, was ACEI/ARB prescribed? - Not Applicable (EF >/= 16%) For EF <40%, Aldosterone Antagonist (Spironolactone or Eplerenone) prescribed? - Not Applicable (EF >/= 10%) Cardiac Rehab Phase II ordered (including medically managed patients)? - Yes         _____________  Discharge Vitals Blood pressure 123/61, pulse 86, temperature 97.8 F (36.6 C), temperature source Oral, resp. rate 16, weight 52.7 kg, SpO2 95 %.  Filed Weights   08/04/21 2105 08/05/21 0600 08/06/21 0630  Weight: 52.8 kg 52.8 kg 52.7 kg    Labs & Radiologic Studies    CBC Recent Labs    08/05/21 0134 08/06/21 0553  WBC 8.1 7.9  HGB 14.0 12.6  HCT 42.4 37.8  MCV 93.8 94.3  PLT 215 960   Basic Metabolic Panel Recent Labs    08/05/21 0134 08/06/21 0553  NA 136 139  K 3.8 3.6  CL 109 109  CO2 20* 23  GLUCOSE 85 93  BUN 12 11  CREATININE 0.72 0.74  CALCIUM 9.2 8.9   Liver Function Tests Recent Labs    08/05/21 0134  AST 43*  ALT 51*  ALKPHOS 57  BILITOT 0.6  PROT 6.0*  ALBUMIN 3.2*   No  results for input(s): LIPASE, AMYLASE in the last 72 hours. High Sensitivity Troponin:   No results for input(s): TROPONINIHS in the last 720 hours.  BNP Invalid input(s): POCBNP D-Dimer No results for input(s): DDIMER in the last 72 hours. Hemoglobin A1C No results for input(s): HGBA1C in the last 72 hours. Fasting Lipid Panel Recent Labs    08/06/21 0553  CHOL 200  HDL 52  LDLCALC 129*  TRIG 93  CHOLHDL 3.8   Thyroid Function Tests No results for input(s): TSH, T4TOTAL, T3FREE, THYROIDAB  in the last 72 hours.  Invalid input(s): FREET3 _____________  CARDIAC CATHETERIZATION  Result Date: 08/05/2021   Mid LAD lesion is 100% stenosed.   Dist LM to Ost LAD lesion is 95% stenosed.   Ost Cx to Prox Cx lesion is 80% stenosed.   Dist RCA lesion is 50% stenosed.   Mid RCA lesion is 95% stenosed.   Prox RCA-1 lesion is 99% stenosed.   Prox RCA-2 lesion is 50% stenosed.   Prox RCA to Mid RCA lesion is 80% stenosed.   Origin lesion is 100% stenosed.   Balloon angioplasty was performed.   Post intervention, there is a 75% residual stenosis.   The left ventricular systolic function is normal. 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 The LAD has 95% calcified ostial stenosis and is totally occluded after first septal perforating artery and small diagonal vessel. The circumflex vessel has diffuse proximal 80% stenosis. The native right coronary artery has significant coronary calcification with 99% eccentric calcified stenosis on the proximal bend in the vessel followed by 5080 and 95% mid stenoses with 50% distal stenosis. Patent Y graft with a LIMA portion to the LAD and SVG portion to the OM1 vessel. Total ostial occlusion of the vein graft which had supplied the PDA vessel. Very difficult PCI to the calcified RCA with ultimate PTCA of the 99% eccentric proximal stenosis to approximately 75%.  Due to the vessel calcification, angulation, and catheter backup difficulties, despite  attempt using a telescope guideliner the wound catheters were never able to get beyond the proximal stenosis.  At the completion of the procedure, there was brisk TIMI-3 flow, the patient was chest pain-free and had stable hemodynamics. RECOMMENDATION: The patient has not been on any medical therapy for angina, BP or lipid management.  Apparently she has refused statins in the past. Will initiate low-dose beta-blocker therapy with metoprolol tartrate 12.5 mg twice a day and initiate amlodipine 2.5 mg.  Depending upon tolerability may be able to initiate isosorbide as an outpatient.  Would strongly recommend initiation of statin therapy or consideration of Zetia and PCSK9 inhibition.   Disposition   Pt is being discharged home today in good condition.  Follow-up Plans & Appointments   Call [facility] at [phone] if any bleeding, swelling or drainage at cath site.  May shower, no tub baths for 48 hours for groin sticks. No lifting over 5 pounds for 3 days.  No Driving for 3 days.  Take 1 NTG, under your tongue, while sitting.  If no relief of pain may repeat NTG, one tab every 5 minutes up to 3 tablets total over 15 minutes.  If no relief CALL 911.  If you have dizziness/lightheadness  while taking NTG, stop taking and call 911.         Heart healthy diet  Order has been sent to cardiac rehab in [place].    Follow-up Information     Park Liter, MD Follow up on 08/07/2021.   Specialty: Cardiology Why: the office will call with date and time. Contact information: [place] [address] [phone]         Pixie Casino, MD Follow up.   Specialty: Cardiology Why: for cholesterol medication review and eval. Contact information: [place] [address] [phone]                Discharge Instructions     Amb Referral to Cardiac Rehabilitation   Complete  by: As directed    Referred to [place]  phase II cardiac rehab   Diagnosis: PTCA   After initial evaluation and assessments completed: Virtual Based Care may be provided alone or in conjunction with Phase 2 Cardiac Rehab based on patient barriers.: Yes       Discharge Medications   Allergies as of 08/06/2021       Reactions   Codeine Nausea And Vomiting   Statins Other (See Comments)   Patient refuses> practices holistic lifestyle        Medication List     STOP taking these medications    VITAMIN E PO       TAKE these medications    albuterol 108 (90 Base) MCG/ACT inhaler Commonly known as: ProAir HFA Inhale 2 puffs into the lungs every 6 (six) hours as needed for wheezing or shortness of breath.   amLODipine 2.5 MG tablet Commonly known as: NORVASC Take 1 tablet (2.5 mg total) by mouth daily. Start taking on: August 07, 2021   aspirin 81 MG tablet Take 81 mg by mouth at bedtime.   calcium carbonate 500 MG chewable tablet Commonly known as: TUMS - dosed in mg elemental calcium Chew 1 tablet by mouth daily as needed for indigestion or heartburn.   Dextrose-Fructose-Sod Citrate [phone] MG Chew Chew 2 tablets by mouth as needed (nausea).   Elderberry 575 MG/5ML Syrp Take 15 mLs by mouth daily.   ezetimibe 10 MG tablet Commonly known as: ZETIA Take 1 tablet (10 mg total) by mouth daily.   Magnesium 250 MG Tabs Take 250 mg by mouth 2 (two) times daily.   metoprolol tartrate 25 MG tablet Commonly known as: LOPRESSOR Take 0.5 tablets (12.5 mg total) by mouth 2 (two) times daily.   nitroGLYCERIN 0.4 MG SL tablet Commonly known as: NITROSTAT Place 1 tablet (0.4 mg total) under the tongue every 5 (five) minutes x 3 doses as needed for chest pain.   OIL OF OREGANO PO Take 1 capsule by mouth daily.   OVER THE COUNTER MEDICATION Place 6 drops under the tongue 2 (two) times daily. CBD oil   ticagrelor 90 MG Tabs tablet Commonly known as: BRILINTA Take 1 tablet (90 mg total) by mouth 2  (two) times daily.   Trelegy Ellipta 100-62.5-25 MCG/ACT Aepb Generic drug: Fluticasone-Umeclidin-Vilant Inhale 1 puff into the lungs daily.   TURMERIC PO Take 1 capsule by mouth daily.   Vitamin D-3 25 MCG (1000 UT) Caps Take 1,000 Units by mouth daily.           Outstanding Labs/Studies   Check hepatic and lipid in 8 weeks I sent referral to lipid clinic with Dr. Debara Pickett Consider echo as outpt EF with cath 55%.   Duration of Discharge Encounter   Greater than 30 minutes including physician time.  Signed, Cecilie Kicks, NP 08/06/2021, 11:25 AM

## 2021-08-06 NOTE — Discharge Instructions (Addendum)
Information about your medication: Brilinta (anti-platelet agent)  Generic Name (Brand): ticagrelor (Brilinta), twice daily medication  PURPOSE: You are taking this medication along with aspirin to lower your chance of having a heart attack, stroke, or blood clots in your heart stent. These can be fatal. Brilinta and aspirin help prevent platelets from sticking together and forming a clot that can block an artery or your stent.   Common SIDE EFFECTS you may experience include: bruising or bleeding more easily, shortness of breath  Do not stop taking BRILINTA without talking to the doctor who prescribes it for you. People who are treated with a stent and stop taking Brilinta too soon, have a higher risk of getting a blood clot in the stent, having a heart attack, or dying. If you stop Brilinta because of bleeding, or for other reasons, your risk of a heart attack or stroke may increase.   Avoid taking NSAID agents or anti-inflammatory medications such as ibuprofen, naproxen given increased bleed risk with plavix - can use acetaminophen (Tylenol) if needed for pain.  Tell all of your doctors and dentists that you are taking Brilinta. They should talk to the doctor who prescribed Brilinta for you before you have any surgery or invasive procedure.   Contact your health care provider if you experience: severe or uncontrollable bleeding, pink/red/brown urine, vomiting blood or vomit that looks like "coffee grounds", red or black stools (looks like tar), coughing up blood or blood clots ----------------------------------------------------------------------------------------------------------------------   Call [facility] at [phone] if any bleeding, swelling or drainage at cath site.  May shower, no tub baths for 48 hours for groin sticks. No lifting over 5 pounds for 3 days.  No Driving for 3 days.  Take 1 NTG, under your tongue, while sitting.  If no relief of pain may  repeat NTG, one tab every 5 minutes up to 3 tablets total over 15 minutes.  If no relief CALL 911.  If you have dizziness/lightheadness  while taking NTG, stop taking and call 911.         Heart healthy diet  Order has been sent to cardiac rehab in [place].

## 2021-08-07 ENCOUNTER — Telehealth (HOSPITAL_COMMUNITY): Payer: Self-pay

## 2021-08-07 NOTE — Telephone Encounter (Signed)
Per phase I cardiac rehab, fax cardiac rehab referral to [place] cardiac rehab. °

## 2021-08-10 DIAGNOSIS — H40013 Open angle with borderline findings, low risk, bilateral: Secondary | ICD-10-CM | POA: Diagnosis not present

## 2021-08-13 ENCOUNTER — Ambulatory Visit: Payer: Medicare PPO | Admitting: Cardiology

## 2021-08-13 ENCOUNTER — Other Ambulatory Visit: Payer: Self-pay

## 2021-08-13 ENCOUNTER — Encounter: Payer: Self-pay | Admitting: Cardiology

## 2021-08-13 VITALS — BP 120/68 | HR 66 | Ht 59.0 in | Wt 117.0 lb

## 2021-08-13 DIAGNOSIS — I252 Old myocardial infarction: Secondary | ICD-10-CM

## 2021-08-13 DIAGNOSIS — Z951 Presence of aortocoronary bypass graft: Secondary | ICD-10-CM | POA: Diagnosis not present

## 2021-08-13 DIAGNOSIS — Z9861 Coronary angioplasty status: Secondary | ICD-10-CM

## 2021-08-13 DIAGNOSIS — I2510 Atherosclerotic heart disease of native coronary artery without angina pectoris: Secondary | ICD-10-CM

## 2021-08-13 DIAGNOSIS — E7800 Pure hypercholesterolemia, unspecified: Secondary | ICD-10-CM | POA: Diagnosis not present

## 2021-08-13 DIAGNOSIS — I10 Essential (primary) hypertension: Secondary | ICD-10-CM | POA: Diagnosis not present

## 2021-08-13 NOTE — Progress Notes (Signed)
Cardiology Office Note:    Date:  08/13/2021   ID:  Sarah Pena, DOB 1940-12-19, MRN [id number]  PCP:  Deland Pretty, MD  Cardiologist:  Jenne Campus, MD    Referring MD: Deland Pretty, MD   Chief Complaint  Patient presents with   Hospitalization Follow-up  Am feeling much better  History of Present Illness:    Sarah Pena is a 81 y.o. female with past medical history significant for coronary artery disease, in 2019 she did have coronary bypass graft x3, it was LIMA to LAD SVG to obtuse marginal SVG to PDA, essential hypertension, dyslipidemia, history of tobacco abuse, COPD.  Few weeks ago she ended up coming to the hospital with chief complaint of chest pain.  Decision has been made to pursue cardiac catheterization.  Eventually she was transferred to [facility] and cardiac catheterization was performed.  She did have PTCA and stenting done of the proximal RCA however procedure was very difficult.  She was put on dual antiplatelet therapy and she is coming today to my office.  Overall she is doing well.  Denies have any chest pain tightness squeezing pressure burning chest no palpitations dizziness swelling of lower extremities  Past Medical History:  Diagnosis Date   Asthma 05/2015   CAD (coronary artery disease) of bypass graft VG to RCA 08/06/2021   COPD (chronic obstructive pulmonary disease) ([place])    Family history of heart disease    History of hiatal hernia    'small"   History of kidney stones    Melanoma ([place])    Melanoma of chest wall   Myocardial infarction ([place])    "mild"  years ago   S/P PTCA (percutaneous transluminal coronary angioplasty) prox RCA 08/06/2021   Shortness of breath dyspnea    just recently- sob walking up stairs- "I think its pollen.    Past Surgical History:  Procedure Laterality Date   APPENDECTOMY     CHOLECYSTECTOMY N/A 10/29/2014   Procedure: LAPAROSCOPIC CHOLECYSTECTOMY WITH INTRAOPERATIVE CHOLANGIOGRAM;  Surgeon: Jackolyn Confer,  MD;  Location: [place];  Service: General;  Laterality: N/A;   COLONOSCOPY     CORONARY ARTERY BYPASS GRAFT N/A 08/18/2017   Procedure: CORONARY ARTERY BYPASS GRAFTING (CABG) x 3, USING LEFT INTERNAL MAMMARY ARTERY AND RIGHT GREATER SAPHENOUS VEIN HARVESTED ENDOSCOPICALLY;  Surgeon: Melrose Nakayama, MD;  Location: [facility];  Service: Open Heart Surgery;  Laterality: N/A;   CORONARY BALLOON ANGIOPLASTY N/A 08/05/2021   Procedure: CORONARY BALLOON ANGIOPLASTY;  Surgeon: Troy Sine, MD;  Location: [facility];  Service: Cardiovascular;  Laterality: N/A;   EYE SURGERY Bilateral    catarct with lens replacement   HERNIA REPAIR     RIH   LEFT HEART CATH AND CORONARY ANGIOGRAPHY N/A 08/18/2017   Procedure: LEFT HEART CATH AND CORONARY ANGIOGRAPHY;  Surgeon: [name], MD;  Location: [facility];  Service: Cardiovascular;  Laterality: N/A;   LEFT HEART CATH AND CORS/GRAFTS ANGIOGRAPHY N/A 08/05/2021   Procedure: LEFT HEART CATH AND CORS/GRAFTS ANGIOGRAPHY;  Surgeon: Troy Sine, MD;  Location: [place] [facility];  Service: Cardiovascular;  Laterality: N/A;   lipoma removed  1993   RLQ area   MELANOMA EXCISION     TEE WITHOUT CARDIOVERSION N/A 08/18/2017   Procedure: TRANSESOPHAGEAL ECHOCARDIOGRAM (TEE);  Surgeon: Melrose Nakayama, MD;  Location: [place];  Service: Open Heart Surgery;  Laterality: N/A;   TUBAL LIGATION      Current Medications: Current  Meds  Medication Sig   albuterol (PROAIR HFA) 108 (90 Base) MCG/ACT inhaler Inhale 2 puffs into the lungs every 6 (six) hours as needed for wheezing or shortness of breath.   amLODipine (NORVASC) 2.5 MG tablet Take 1 tablet (2.5 mg total) by mouth daily.   aspirin 81 MG tablet Take 81 mg by mouth at bedtime.   calcium carbonate (TUMS - DOSED IN MG ELEMENTAL CALCIUM) 500 MG chewable tablet Chew 1 tablet by mouth daily as needed for indigestion or heartburn.   Cholecalciferol (VITAMIN D-3) 1000 UNITS CAPS Take 1,000 Units  by mouth daily.    Dextrose-Fructose-Sod Citrate [phone] MG CHEW Chew 2 tablets by mouth as needed (nausea).    Elderberry 575 MG/5ML SYRP Take 15 mLs by mouth daily.   ezetimibe (ZETIA) 10 MG tablet Take 1 tablet (10 mg total) by mouth daily.   Fluticasone-Umeclidin-Vilant (TRELEGY ELLIPTA) 100-62.5-25 MCG/INH AEPB Inhale 1 puff into the lungs daily.   Magnesium 250 MG TABS Take 250 mg by mouth 2 (two) times daily.   metoprolol tartrate (LOPRESSOR) 25 MG tablet Take 0.5 tablets (12.5 mg total) by mouth 2 (two) times daily.   nitroGLYCERIN (NITROSTAT) 0.4 MG SL tablet Place 1 tablet (0.4 mg total) under the tongue every 5 (five) minutes x 3 doses as needed for chest pain.   OIL OF OREGANO PO Take 1 capsule by mouth daily.    OVER THE COUNTER MEDICATION Place 6 drops under the tongue 2 (two) times daily. CBD oil   ticagrelor (BRILINTA) 90 MG TABS tablet Take 1 tablet (90 mg total) by mouth 2 (two) times daily.   TURMERIC PO Take 1 capsule by mouth daily.      Allergies:   Codeine, Ibandronic acid, and Statins   Social History   Socioeconomic History   Marital status: Married    Spouse name: Not on file   Number of children: Not on file   Years of education: Not on file   Highest education level: Not on file  Occupational History   Not on file  Tobacco Use   Smoking status: Former    Packs/day: 1.00    Years: 50.00    Pack years: 50.00    Types: Cigarettes    Quit date: 05/22/2014    Years since quitting: 7.2   Smokeless tobacco: Never   Tobacco comments:    quit 05/2014  Vaping Use   Vaping Use: Never used  Substance and Sexual Activity   Alcohol use: Yes    Alcohol/week: 0.0 standard drinks    Comment: rarely   Drug use: No   Sexual activity: Not on file  Other Topics Concern   Not on file  Social History Narrative   Not on file   Social Determinants of Health   Financial Resource Strain: Not on file  Food Insecurity: Not on file  Transportation Needs: Not on  file  Physical Activity: Not on file  Stress: Not on file  Social Connections: Not on file     Family History: The patient's family history includes Cancer in her maternal grandfather; Heart disease in her father and mother. There is no history of Colon polyps, Colon cancer, Esophageal cancer, Stomach cancer, or Rectal cancer. ROS:   Please see the history of present illness.    All 14 point review of systems negative except as described per history of present illness  EKGs/Labs/Other Studies Reviewed:      Recent Labs: 08/05/2021: ALT 51 08/06/2021: BUN 11; Creatinine,  Ser 0.74; Hemoglobin 12.6; Platelets 213; Potassium 3.6; Sodium 139  Recent Lipid Panel    Component Value Date/Time   CHOL 200 08/06/2021 0553   CHOL 220 (H) 07/31/2019 0848   TRIG 93 08/06/2021 0553   HDL 52 08/06/2021 0553   HDL 70 07/31/2019 0848   CHOLHDL 3.8 08/06/2021 0553   VLDL 19 08/06/2021 0553   LDLCALC 129 (H) 08/06/2021 0553   LDLCALC 136 (H) 07/31/2019 0848    Physical Exam:    VS:  BP 120/68 (BP Location: Left Arm, Patient Position: Sitting, Cuff Size: Normal)    Pulse 66    Ht 4\' 11"  (1.499 m)    Wt 117 lb (53.1 kg)    SpO2 97%    BMI 23.63 kg/m     Wt Readings from Last 3 Encounters:  08/13/21 117 lb (53.1 kg)  08/06/21 116 lb 1.6 oz (52.7 kg)  03/18/21 122 lb 12.8 oz (55.7 kg)     GEN:  Well nourished, well developed in no acute distress HEENT: Normal NECK: No JVD; No carotid bruits LYMPHATICS: No lymphadenopathy CARDIAC: RRR, no murmurs, no rubs, no gallops RESPIRATORY:  Clear to auscultation without rales, wheezing or rhonchi  ABDOMEN: Soft, non-tender, non-distended MUSCULOSKELETAL:  No edema; No deformity  SKIN: Warm and dry LOWER EXTREMITIES: no swelling NEUROLOGIC:  Alert and oriented x 3 PSYCHIATRIC:  Normal affect   ASSESSMENT:    1. Hx of CABG X 3  2019   2. Coronary artery disease with history of myocardial infarction without history of CABG   3. Primary  hypertension   4. S/P PTCA (percutaneous transluminal coronary angioplasty) prox RCA 08/05/21   5. Pure hypercholesterolemia    PLAN:    In order of problems listed above:  Coronary artery disease status post coronary bypass grafting 2019, related recent PTCA and stenting of the right coronary artery.  The procedure was difficult.  She is on dual antiplatelet therapy doing well which I will continue. Dyslipidemia she is intolerant to statin.  She was put on Zetia.  In the next month I will check her fasting lipid profile I anticipate need to put her on PCSK9 engine to avoid progression of the problem Essential hypertension: Blood pressure well controlled continue present management. We did talk about the healthy lifestyle need to exercise on the regular basis good diet I discussed basic of Mediterranean diet with her.   Medication Adjustments/Labs and Tests Ordered: Current medicines are reviewed at length with the patient today.  Concerns regarding medicines are outlined above.  No orders of the defined types were placed in this encounter.  Medication changes: No orders of the defined types were placed in this encounter.   Signed, Park Liter, MD, [facility] 08/13/2021 12:45 PM    [place]

## 2021-08-13 NOTE — Patient Instructions (Signed)
Medication Instructions:  Your physician recommends that you continue on your current medications as directed. Please refer to the Current Medication list given to you today.  *If you need a refill on your cardiac medications before your next appointment, please call your pharmacy*   Lab Work: NONE If you have labs (blood work) drawn today and your tests are completely normal, you will receive your results only by: [place] (if you have MyChart) OR A paper copy in the mail If you have any lab test that is abnormal or we need to change your treatment, we will call you to review the results.   Testing/Procedures: NONE   Follow-Up: At [facility], you and your health needs are our priority.  As part of our continuing mission to provide you with exceptional heart care, we have created designated Provider Care Teams.  These Care Teams include your primary Cardiologist (physician) and Advanced Practice Providers (APPs -  Physician Assistants and Nurse Practitioners) who all work together to provide you with the care you need, when you need it.  We recommend signing up for the patient portal called "MyChart".  Sign up information is provided on this After Visit Summary.  MyChart is used to connect with patients for Virtual Visits (Telemedicine).  Patients are able to view lab/test results, encounter notes, upcoming appointments, etc.  Non-urgent messages can be sent to your provider as well.   To learn more about what you can do with MyChart, go to [email or web address].    Your next appointment:   1 month(s)  The format for your next appointment:   In Person  Provider:   Jenne Campus, MD    Other Instructions

## 2021-08-27 ENCOUNTER — Other Ambulatory Visit: Payer: Self-pay | Admitting: Pulmonary Disease

## 2021-08-27 DIAGNOSIS — J069 Acute upper respiratory infection, unspecified: Secondary | ICD-10-CM | POA: Diagnosis not present

## 2021-08-27 DIAGNOSIS — J449 Chronic obstructive pulmonary disease, unspecified: Secondary | ICD-10-CM

## 2021-08-27 DIAGNOSIS — Z20828 Contact with and (suspected) exposure to other viral communicable diseases: Secondary | ICD-10-CM | POA: Diagnosis not present

## 2021-08-27 DIAGNOSIS — R0981 Nasal congestion: Secondary | ICD-10-CM | POA: Diagnosis not present

## 2021-08-28 ENCOUNTER — Telehealth: Payer: Self-pay | Admitting: Pulmonary Disease

## 2021-08-31 ENCOUNTER — Encounter: Payer: Self-pay | Admitting: Nurse Practitioner

## 2021-08-31 ENCOUNTER — Ambulatory Visit: Payer: Medicare PPO | Admitting: Nurse Practitioner

## 2021-08-31 ENCOUNTER — Other Ambulatory Visit: Payer: Self-pay

## 2021-08-31 ENCOUNTER — Ambulatory Visit (INDEPENDENT_AMBULATORY_CARE_PROVIDER_SITE_OTHER): Payer: Medicare PPO

## 2021-08-31 VITALS — BP 118/70 | HR 82 | Temp 97.7°F | Ht 59.0 in | Wt 116.4 lb

## 2021-08-31 DIAGNOSIS — J441 Chronic obstructive pulmonary disease with (acute) exacerbation: Secondary | ICD-10-CM | POA: Diagnosis not present

## 2021-08-31 DIAGNOSIS — I25810 Atherosclerosis of coronary artery bypass graft(s) without angina pectoris: Secondary | ICD-10-CM

## 2021-08-31 DIAGNOSIS — R0609 Other forms of dyspnea: Secondary | ICD-10-CM

## 2021-08-31 DIAGNOSIS — J069 Acute upper respiratory infection, unspecified: Secondary | ICD-10-CM | POA: Insufficient documentation

## 2021-08-31 DIAGNOSIS — R0602 Shortness of breath: Secondary | ICD-10-CM | POA: Diagnosis not present

## 2021-08-31 DIAGNOSIS — R0902 Hypoxemia: Secondary | ICD-10-CM | POA: Diagnosis not present

## 2021-08-31 MED ORDER — TRELEGY ELLIPTA 100-62.5-25 MCG/ACT IN AEPB: 1.0000 | INHALATION_SPRAY | Freq: Every day | RESPIRATORY_TRACT | 3 refills | Status: DC

## 2021-08-31 MED ORDER — PREDNISONE 10 MG PO TABS: ORAL_TABLET | ORAL | 0 refills | Status: DC

## 2021-08-31 NOTE — Assessment & Plan Note (Signed)
Resolving URI symptoms. Continue supportive care. COVID/flu negative.

## 2021-08-31 NOTE — Patient Instructions (Addendum)
Continue Trelegy 1 puff daily. Brush tongue and rinse mouth afterwards -Continue Albuterol inhaler 2 puffs every 6 hours as needed for shortness of breath or wheezing. Notify if symptoms persist despite rescue inhaler/neb use. -Complete your antibiotics in their entirety   Prednisone taper. 4 tabs for 2 days, then 3 tabs for 2 days, 2 tabs for 2 days, then 1 tab for 2 days, then stop. Take in AM with food. Mucinex 600 mg Twice daily for chest congestion Delsym 2 tsp every 12 hours as needed for cough Continue tessalon perles as prescribed.  Monitor oxygen level at home and notify if <88-90%. Seek emergency care if worsening symptoms develop.   Follow up in one week with Dr. Elsworth Soho or Alanson Aly If symptoms do not improve or worsen, please contact office for sooner follow up or seek emergency care.

## 2021-08-31 NOTE — Progress Notes (Signed)
@Patient  ID: Sarah Pena, female    DOB: 01-02-1941, 81 y.o.   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 She is a patient of Dr. Bari Mantis and last seen in office 03/18/2021 by Parrett,NP. Past medical history significant for HTN, CAD s/p CABGx3 2019, GERD, osteoporosis, hx of adenomatous colonic polyps, HLD.  TEST/EVENTS:  03/2008 CTA: 8 mm lesion in right apex 02/2015 spirometry: severe airway obstruction with FEV1 0.9 (52%), FVC 1.4 (59%), ratio 61 03/2015 CT chest: <46mm stable b/l upper lobe ground glass nodules  05/2016 CT chest: unchanged nodules when compared to previous 07/2016 spirometry: improved FEV1 62%  03/18/2021: OV with Parrett NP. Maintained on Trelegy with breathing stable. Follows with lung cancer screening program. F/u 6 months  08/31/2021: Today - acute Patient presents today for worsening shortness of breath and URI symptoms. Her symptoms started Wednesday evening last week. She was seen in urgent care Thursday and started on amoxicillin, z pack, and short prednisone burst which she has felt minimal improvement on. Her URI symptoms have improved but she continues to experience shortness of breath upon minimal exertion and she has been coughing persistently. She describes it as congested but she is unable to get anything up. It has been keeping her up at night with associated PND, which has cause fatigue. She is experiencing some associated chest tightness and occasional wheeze. She was tested for COVID and flu, which were both negative. She denies fevers, chills, orthopnea, chest pain, or leg swelling. She recently had cardiac cath with extensive CAD, with balloon arthoplasty in cath lab and residual 75% stenosis. She is not a CABG candidate as she just had one 4 years ago. She continues on Trelegy  inhaler and is using her rescue a few times a day.   Allergies  Allergen Reactions   Codeine Nausea And Vomiting   Ibandronic Acid     Other reaction(s): Headache   Statins Other (See Comments)    Patient refuses> practices holistic lifestyle    Immunization History  Administered Date(s) Administered   Fluad Quad(high Dose 65+) 03/22/2019, 03/18/2021   Influenza, High Dose Seasonal PF 04/11/2017, 05/25/2017, 04/25/2018   Influenza, Quadrivalent, Recombinant, Inj, Pf 05/25/2017, 03/22/2019   Influenza,inj,Quad PF,6+ Mos 03/13/2015, 03/26/2016   Influenza,inj,quad, With Preservative 04/20/2017   Janssen (J&J) SARS-COV-2 Vaccination 10/09/2019   Pneumococcal Conjugate-13 05/12/2015   Pneumococcal Polysaccharide-23 04/11/2013   Tdap 08/03/2018   Zoster Recombinat (Shingrix) 03/25/2020, 07/01/2020    Past Medical History:  Diagnosis Date   Asthma 05/2015   CAD (coronary artery disease) of bypass graft VG to RCA 08/06/2021   COPD (chronic obstructive pulmonary disease) ([place])    Family history of heart disease    History of hiatal hernia    'small"   History of kidney stones    Melanoma ([place])    Melanoma of chest wall   Myocardial infarction ([place])    "mild"  years ago   S/P PTCA (percutaneous transluminal coronary angioplasty) prox RCA 08/06/2021   Shortness of breath dyspnea    just recently- sob walking up stairs- "I think its pollen.    Tobacco History: Social History   Tobacco Use  Smoking Status Former   Packs/day: 1.00   Years: 50.00   Pack years: 50.00   Types: Cigarettes   Quit date:  05/22/2014   Years since quitting: 7.2  Smokeless Tobacco Never  Tobacco Comments   quit 05/2014   Counseling given: Not Answered Tobacco comments: quit 05/2014   Outpatient Medications Prior to Visit  Medication Sig Dispense Refill   albuterol (PROAIR HFA) 108 (90 Base) MCG/ACT inhaler Inhale 2 puffs into the lungs every 6 (six) hours as needed for wheezing or shortness of  breath. 56 g 3   amLODipine (NORVASC) 2.5 MG tablet Take 1 tablet (2.5 mg total) by mouth daily. 30 tablet 6   aspirin 81 MG tablet Take 81 mg by mouth at bedtime.     calcium carbonate (TUMS - DOSED IN MG ELEMENTAL CALCIUM) 500 MG chewable tablet Chew 1 tablet by mouth daily as needed for indigestion or heartburn.     Cholecalciferol (VITAMIN D-3) 1000 UNITS CAPS Take 1,000 Units by mouth daily.      Dextrose-Fructose-Sod Citrate [phone] MG CHEW Chew 2 tablets by mouth as needed (nausea).      Elderberry 575 MG/5ML SYRP Take 15 mLs by mouth daily.     ezetimibe (ZETIA) 10 MG tablet Take 1 tablet (10 mg total) by mouth daily. 30 tablet 6   Fluticasone-Umeclidin-Vilant (TRELEGY ELLIPTA) 100-62.5-25 MCG/ACT AEPB Inhale 1 puff into the lungs daily. 28 each 3   Fluticasone-Umeclidin-Vilant (TRELEGY ELLIPTA) 100-62.5-25 MCG/INH AEPB Inhale 1 puff into the lungs daily. 180 each 3   Magnesium 250 MG TABS Take 250 mg by mouth 2 (two) times daily.     metoprolol tartrate (LOPRESSOR) 25 MG tablet Take 0.5 tablets (12.5 mg total) by mouth 2 (two) times daily. 30 tablet 6   nitroGLYCERIN (NITROSTAT) 0.4 MG SL tablet Place 1 tablet (0.4 mg total) under the tongue every 5 (five) minutes x 3 doses as needed for chest pain. 25 tablet 4   OIL OF OREGANO PO Take 1 capsule by mouth daily.      OVER THE COUNTER MEDICATION Place 6 drops under the tongue 2 (two) times daily. CBD oil     ticagrelor (BRILINTA) 90 MG TABS tablet Take 1 tablet (90 mg total) by mouth 2 (two) times daily. 60 tablet 11   TURMERIC PO Take 1 capsule by mouth daily.      No facility-administered medications prior to visit.     Review of Systems:   Constitutional: No weight loss or gain, night sweats, fevers, chills. +fatigue HEENT: No headaches, difficulty swallowing, tooth/dental problems, or sore throat. No sneezing, itching, ear ache. +nasal congestion, significantly improved CV:  +PND. No chest pain, orthopnea, swelling in lower  extremities, anasarca, dizziness, palpitations, syncope Resp: +shortness of breath with exertion; congested cough; occasional wheeze. No excess mucus or change in color of mucus. No hemoptysis. No chest wall deformity GI:  No heartburn, indigestion, abdominal pain, nausea, vomiting, diarrhea, change in bowel habits, loss of appetite, bloody stools.  GU: No dysuria, change in color of urine, urgency or frequency.  No flank pain, no hematuria  Skin: No rash, lesions, ulcerations MSK:  No joint pain or swelling.  No decreased range of motion.  No back pain. Neuro: No dizziness or lightheadedness.  Psych: No depression or anxiety. Mood stable.     Physical Exam:  BP 118/70 (BP Location: Left Arm, Patient Position: Sitting, Cuff Size: Normal)    Pulse 82    Temp 97.7 F (36.5 C) (Oral)    Ht 4\' 11"  (1.499 m)    Wt 116 lb 6.4 oz (52.8 kg)    SpO2 90%  BMI 23.51 kg/m   GEN: Pleasant, interactive, well-nourished; in no acute distress. HEENT:  Normocephalic and atraumatic. EACs patent bilaterally. TM pearly gray with present light reflex bilaterally. PERRLA. Sclera white. Nasal turbinates pink, moist and patent bilaterally. No rhinorrhea present. Oropharynx pink and moist, without exudate or edema. No lesions, ulcerations, or postnasal drip.  NECK:  Supple w/ fair ROM. No JVD present. Normal carotid impulses w/o bruits. Thyroid symmetrical with no goiter or nodules palpated. No lymphadenopathy.   CV: RRR, no m/r/g, no peripheral edema. Pulses intact, +2 bilaterally. No cyanosis, pallor or clubbing. PULMONARY:  Unlabored, regular breathing. Scattered rhonchi with diminished bases bilaterally A&P. No accessory muscle use. No dullness to percussion. GI: BS present and normoactive. Soft, non-tender to palpation. No organomegaly or masses detected. No CVA tenderness. MSK: No erythema, warmth or tenderness. Cap refil <2 sec all extrem. No deformities or joint swelling noted.  Neuro: A/Ox3. No focal  deficits noted.   Skin: Warm, no lesions or rashe Psych: Normal affect and behavior. Judgement and thought content appropriate.     Lab Results:  CBC    Component Value Date/Time   WBC 7.9 08/06/2021 0553   RBC 4.01 08/06/2021 0553   HGB 12.6 08/06/2021 0553   HGB 13.6 08/16/2017 1556   HCT 37.8 08/06/2021 0553   HCT 41.4 08/16/2017 1556   PLT 213 08/06/2021 0553   PLT 273 08/16/2017 1556   MCV 94.3 08/06/2021 0553   MCV 90 08/16/2017 1556   MCH 31.4 08/06/2021 0553   MCHC 33.3 08/06/2021 0553   RDW 14.0 08/06/2021 0553   RDW 14.3 08/16/2017 1556   LYMPHSABS 2.1 04/23/2019 1123   LYMPHSABS 2.8 08/16/2017 1556   MONOABS 0.8 04/23/2019 1123   EOSABS 0.2 04/23/2019 1123   EOSABS 0.5 (H) 08/16/2017 1556   BASOSABS 0.1 04/23/2019 1123   BASOSABS 0.0 08/16/2017 1556    BMET    Component Value Date/Time   NA 139 08/06/2021 0553   NA 141 08/16/2017 1556   K 3.6 08/06/2021 0553   CL 109 08/06/2021 0553   CO2 23 08/06/2021 0553   GLUCOSE 93 08/06/2021 0553   BUN 11 08/06/2021 0553   BUN 12 08/16/2017 1556   CREATININE 0.74 08/06/2021 0553   CALCIUM 8.9 08/06/2021 0553   GFRNONAA >60 08/06/2021 0553   GFRAA >60 08/22/2017 0329    BNP No results found for: BNP   Imaging:  08/31/2021: CXR reviewed by me and without acute process noted. Right epicardial fat pad is again noted and unchanged.   DG Chest 2 View  Result Date: 08/31/2021 CLINICAL DATA:  Short of breath, hypoxia EXAM: CHEST - 2 VIEW COMPARISON:  07/30/2021 FINDINGS: Midline sternotomy overlies stable cardiac silhouette. Prominent RIGHT epicardial fat pad again noted. No effusion, infiltrate or pneumothorax. No acute osseous abnormality. IMPRESSION: No acute cardiopulmonary process. Electronically Signed   By: Suzy Bouchard M.D.   On: 08/31/2021 16:31   CARDIAC CATHETERIZATION  Result Date: 08/05/2021   Mid LAD lesion is 100% stenosed.   Dist LM to Ost LAD lesion is 95% stenosed.   Ost Cx to Prox Cx  lesion is 80% stenosed.   Dist RCA lesion is 50% stenosed.   Mid RCA lesion is 95% stenosed.   Prox RCA-1 lesion is 99% stenosed.   Prox RCA-2 lesion is 50% stenosed.   Prox RCA to Mid RCA lesion is 80% stenosed.   Origin lesion is 100% stenosed.   Balloon angioplasty was performed.   Post  intervention, there is a 75% residual stenosis.   The left ventricular systolic function is normal. 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 The LAD has 95% calcified ostial stenosis and is totally occluded after first septal perforating artery and small diagonal vessel. The circumflex vessel has diffuse proximal 80% stenosis. The native right coronary artery has significant coronary calcification with 99% eccentric calcified stenosis on the proximal bend in the vessel followed by 5080 and 95% mid stenoses with 50% distal stenosis. Patent Y graft with a LIMA portion to the LAD and SVG portion to the OM1 vessel. Total ostial occlusion of the vein graft which had supplied the PDA vessel. Very difficult PCI to the calcified RCA with ultimate PTCA of the 99% eccentric proximal stenosis to approximately 75%.  Due to the vessel calcification, angulation, and catheter backup difficulties, despite attempt using a telescope guideliner the wound catheters were never able to get beyond the proximal stenosis.  At the completion of the procedure, there was brisk TIMI-3 flow, the patient was chest pain-free and had stable hemodynamics. RECOMMENDATION: The patient has not been on any medical therapy for angina, BP or lipid management.  Apparently she has refused statins in the past. Will initiate low-dose beta-blocker therapy with metoprolol tartrate 12.5 mg twice a day and initiate amlodipine 2.5 mg.  Depending upon tolerability may be able to initiate isosorbide as an outpatient.  Would strongly recommend initiation of statin therapy or consideration of Zetia and PCSK9 inhibition.      No flowsheet data found.  No  results found for: NITRICOXIDE   08/31/2021 Walking oximetry without desaturation. SpO2 low 90% on room air.   Assessment & Plan:   COPD with acute exacerbation ([place]) AECOPD related to URI. CXR clear and without evidence of superimposed infection. Tx from urgent care with two abx courses and short prednisone burst. Will treat with prednisone taper today. Cough management with Delsym and continues tessalon perles. Unable to tolerate codeine in syrups. Oxygen stable in office. Close follow up.  Patient Instructions  Continue Trelegy 1 puff daily. Brush tongue and rinse mouth afterwards -Continue Albuterol inhaler 2 puffs every 6 hours as needed for shortness of breath or wheezing. Notify if symptoms persist despite rescue inhaler/neb use. -Complete your antibiotics in their entirety   Prednisone taper. 4 tabs for 2 days, then 3 tabs for 2 days, 2 tabs for 2 days, then 1 tab for 2 days, then stop. Take in AM with food. Mucinex 600 mg Twice daily for chest congestion Delsym 2 tsp every 12 hours as needed for cough Continue tessalon perles as prescribed.  Monitor oxygen level at home and notify if <88-90%. Seek emergency care if worsening symptoms develop.   Follow up in one week with Dr. Elsworth Soho or Alanson Aly If symptoms do not improve or worsen, please contact office for sooner follow up or seek emergency care.    CAD (coronary artery disease) of bypass graft VG to RCA Significant CAD on recent cardiac cath with some vessels 100%. Balloon arthoplasty with 75% afterwards. Due to this, EKG obtained to ensure to r/o MI with atypical presentation - EKG with RBBB and some ST depression which is unchanged from January '23. Advised if symptoms do not improve or worsen, to seek emergency care.   URI (upper respiratory infection) Resolving URI symptoms. Continue supportive care. COVID/flu negative.      Clayton Bibles, NP 08/31/2021  Pt aware and understands NP's role.

## 2021-08-31 NOTE — Assessment & Plan Note (Signed)
AECOPD related to URI. CXR clear and without evidence of superimposed infection. Tx from urgent care with two abx courses and short prednisone burst. Will treat with prednisone taper today. Cough management with Delsym and continues tessalon perles. Unable to tolerate codeine in syrups. Oxygen stable in office. Close follow up.  Patient Instructions  Continue Trelegy 1 puff daily. Brush tongue and rinse mouth afterwards -Continue Albuterol inhaler 2 puffs every 6 hours as needed for shortness of breath or wheezing. Notify if symptoms persist despite rescue inhaler/neb use. -Complete your antibiotics in their entirety   Prednisone taper. 4 tabs for 2 days, then 3 tabs for 2 days, 2 tabs for 2 days, then 1 tab for 2 days, then stop. Take in AM with food. Mucinex 600 mg Twice daily for chest congestion Delsym 2 tsp every 12 hours as needed for cough Continue tessalon perles as prescribed.  Monitor oxygen level at home and notify if <88-90%. Seek emergency care if worsening symptoms develop.   Follow up in one week with Dr. Elsworth Soho or Alanson Aly If symptoms do not improve or worsen, please contact office for sooner follow up or seek emergency care.

## 2021-08-31 NOTE — Assessment & Plan Note (Addendum)
Significant CAD on recent cardiac cath with some vessels 100%. Balloon arthoplasty with 75% afterwards. Due to this, EKG obtained to ensure to r/o MI with atypical presentation - EKG with RBBB and some ST depression which is unchanged from January '23. Advised if symptoms do not improve or worsen, to seek emergency care.

## 2021-08-31 NOTE — Telephone Encounter (Signed)
Called patient and informed her that her trelegy was refilled and had no clue was it was denied. Sent to [facility].   Nothing further needed

## 2021-09-07 ENCOUNTER — Ambulatory Visit: Payer: Medicare PPO | Admitting: Nurse Practitioner

## 2021-09-07 ENCOUNTER — Other Ambulatory Visit: Payer: Self-pay

## 2021-09-07 ENCOUNTER — Encounter: Payer: Self-pay | Admitting: Nurse Practitioner

## 2021-09-07 VITALS — BP 130/74 | HR 71 | Temp 98.4°F | Ht 59.0 in | Wt 116.6 lb

## 2021-09-07 DIAGNOSIS — R911 Solitary pulmonary nodule: Secondary | ICD-10-CM | POA: Diagnosis not present

## 2021-09-07 DIAGNOSIS — J432 Centrilobular emphysema: Secondary | ICD-10-CM | POA: Diagnosis not present

## 2021-09-07 DIAGNOSIS — I25810 Atherosclerosis of coronary artery bypass graft(s) without angina pectoris: Secondary | ICD-10-CM | POA: Diagnosis not present

## 2021-09-07 DIAGNOSIS — Z87891 Personal history of nicotine dependence: Secondary | ICD-10-CM

## 2021-09-07 NOTE — Assessment & Plan Note (Signed)
Breathing stable. AECOPD resolved. Still has a mild, post viral cough. Advised to continue cough suppression regimen and finish prednisone and notify if cough worsens or does not improve over the next few weeks. Continue triple therapy regimen - if cough persists, could consider change from DPI and further postnasal drainage control.   Patient Instructions  -Continue Trelegy 1 puff daily. Brush tongue and rinse mouth afterwards -Continue Albuterol inhaler 2 puffs every 6 hours as needed for shortness of breath or wheezing. Notify if symptoms persist despite rescue inhaler/neb use. -Complete prednisone taper  -Continue Delsym 2 tsp  -Can do chlortab 4 mg over the counter At bedtime as needed for cough   Referred to lung cancer screening program.   Follow up in 3 months with Dr. Elsworth Soho or Alanson Aly If symptoms do not improve or worsen, please contact office for sooner follow up or seek emergency care.

## 2021-09-07 NOTE — Assessment & Plan Note (Signed)
Most recent LDCT 2021. Stopped following in lung cancer screening program due to aging out; however, guidelines have since changed and advised screening until age of 21. Discussed this with pt and referred back to screening program.

## 2021-09-07 NOTE — Progress Notes (Signed)
@Patient  ID: Sarah Pena, female    DOB: 1941-05-31, 81 y.o.   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 She is a patient of Dr. Bari Mantis and last seen in office on 08/31/2021 by Claudia Greenley,NP. Past medical history significant for HTN, CAD s/p CABGx3 2019, GERD, osteoporosis, hx of adenomatous colonic polyps, HLD.  TEST/EVENTS:  03/2008 CTA: 8 mm lesion in right apex 02/2015 spirometry: severe airway obstruction with FEV1 0.9 (52%), FVC 1.4 (59%), ratio 61 03/2015 CT chest: <3 mm stable b/l upper lobe ground glass nodules 05/2016 CT chest: unchanged nodules when compared to previous 07/2016 spirometry: improved FEV1 62% 08/01/2019 LDCT lung cancer screening: biapical pleuroparenchymal scarring with scar and volume loss within the lingula and RML. Multiple small pulmonary nodules scattered, largest of which is LLL 5 mm. Lung-RADS2, benign. R adrenal gland nodule 1.5 cm.   03/18/2021: OV with [place] NP. Maintained on Trelegy with breathing stable. Follows with lung cancer screening program. F/u 6 months.   08/31/2021: Ok [place] with Heston Widener NP for acute sick visit. Worsening SOB and URI symptoms with cough. Tx in urgent care with amoxicillin, z pack and short prednisone burst. Had some minimal improvement. CXR at visit without acute process. Tx for AECOPD with prednisone taper. Postnasal drainage control and cough control with delsym and tessalon perles. Walking oximetry without desaturation. Close follow up  09/07/2021: Today - follow up  Patient presents today for follow up for follow up after being treated for AECOPD last week. She reports that she feels much better and her shortness of breath has mostly resolved and returned to baseline. She still has an occasional dry cough that is well managed with Delsym.  She denies any persistent postnasal drip symptoms, wheezing, orthopnea, PND, chest pain or lower extremity swelling. No fevers and fatigue has improved. She has two days left of her prednisone. She stopped the mucinex due to it making her feel sick. She continues on Trelegy daily. She has not been using her albuterol much since her last visit.   Allergies  Allergen Reactions   Codeine Nausea And Vomiting   Ibandronic Acid     Other reaction(s): Headache   Statins Other (See Comments)    Patient refuses> practices holistic lifestyle    Immunization History  Administered Date(s) Administered   Fluad Quad(high Dose 65+) 03/22/2019, 03/18/2021   Influenza, High Dose Seasonal PF 04/11/2017, 05/25/2017, 04/25/2018   Influenza, Quadrivalent, Recombinant, Inj, Pf 05/25/2017, 03/22/2019   Influenza,inj,Quad PF,6+ Mos 03/13/2015, 03/26/2016   Influenza,inj,quad, With Preservative 04/20/2017   Janssen (J&J) SARS-COV-2 Vaccination 10/09/2019   Pneumococcal Conjugate-13 05/12/2015   Pneumococcal Polysaccharide-23 04/11/2013   Tdap 08/03/2018   Zoster Recombinat (Shingrix) 03/25/2020, 07/01/2020    Past Medical History:  Diagnosis Date   Asthma 05/2015   CAD (coronary artery disease) of bypass graft VG to RCA 08/06/2021   COPD (chronic obstructive pulmonary disease) ([place])    Family history of heart disease    History of hiatal hernia    'small"   History of kidney stones    Melanoma ([place])    Melanoma of chest wall   Myocardial infarction ([place])    "mild"  years ago   S/P PTCA (percutaneous transluminal coronary angioplasty) prox RCA 08/06/2021   Shortness of breath dyspnea  just recently- sob walking up stairs- "I think its pollen.    Tobacco History: Social History   Tobacco Use  Smoking Status Former   Packs/day: 1.00   Years: 50.00   Pack years: 50.00   Types: Cigarettes   Quit date: 05/22/2014   Years since quitting: 7.3  Smokeless Tobacco Never  Tobacco Comments   quit  05/2014   Counseling given: Not Answered Tobacco comments: quit 05/2014   Outpatient Medications Prior to Visit  Medication Sig Dispense Refill   albuterol (PROAIR HFA) 108 (90 Base) MCG/ACT inhaler Inhale 2 puffs into the lungs every 6 (six) hours as needed for wheezing or shortness of breath. 56 g 3   amLODipine (NORVASC) 2.5 MG tablet Take 1 tablet (2.5 mg total) by mouth daily. 30 tablet 6   aspirin 81 MG tablet Take 81 mg by mouth at bedtime.     calcium carbonate (TUMS - DOSED IN MG ELEMENTAL CALCIUM) 500 MG chewable tablet Chew 1 tablet by mouth daily as needed for indigestion or heartburn.     Cholecalciferol (VITAMIN D-3) 1000 UNITS CAPS Take 1,000 Units by mouth daily.      Dextrose-Fructose-Sod Citrate [phone] MG CHEW Chew 2 tablets by mouth as needed (nausea).      Elderberry 575 MG/5ML SYRP Take 15 mLs by mouth daily.     ezetimibe (ZETIA) 10 MG tablet Take 1 tablet (10 mg total) by mouth daily. 30 tablet 6   Fluticasone-Umeclidin-Vilant (TRELEGY ELLIPTA) 100-62.5-25 MCG/ACT AEPB Inhale 1 puff into the lungs daily. 28 each 3   Fluticasone-Umeclidin-Vilant (TRELEGY ELLIPTA) 100-62.5-25 MCG/INH AEPB Inhale 1 puff into the lungs daily. 180 each 3   Magnesium 250 MG TABS Take 250 mg by mouth 2 (two) times daily.     metoprolol tartrate (LOPRESSOR) 25 MG tablet Take 0.5 tablets (12.5 mg total) by mouth 2 (two) times daily. 30 tablet 6   nitroGLYCERIN (NITROSTAT) 0.4 MG SL tablet Place 1 tablet (0.4 mg total) under the tongue every 5 (five) minutes x 3 doses as needed for chest pain. 25 tablet 4   OIL OF OREGANO PO Take 1 capsule by mouth daily.      OVER THE COUNTER MEDICATION Place 6 drops under the tongue 2 (two) times daily. CBD oil     predniSONE (DELTASONE) 10 MG tablet 4 tabs for 2 days, then 3 tabs for 2 days, 2 tabs for 2 days, then 1 tab for 2 days, then stop 20 tablet 0   ticagrelor (BRILINTA) 90 MG TABS tablet Take 1 tablet (90 mg total) by mouth 2 (two) times daily. 60  tablet 11   TURMERIC PO Take 1 capsule by mouth daily.      No facility-administered medications prior to visit.     Review of Systems:   Constitutional: No weight loss or gain, night sweats, fevers, chills, fatigue, or lassitude. HEENT: No headaches, difficulty swallowing, tooth/dental problems, or sore throat. No sneezing, itching, ear ache, nasal congestion, or post nasal drip CV:  No chest pain, orthopnea, PND, swelling in lower extremities, anasarca, dizziness, palpitations, syncope Resp: +occasional dry cough. shortness of breath with strenuous activity (improved; returned to baseline). No excess mucus or change in color of mucus. No hemoptysis. No wheezing.  No chest wall deformity GI:  No heartburn, indigestion, abdominal pain, nausea, vomiting, diarrhea, change in bowel habits, loss of appetite, bloody stools.  GU: No dysuria, change in color of urine, urgency or frequency.  No flank pain, no hematuria  Skin:  No rash, lesions, ulcerations MSK:  No joint pain or swelling.  No decreased range of motion.  No back pain. Neuro: No dizziness or lightheadedness.  Psych: No depression or anxiety. Mood stable.     Physical Exam:  BP 130/74 (BP Location: Right Arm, Cuff Size: Normal)    Pulse 71    Temp 98.4 F (36.9 C) (Oral)    Ht 4\' 11"  (1.499 m)    Wt 116 lb 9.6 oz (52.9 kg)    SpO2 97%    BMI 23.55 kg/m   GEN: Pleasant, interactive, well-nourished; in no acute distress. HEENT:  Normocephalic and atraumatic. EACs patent bilaterally. TM pearly gray with present light reflex bilaterally. PERRLA. Sclera white. Nasal turbinates pink, moist and patent bilaterally. No rhinorrhea present. Oropharynx pink and moist, without exudate or edema. No lesions, ulcerations, or postnasal drip.  NECK:  Supple w/ fair ROM. No JVD present. Normal carotid impulses w/o bruits. Thyroid symmetrical with no goiter or nodules palpated. No lymphadenopathy.   CV: RRR, no m/r/g, no peripheral edema. Pulses  intact, +2 bilaterally. No cyanosis, pallor or clubbing. PULMONARY:  Unlabored, regular breathing. Clear bilaterally A&P w/o wheezes/rales/rhonchi. No accessory muscle use. No dullness to percussion. GI: BS present and normoactive. Soft, non-tender to palpation. No organomegaly or masses detected. No CVA tenderness. MSK: No erythema, warmth or tenderness. Cap refil <2 sec all extrem. No deformities or joint swelling noted.  Neuro: A/Ox3. No focal deficits noted.   Skin: Warm, no lesions or rashe Psych: Normal affect and behavior. Judgement and thought content appropriate.     Lab Results:  CBC    Component Value Date/Time   WBC 7.9 08/06/2021 0553   RBC 4.01 08/06/2021 0553   HGB 12.6 08/06/2021 0553   HGB 13.6 08/16/2017 1556   HCT 37.8 08/06/2021 0553   HCT 41.4 08/16/2017 1556   PLT 213 08/06/2021 0553   PLT 273 08/16/2017 1556   MCV 94.3 08/06/2021 0553   MCV 90 08/16/2017 1556   MCH 31.4 08/06/2021 0553   MCHC 33.3 08/06/2021 0553   RDW 14.0 08/06/2021 0553   RDW 14.3 08/16/2017 1556   LYMPHSABS 2.1 04/23/2019 1123   LYMPHSABS 2.8 08/16/2017 1556   MONOABS 0.8 04/23/2019 1123   EOSABS 0.2 04/23/2019 1123   EOSABS 0.5 (H) 08/16/2017 1556   BASOSABS 0.1 04/23/2019 1123   BASOSABS 0.0 08/16/2017 1556    BMET    Component Value Date/Time   NA 139 08/06/2021 0553   NA 141 08/16/2017 1556   K 3.6 08/06/2021 0553   CL 109 08/06/2021 0553   CO2 23 08/06/2021 0553   GLUCOSE 93 08/06/2021 0553   BUN 11 08/06/2021 0553   BUN 12 08/16/2017 1556   CREATININE 0.74 08/06/2021 0553   CALCIUM 8.9 08/06/2021 0553   GFRNONAA >60 08/06/2021 0553   GFRAA >60 08/22/2017 0329    BNP No results found for: BNP   Imaging:  DG Chest 2 View  Result Date: 08/31/2021 CLINICAL DATA:  Short of breath, hypoxia EXAM: CHEST - 2 VIEW COMPARISON:  07/30/2021 FINDINGS: Midline sternotomy overlies stable cardiac silhouette. Prominent RIGHT epicardial fat pad again noted. No effusion,  infiltrate or pneumothorax. No acute osseous abnormality. IMPRESSION: No acute cardiopulmonary process. Electronically Signed   By: Suzy Bouchard M.D.   On: 08/31/2021 16:31      No flowsheet data found.  No results found for: NITRICOXIDE      Assessment & Plan:   COPD (chronic obstructive pulmonary disease) (  HCC) Breathing stable. AECOPD resolved. Still has a mild, post viral cough. Advised to continue cough suppression regimen and finish prednisone and notify if cough worsens or does not improve over the next few weeks. Continue triple therapy regimen - if cough persists, could consider change from DPI and further postnasal drainage control.   Patient Instructions  -Continue Trelegy 1 puff daily. Brush tongue and rinse mouth afterwards -Continue Albuterol inhaler 2 puffs every 6 hours as needed for shortness of breath or wheezing. Notify if symptoms persist despite rescue inhaler/neb use. -Complete prednisone taper  -Continue Delsym 2 tsp  -Can do chlortab 4 mg over the counter At bedtime as needed for cough   Referred to lung cancer screening program.   Follow up in 3 months with Dr. Elsworth Soho or Alanson Aly If symptoms do not improve or worsen, please contact office for sooner follow up or seek emergency care.    Solitary pulmonary nodule Most recent LDCT 2021. Stopped following in lung cancer screening program due to aging out; however, guidelines have since changed and advised screening until age of 71. Discussed this with pt and referred back to screening program.   CAD (coronary artery disease) of bypass graft VG to RCA Significant CAD on recent cardiac cath with some vessels 100%. Balloon arthoplasty with 75% afterwards. No acute complaints today. Follow up with cardiology in coming weeks as scheduled.     Clayton Bibles, NP 09/07/2021  Pt aware and understands NP's role.

## 2021-09-07 NOTE — Assessment & Plan Note (Signed)
Significant CAD on recent cardiac cath with some vessels 100%. Balloon arthoplasty with 75% afterwards. No acute complaints today. Follow up with cardiology in coming weeks as scheduled.

## 2021-09-07 NOTE — Patient Instructions (Addendum)
-  Continue Trelegy 1 puff daily. Brush tongue and rinse mouth afterwards -Continue Albuterol inhaler 2 puffs every 6 hours as needed for shortness of breath or wheezing. Notify if symptoms persist despite rescue inhaler/neb use. -Complete prednisone taper  -Continue Delsym 2 tsp  -Can do chlortab 4 mg over the counter At bedtime as needed for cough   Referred to lung cancer screening program.   Follow up in 3 months with Dr. Elsworth Soho or Alanson Aly If symptoms do not improve or worsen, please contact office for sooner follow up or seek emergency care.

## 2021-09-16 ENCOUNTER — Encounter: Payer: Self-pay | Admitting: Cardiology

## 2021-09-16 ENCOUNTER — Other Ambulatory Visit: Payer: Self-pay

## 2021-09-16 ENCOUNTER — Ambulatory Visit: Payer: Medicare PPO | Admitting: Cardiology

## 2021-09-16 VITALS — BP 118/76 | HR 86 | Ht 59.0 in | Wt 115.6 lb

## 2021-09-16 DIAGNOSIS — I10 Essential (primary) hypertension: Secondary | ICD-10-CM | POA: Diagnosis not present

## 2021-09-16 DIAGNOSIS — Z951 Presence of aortocoronary bypass graft: Secondary | ICD-10-CM

## 2021-09-16 DIAGNOSIS — Z87891 Personal history of nicotine dependence: Secondary | ICD-10-CM | POA: Diagnosis not present

## 2021-09-16 DIAGNOSIS — E7800 Pure hypercholesterolemia, unspecified: Secondary | ICD-10-CM

## 2021-09-16 DIAGNOSIS — I252 Old myocardial infarction: Secondary | ICD-10-CM | POA: Diagnosis not present

## 2021-09-16 DIAGNOSIS — I2510 Atherosclerotic heart disease of native coronary artery without angina pectoris: Secondary | ICD-10-CM

## 2021-09-16 NOTE — Progress Notes (Signed)
Cardiology Office Note:    Date:  09/16/2021   ID:  Sarah Pena, DOB 10-22-1940, MRN [id number]  PCP:  Deland Pretty, MD  Cardiologist:  Jenne Campus, MD    Referring MD: Deland Pretty, MD   No chief complaint on file. I am doing fine  History of Present Illness:    Sarah Pena is a 81 y.o. female with past medical history significant for coronary artery disease.  In 2019 she had coronary artery bypass graft x3 with LIMA to LAD SVG to obtuse marginal branch and SVG to PDA.  She also have history of essential hypertension dyslipidemia history of tobacco abuse, COPD. In January she had to come to the hospital with chest pain cardiac catheterization has been performed she was find to have completely occluded graft to RCA however negative RCA os open she was put on dual antiplatelet therapy and seems to be doing well from that point review.  She comes today for regular follow-up.  Doing well denies have any chest pain tightness squeezing pressure burning chest still on dual antiplatelet therapy  Past Medical History:  Diagnosis Date   Asthma 05/2015   CAD (coronary artery disease) of bypass graft VG to RCA 08/06/2021   Chest pain 08/06/2014   Chest pain   Closed fracture of greater tuberosity of humerus 07/21/2018   COPD (chronic obstructive pulmonary disease) ([place])    Family history of heart disease    Former smoker 07/27/2019   H/O: hysterectomy 11/29/2019   Healthcare maintenance 07/27/2019   History of chest pain 06/24/2020   History of cholecystectomy 07/17/2020   History of hiatal hernia    'small"   History of kidney stones    History of total abdominal hysterectomy 07/17/2020   Hx of adenomatous colonic polyps 04/23/2019   Melanoma ([place])    Melanoma of chest wall   Myocardial infarction ([place])    "mild"  years ago   Osteoporosis 06/24/2020   Pain of left hip joint 10/24/2020   Pure hypercholesterolemia 06/24/2020   S/P PTCA (percutaneous transluminal coronary angioplasty)  prox RCA 08/06/2021   Shortness of breath dyspnea    just recently- sob walking up stairs- "I think its pollen.   Shoulder pain 04/12/2018   Solitary pulmonary nodule 03/12/2015   -noted 2009 CT chest 05/2016 >Small pulmonary nodules in both lungs are unchanged from previous exam. The largest is in the posterior left lower lobe measuring 78m.   Stiffness of right shoulder joint 06/27/2018   Trigger thumb of left hand 12/29/2017   Vitamin D deficiency 06/24/2020    Past Surgical History:  Procedure Laterality Date   APPENDECTOMY     CHOLECYSTECTOMY N/A 10/29/2014   Procedure: LAPAROSCOPIC CHOLECYSTECTOMY WITH INTRAOPERATIVE CHOLANGIOGRAM;  Surgeon: TJackolyn Confer MD;  [facility]  Service: General;  Laterality: N/A;   COLONOSCOPY     CORONARY ARTERY BYPASS GRAFT N/A 08/18/2017   Procedure: CORONARY ARTERY BYPASS GRAFTING (CABG) x 3, USING LEFT INTERNAL MAMMARY ARTERY AND RIGHT GREATER SAPHENOUS VEIN HARVESTED ENDOSCOPICALLY;  Surgeon: HMelrose Nakayama MD;  [facility]  Service: Open Heart Surgery;  Laterality: N/A;   CORONARY BALLOON ANGIOPLASTY N/A 08/05/2021   Procedure: CORONARY BALLOON ANGIOPLASTY;  Surgeon: KTroy Sine MD;  [facility];  Service: Cardiovascular;  Laterality: N/A;   EYE SURGERY Bilateral    catarct with lens replacement   HERNIA REPAIR     RIH   LEFT HEART CATH AND CORONARY ANGIOGRAPHY N/A 08/18/2017  Procedure: LEFT HEART CATH AND CORONARY ANGIOGRAPHY;  Surgeon: [name], MD;  Location: [facility];  Service: Cardiovascular;  Laterality: N/A;   LEFT HEART CATH AND CORS/GRAFTS ANGIOGRAPHY N/A 08/05/2021   Procedure: LEFT HEART CATH AND CORS/GRAFTS ANGIOGRAPHY;  Surgeon: Troy Sine, MD;  Location: [facility];  Service: Cardiovascular;  Laterality: N/A;   lipoma removed  1993   RLQ area   MELANOMA EXCISION     TEE WITHOUT CARDIOVERSION N/A 08/18/2017   Procedure: TRANSESOPHAGEAL ECHOCARDIOGRAM (TEE);  Surgeon:  Melrose Nakayama, MD;  Location: [facility];  Service: Open Heart Surgery;  Laterality: N/A;   TUBAL LIGATION      Current Medications: Current Meds  Medication Sig   albuterol (PROAIR HFA) 108 (90 Base) MCG/ACT inhaler Inhale 2 puffs into the lungs every 6 (six) hours as needed for wheezing or shortness of breath.   amLODipine (NORVASC) 2.5 MG tablet Take 1 tablet (2.5 mg total) by mouth daily.   aspirin 81 MG tablet Take 81 mg by mouth at bedtime.   calcium carbonate (TUMS - DOSED IN MG ELEMENTAL CALCIUM) 500 MG chewable tablet Chew 1 tablet by mouth daily as needed for indigestion or heartburn.   Cholecalciferol (VITAMIN D-3) 1000 UNITS CAPS Take 1,000 Units by mouth daily.    Dextrose-Fructose-Sod Citrate [phone] MG CHEW Chew 2 tablets by mouth as needed (nausea).    Elderberry 575 MG/5ML SYRP Take 15 mLs by mouth daily.   ezetimibe (ZETIA) 10 MG tablet Take 1 tablet (10 mg total) by mouth daily.   Fluticasone-Umeclidin-Vilant (TRELEGY ELLIPTA) 100-62.5-25 MCG/ACT AEPB Inhale 1 puff into the lungs daily.   Fluticasone-Umeclidin-Vilant (TRELEGY ELLIPTA) 100-62.5-25 MCG/INH AEPB Inhale 1 puff into the lungs daily.   Magnesium 250 MG TABS Take 250 mg by mouth 2 (two) times daily.   metoprolol tartrate (LOPRESSOR) 25 MG tablet Take 0.5 tablets (12.5 mg total) by mouth 2 (two) times daily.   nitroGLYCERIN (NITROSTAT) 0.4 MG SL tablet Place 1 tablet (0.4 mg total) under the tongue every 5 (five) minutes x 3 doses as needed for chest pain.   OIL OF OREGANO PO Take 1 capsule by mouth daily.    OVER THE COUNTER MEDICATION Place 6 drops under the tongue 2 (two) times daily. CBD oil   ticagrelor (BRILINTA) 90 MG TABS tablet Take 1 tablet (90 mg total) by mouth 2 (two) times daily.   TURMERIC PO Take 1 capsule by mouth daily.    [DISCONTINUED] predniSONE (DELTASONE) 10 MG tablet 4 tabs for 2 days, then 3 tabs for 2 days, 2 tabs for 2 days, then 1 tab for 2 days, then stop (Patient taking  differently: 10 mg. 4 tabs for 2 days, then 3 tabs for 2 days, 2 tabs for 2 days, then 1 tab for 2 days, then stop)     Allergies:   Codeine, Ibandronic acid, and Statins   Social History   Socioeconomic History   Marital status: Married    Spouse name: Not on file   Number of children: Not on file   Years of education: Not on file   Highest education level: Not on file  Occupational History   Not on file  Tobacco Use   Smoking status: Former    Packs/day: 1.00    Years: 50.00    Pack years: 50.00    Types: Cigarettes    Quit date: 05/22/2014    Years since quitting: 7.3   Smokeless tobacco: Never  Tobacco comments:    quit 05/2014  Vaping Use   Vaping Use: Never used  Substance and Sexual Activity   Alcohol use: Yes    Alcohol/week: 0.0 standard drinks    Comment: rarely   Drug use: No   Sexual activity: Not on file  Other Topics Concern   Not on file  Social History Narrative   Not on file   Social Determinants of Health   Financial Resource Strain: Not on file  Food Insecurity: Not on file  Transportation Needs: Not on file  Physical Activity: Not on file  Stress: Not on file  Social Connections: Not on file     Family History: The patient's family history includes Cancer in her maternal grandfather; Heart disease in her father and mother. There is no history of Colon polyps, Colon cancer, Esophageal cancer, Stomach cancer, or Rectal cancer. ROS:   Please see the history of present illness.    All 14 point review of systems negative except as described per history of present illness  EKGs/Labs/Other Studies Reviewed:      Recent Labs: 08/05/2021: ALT 51 08/06/2021: BUN 11; Creatinine, Ser 0.74; Hemoglobin 12.6; Platelets 213; Potassium 3.6; Sodium 139  Recent Lipid Panel    Component Value Date/Time   CHOL 200 08/06/2021 0553   CHOL 220 (H) 07/31/2019 0848   TRIG 93 08/06/2021 0553   HDL 52 08/06/2021 0553   HDL 70 07/31/2019 0848   CHOLHDL 3.8  08/06/2021 0553   VLDL 19 08/06/2021 0553   LDLCALC 129 (H) 08/06/2021 0553   LDLCALC 136 (H) 07/31/2019 0848    Physical Exam:    VS:  BP 118/76 (BP Location: Right Arm, Patient Position: Sitting)    Pulse 86    Ht '4\' 11"'$  (1.499 m)    Wt 115 lb 9.6 oz (52.4 kg)    SpO2 98%    BMI 23.35 kg/m     Wt Readings from Last 3 Encounters:  09/16/21 115 lb 9.6 oz (52.4 kg)  09/07/21 116 lb 9.6 oz (52.9 kg)  08/31/21 116 lb 6.4 oz (52.8 kg)     GEN:  Well nourished, well developed in no acute distress HEENT: Normal NECK: No JVD; No carotid bruits LYMPHATICS: No lymphadenopathy CARDIAC: RRR, no murmurs, no rubs, no gallops RESPIRATORY:  Clear to auscultation without rales, wheezing or rhonchi  ABDOMEN: Soft, non-tender, non-distended MUSCULOSKELETAL:  No edema; No deformity  SKIN: Warm and dry LOWER EXTREMITIES: no swelling NEUROLOGIC:  Alert and oriented x 3 PSYCHIATRIC:  Normal affect   ASSESSMENT:    1. Coronary artery disease with history of myocardial infarction without history of CABG   2. Primary hypertension   3. Former smoker   4. Hx of CABG X 3  2019   5. Pure hypercholesterolemia    PLAN:    In order of problems listed above:  Coronary artery disease status post PTCA and stenting of the native RCA.  Doing well from that point review.  Dual antiplatelet therapy will be continued for life please 6 months. Essential hypertension: Blood pressure seems to well controlled continue present management. Former smoker: Does not smoke encouraged her to stay away from smoking. History of coronary artery bypass graft.  Noted. Dyslipidemia her cholesterol still unacceptably high.  Her LDL was 129.  She is intolerant to statin.  I will refer her to lipid clinic for PCSK9 agent   Medication Adjustments/Labs and Tests Ordered: Current medicines are reviewed at length with the patient today.  Concerns regarding medicines are outlined above.  No orders of the defined types were placed  in this encounter.  Medication changes: No orders of the defined types were placed in this encounter.   Signed, Park Liter, MD, [facility] 09/16/2021 8:20 AM    [place]

## 2021-09-16 NOTE — Patient Instructions (Signed)
Medication Instructions:  °Your physician recommends that you continue on your current medications as directed. Please refer to the Current Medication list given to you today. ° °*If you need a refill on your cardiac medications before your next appointment, please call your pharmacy* ° ° °Lab Work: °None °If you have labs (blood work) drawn today and your tests are completely normal, you will receive your results only by: °MyChart Message (if you have MyChart) OR °A paper copy in the mail °If you have any lab test that is abnormal or we need to change your treatment, we will call you to review the results. ° ° °Testing/Procedures: °None ° ° °Follow-Up: °At [facility], you and your health needs are our priority.  As part of our continuing mission to provide you with exceptional heart care, we have created designated Provider Care Teams.  These Care Teams include your primary Cardiologist (physician) and Advanced Practice Providers (APPs -  Physician Assistants and Nurse Practitioners) who all work together to provide you with the care you need, when you need it. ° °We recommend signing up for the patient portal called "MyChart".  Sign up information is provided on this After Visit Summary.  MyChart is used to connect with patients for Virtual Visits (Telemedicine).  Patients are able to view lab/test results, encounter notes, upcoming appointments, etc.  Non-urgent messages can be sent to your provider as well.   °To learn more about what you can do with MyChart, go to [email or web address].   ° °Your next appointment:   °3 month(s) ° °The format for your next appointment:   °In Person ° °Provider:   °Robert Krasowski, MD  ° ° °Other Instructions °None ° °

## 2021-09-23 ENCOUNTER — Ambulatory Visit: Payer: Medicare PPO | Admitting: Pharmacist

## 2021-09-23 ENCOUNTER — Other Ambulatory Visit: Payer: Self-pay

## 2021-09-23 ENCOUNTER — Encounter: Payer: Self-pay | Admitting: Pharmacist

## 2021-09-23 ENCOUNTER — Telehealth: Payer: Self-pay | Admitting: Pulmonary Disease

## 2021-09-23 VITALS — BP 146/86 | HR 85 | Resp 16 | Ht 59.0 in | Wt 108.4 lb

## 2021-09-23 DIAGNOSIS — I25810 Atherosclerosis of coronary artery bypass graft(s) without angina pectoris: Secondary | ICD-10-CM | POA: Diagnosis not present

## 2021-09-23 DIAGNOSIS — E7800 Pure hypercholesterolemia, unspecified: Secondary | ICD-10-CM | POA: Diagnosis not present

## 2021-09-23 NOTE — Telephone Encounter (Signed)
Called and spoke with St[facility] at [facility] and she states that it was too soon for patient to refill prescription. Marzetta Board states that she has already told the patient this multiple times and cant refill till 09/27/21/ Marzetta Board stated she was going to call patient and talk to her. Nothing further needed  ?

## 2021-09-23 NOTE — Progress Notes (Signed)
Patient ID: Sarah Pena                 DOB: [birth year]                    MRN: [id number] ? ? ? ? ?HPI: ?Sarah Pena is a 81 y.o. female patient referred to lipid clinic by Dr Agustin Cree. PMH is significant for CAD, HTN, HLD and history of smoking (quit 2015). Patient recently admitted on 08/03/21 for cadiac cath. Patient refused statin therapy. ? ?Patient presents today with daughter. Reports she has had no energy since hospital discharge which she relates to medications she was put on. Reports she was able to walk and move around better before her cath.  Has numerous questions regarding medications and what the indication for each is for. ? ?Daughter sees a naturopathic doctor and would prefer her mother choose all natural remedies to control cholesterol. Patient refuses to try any statins because they caused muscle pain.   ? ?Has a significant family history. Both mother and father had early CAD. Patient smoked for 50 years. ? ?Current Medications: Zetia '10mg'$  daily ? ?Intolerances:  ?Rosuvastatin? ?Atorvastatin? ? ?Risk Factors: ?CAD ?Hx of Smoking ?Hx of CABG ?Family History ? ?LDL goal: <70 ? ?Labs:TC 200, Trigs 93, HDL 52, LDL 129 (08/06/21 - not on any medication) ? ?Past Medical History:  ?Diagnosis Date  ? Asthma 05/2015  ? CAD (coronary artery disease) of bypass graft VG to RCA 08/06/2021  ? Chest pain 08/06/2014  ? Chest pain  ? Closed fracture of greater tuberosity of humerus 07/21/2018  ? COPD (chronic obstructive pulmonary disease) ([place])   ? Family history of heart disease   ? Former smoker 07/27/2019  ? H/O: hysterectomy 11/29/2019  ? Healthcare maintenance 07/27/2019  ? History of chest pain 06/24/2020  ? History of cholecystectomy 07/17/2020  ? History of hiatal hernia   ? 'small"  ? History of kidney stones   ? History of total abdominal hysterectomy 07/17/2020  ? Hx of adenomatous colonic polyps 04/23/2019  ? Melanoma ([place])   ? Melanoma of chest wall  ? Myocardial infarction [facility])   ? "mild"  years ago  ?  Osteoporosis 06/24/2020  ? Pain of left hip joint 10/24/2020  ? Pure hypercholesterolemia 06/24/2020  ? S/P PTCA (percutaneous transluminal coronary angioplasty) prox RCA 08/06/2021  ? Shortness of breath dyspnea   ? just recently- sob walking up stairs- "I think its pollen.  ? Shoulder pain 04/12/2018  ? Solitary pulmonary nodule 03/12/2015  ? -noted 2009 CT chest 05/2016 >Small pulmonary nodules in both lungs are unchanged from previous exam. The largest is in the posterior left lower lobe measuring 67m.  ? Stiffness of right shoulder joint 06/27/2018  ? Trigger thumb of left hand 12/29/2017  ? Vitamin D deficiency 06/24/2020  ? ? ?Current Outpatient Medications on File Prior to Visit  ?Medication Sig Dispense Refill  ? albuterol (PROAIR HFA) 108 (90 Base) MCG/ACT inhaler Inhale 2 puffs into the lungs every 6 (six) hours as needed for wheezing or shortness of breath. 56 g 3  ? amLODipine (NORVASC) 2.5 MG tablet Take 1 tablet (2.5 mg total) by mouth daily. 30 tablet 6  ? aspirin 81 MG tablet Take 81 mg by mouth at bedtime.    ? calcium carbonate (TUMS - DOSED IN MG ELEMENTAL CALCIUM) 500 MG chewable tablet Chew 1 tablet by mouth daily as needed for indigestion or heartburn.    ? Cholecalciferol (VITAMIN  D-3) 1000 UNITS CAPS Take 1,000 Units by mouth daily.     ? Dextrose-Fructose-Sod Citrate 215 [id number] MG CHEW Chew 2 tablets by mouth as needed (nausea).     ? Elderberry 575 MG/5ML SYRP Take 15 mLs by mouth daily.    ? ezetimibe (ZETIA) 10 MG tablet Take 1 tablet (10 mg total) by mouth daily. 30 tablet 6  ? Fluticasone-Umeclidin-Vilant (TRELEGY ELLIPTA) 100-62.5-25 MCG/ACT AEPB Inhale 1 puff into the lungs daily. 28 each 3  ? Fluticasone-Umeclidin-Vilant (TRELEGY ELLIPTA) 100-62.5-25 MCG/INH AEPB Inhale 1 puff into the lungs daily. 180 each 3  ? Magnesium 250 MG TABS Take 250 mg by mouth 2 (two) times daily.    ? metoprolol tartrate (LOPRESSOR) 25 MG tablet Take 0.5 tablets (12.5 mg total) by mouth 2 (two) times daily.  30 tablet 6  ? nitroGLYCERIN (NITROSTAT) 0.4 MG SL tablet Place 1 tablet (0.4 mg total) under the tongue every 5 (five) minutes x 3 doses as needed for chest pain. 25 tablet 4  ? OIL OF OREGANO PO Take 1 capsule by mouth daily.     ? OVER THE COUNTER MEDICATION Place 6 drops under the tongue 2 (two) times daily. CBD oil    ? ticagrelor (BRILINTA) 90 MG TABS tablet Take 1 tablet (90 mg total) by mouth 2 (two) times daily. 60 tablet 11  ? TURMERIC PO Take 1 capsule by mouth daily.     ? ?No current facility-administered medications on file prior to visit.  ? ? ?Allergies  ?Allergen Reactions  ? Codeine Nausea And Vomiting  ? Ibandronic Acid   ?  Other reaction(s): Headache  ? Statins Other (See Comments)  ?  Patient refuses> practices holistic lifestyle  ? ? ?Assessment/Plan: ? ?1. Hyperlipidemia - Patient LDL 129 while in the hospital which is above goal of <70. Has started Zetia but that is unlikely to get her to goal. She also would prefer not to even take the Zetia. Recommended she start on a statin and explained the benefits of statin therapy but patient steadfastly refuses.  Daughter prefers to try "natural remedies." Explained that healthy diet choices such as the Mediterranean diet can help lower cholesterol, however this was not likely to get her to goal. Recommended patient start PCSK9i. Explaiend mechanism of action, administration, and possible adverse effects however patient is also resistant. Advised one other option would be Nexletol/Nexlizet however she may not reach goal on that therapy alone.  Patient and daughter plan to go home research options.  Advised to call or message if she was willing to start any of these options. ? ?Continue Zetia '10mg'$  daily ?Recheck as needed ? ?Karren Cobble, PharmD, BCACP, [place], CPP ?[address], [address] ?[place], [place], [address] ?Phone: [phone], Fax: [phone]  ?  ?

## 2021-09-23 NOTE — Patient Instructions (Signed)
It was nice meeting you two today! ? ?We would like your LDL (bad cholesterol) to be less than 70 ? ?The medication we recommend is called Repatha. You would inject one pen every 2 weeks.  Another option would be Nexlizet. ? ?If you choose to start one of these medications, please can [place] or Dr Agustin Cree and we can place an order for you ? ?I recommend continuing the Mediterranean diet and being as physically active as possible ? ?Please call us with any questions! ? ?Karren Cobble, PharmD, BCACP, [place], CPP ?[address], [address] ?[place], [place], [address] ?Phone: [phone], Fax: [phone]  ? ?

## 2021-09-28 ENCOUNTER — Inpatient Hospital Stay: Admission: RE | Admit: 2021-09-28 | Payer: Medicare PPO | Source: Ambulatory Visit

## 2021-09-28 ENCOUNTER — Telehealth: Payer: Self-pay | Admitting: Pulmonary Disease

## 2021-09-28 MED ORDER — TRELEGY ELLIPTA 100-62.5-25 MCG/ACT IN AEPB: 1.0000 | INHALATION_SPRAY | Freq: Every day | RESPIRATORY_TRACT | 3 refills | Status: DC

## 2021-09-28 NOTE — Telephone Encounter (Signed)
Spoke with pt and verified that she needed 90 day [facility] [address] [facility] [place]. [facility]ill of Trelegy in [facility]. Nothing further needed at this time.  ?

## 2021-09-29 ENCOUNTER — Other Ambulatory Visit: Payer: Self-pay

## 2021-09-29 ENCOUNTER — Ambulatory Visit (INDEPENDENT_AMBULATORY_CARE_PROVIDER_SITE_OTHER)
Admission: RE | Admit: 2021-09-29 | Discharge: 2021-09-29 | Disposition: A | Discharge status: Home / Self Care | Payer: Medicare PPO | Source: Ambulatory Visit | Attending: Acute Care | Admitting: Acute Care

## 2021-09-29 DIAGNOSIS — J439 Emphysema, unspecified: Secondary | ICD-10-CM | POA: Diagnosis not present

## 2021-09-29 DIAGNOSIS — Z87891 Personal history of nicotine dependence: Secondary | ICD-10-CM | POA: Diagnosis not present

## 2021-09-29 DIAGNOSIS — R911 Solitary pulmonary nodule: Secondary | ICD-10-CM | POA: Diagnosis not present

## 2021-10-01 ENCOUNTER — Other Ambulatory Visit: Payer: Self-pay | Admitting: Cardiology

## 2021-10-01 ENCOUNTER — Telehealth: Payer: Self-pay | Admitting: Acute Care

## 2021-10-01 ENCOUNTER — Other Ambulatory Visit: Payer: Self-pay

## 2021-10-01 ENCOUNTER — Ambulatory Visit: Payer: Medicare PPO | Admitting: Pulmonary Disease

## 2021-10-01 ENCOUNTER — Encounter: Payer: Self-pay | Admitting: Pulmonary Disease

## 2021-10-01 DIAGNOSIS — R911 Solitary pulmonary nodule: Secondary | ICD-10-CM

## 2021-10-01 DIAGNOSIS — J432 Centrilobular emphysema: Secondary | ICD-10-CM | POA: Diagnosis not present

## 2021-10-01 NOTE — Progress Notes (Signed)
? ?  Subjective:  ? ? Patient ID: Sarah Pena, female    DOB: 09/19/1940, 81 y.o.   MRN: [id number] ? ?HPI ?81 yo ex- smoker For follow-up of moderate COPD ?From [place] ?  ?She quit smoking in 2016 , husband quit smoking December 2018 ?PMH - CABG 08/2017 ? ? ?Chief Complaint  ?Patient presents with  ? Follow-up  ?  6 month follow up. Pt states she is short winded at times. Pt states she gets wore out very easily when walking   ? ? ?Seen 09/07/21 - treated for COPD flare with prednisone and antibiotic ?She developed chest pain and underwent evaluation including cardiac cath which showed occluded graft to RCA, she underwent PTCA to native RCA and since then was started on Brilinta, metoprolol and cholesterol medications.  I have reviewed cardiology consultation ?She reports increased fatigue and tiredness since being started on these medications.  She is unable to participate in rehab. ?She continues on Trelegy. ?Denies wheezing or nocturnal symptoms ? ?Concerned about her low-dose CT chest results that we reviewed that showed right lower lobe new 8 mm nodule not present on previous CT ? ?Significant tests/ events reviewed ? ?02/2015 Spirometry - severe airway obstruction - FEV1 0.9-52%, FVC 1.4-59%, ratio of 61 ?Spirometry 07/2016-improved FEV1 at 62% ?  ?CT angiogram 03/2008 -showed 8 mm lesion in the right apex ?CT chest 03/2015 >>Less than 3 mm stable bilateral upper lobe ground-glass nodules ?  ?CT chest 05/2016 - unchanged nodules ?  ?LDCT 07/2019 stable multiple nodules, RADS 2 ? ?LDCT 09/2021 ?  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 ? ?Review of Systems ?neg for any significant sore throat, dysphagia, itching, sneezing, nasal congestion or excess/ purulent secretions, fever, chills, sweats, unintended wt loss, pleuritic or exertional cp, hempoptysis, orthopnea pnd or change in chronic leg swelling. Also denies presyncope, palpitations, heartburn, abdominal pain, nausea, vomiting, diarrhea or change in bowel or  urinary habits, dysuria,hematuria, rash, arthralgias, visual complaints, headache, numbness weakness or ataxia. ? ?   ?Objective:  ? Physical Exam ? ? ?Gen. Pleasant, elderly, well-nourished, in no distress ?ENT - no thrush, no pallor/icterus,no post nasal drip ?Neck: No JVD, no thyromegaly, no carotid bruits ?Lungs: no use of accessory muscles, no dullness to percussion, clear without rales or rhonchi  ?Cardiovascular: Rhythm regular, heart sounds  normal, no murmurs or gallops, no peripheral edema ?Musculoskeletal: No deformities, no cyanosis or clubbing  ? ? ? ? ?   ?Assessment & Plan:  ? ? ?

## 2021-10-01 NOTE — Telephone Encounter (Signed)
[facility] Radiology for the call report. It was for her lung cancer screening CT that was done on 09/29/21. Radiologist is concerned about impression #1.  ? ?IMPRESSION: ?1. New solid nodule of the right lower lobe measuring 7.4 mm. ?Lung-RADS 4A, suspicious. Follow up low-dose chest CT without ?contrast in 3 months (please use the following order, CT CHEST LCS ?NODULE FOLLOW-UP W/O CM) is recommended. Alternatively, PET may be ?considered when there is a solid component 81m or larger. ?2. Aortic Atherosclerosis (ICD10-I70.0) and Emphysema (ICD10-J43.9). ? ?SJudson Roch can you please advise? Thanks!  ?

## 2021-10-01 NOTE — Patient Instructions (Signed)
? ?  X CT chest Wo con in  60month ? ? ?Continue on trelegy ?

## 2021-10-01 NOTE — Telephone Encounter (Signed)
See telephone note.

## 2021-10-01 NOTE — Assessment & Plan Note (Addendum)
Continue Trelegy. ?Recent  Fatigue seems to be clearly related to cardiac meds started in January and she has an ongoing discussion with her cardiologist about this ?Certainly some level of deconditioning is possible given her recent hospitalization and COPD flare.  She does not want to access the pulmonary rehab center in [place] ?

## 2021-10-01 NOTE — Assessment & Plan Note (Addendum)
New nodule noted in right lower lobe, 8 mm in March 2023. ?I have reviewed images ?54-monthfollow-up CT chest without contrast will be scheduled , intermediate risk for malignancy ?

## 2021-10-01 NOTE — Telephone Encounter (Signed)
I have called the patient with the results of her low dose CT Chest. Her scan was read as a Lung RADS 4 A : suspicious findings, either short term follow up in 3 months or alternatively  PET Scan evaluation may be considered when there is a solid component of  8 mm or larger. There is a new 7.4 mm nodule in the RLL that will need a 3 month follow up. Patient was sick with an upper respiratory infection in January and February.  ?She is in agreement with a 3 month follow up LDCT.  ?Adding Dr. Shelia Media and Elsworth Soho to message to share results and plan. ?Langley Gauss, I will order the 3 month follow up scan.   ?

## 2021-10-19 ENCOUNTER — Ambulatory Visit: Payer: Medicare PPO | Admitting: Podiatry

## 2021-10-19 ENCOUNTER — Ambulatory Visit (INDEPENDENT_AMBULATORY_CARE_PROVIDER_SITE_OTHER): Payer: Medicare PPO

## 2021-10-19 ENCOUNTER — Encounter: Payer: Self-pay | Admitting: Podiatry

## 2021-10-19 DIAGNOSIS — M79671 Pain in right foot: Secondary | ICD-10-CM

## 2021-10-19 DIAGNOSIS — M722 Plantar fascial fibromatosis: Secondary | ICD-10-CM

## 2021-10-19 MED ORDER — DEXAMETHASONE SODIUM PHOSPHATE 120 MG/30ML IJ SOLN
4.0000 mg | Freq: Once | INTRAMUSCULAR | Status: AC
  Administered 2021-10-19: 4 mg via INTRA_ARTICULAR

## 2021-10-19 NOTE — Patient Instructions (Signed)

## 2021-10-19 NOTE — Progress Notes (Signed)
?Subjective:  ?Patient ID: Sarah Pena, female    DOB: 1940-09-28,   MRN: [id number] ? ?Chief Complaint  ?Patient presents with  ? Plantar Fasciitis  ?  Right heel is hurting and has been for the last 3 weeks and I can not walk on it and I did go and get some over the counter inserts and they have helped  ? ? ?81 y.o. female presents for concern of right foot pain that has been going on for the past few weeks. Relates pain with first steps in the morning or after sitting. Has been seen in th epast and given injections that have lasted 1-3 years. Relates she has also tried inserts that are helpful.  . Denies any other pedal complaints. Denies n/v/f/c.  ? ?Past Medical History:  ?Diagnosis Date  ? Asthma 05/2015  ? CAD (coronary artery disease) of bypass graft VG to RCA 08/06/2021  ? Chest pain 08/06/2014  ? Chest pain  ? Closed fracture of greater tuberosity of humerus 07/21/2018  ? COPD (chronic obstructive pulmonary disease) ([place])   ? Family history of heart disease   ? Former smoker 07/27/2019  ? H/O: hysterectomy 11/29/2019  ? Healthcare maintenance 07/27/2019  ? History of chest pain 06/24/2020  ? History of cholecystectomy 07/17/2020  ? History of hiatal hernia   ? 'small"  ? History of kidney stones   ? History of total abdominal hysterectomy 07/17/2020  ? Hx of adenomatous colonic polyps 04/23/2019  ? Melanoma ([place])   ? Melanoma of chest wall  ? Myocardial infarction [facility])   ? "mild"  years ago  ? Osteoporosis 06/24/2020  ? Pain of left hip joint 10/24/2020  ? Pure hypercholesterolemia 06/24/2020  ? S/P PTCA (percutaneous transluminal coronary angioplasty) prox RCA 08/06/2021  ? Shortness of breath dyspnea   ? just recently- sob walking up stairs- "I think its pollen.  ? Shoulder pain 04/12/2018  ? Solitary pulmonary nodule 03/12/2015  ? -noted 2009 CT chest 05/2016 >Small pulmonary nodules in both lungs are unchanged from previous exam. The largest is in the posterior left lower lobe measuring 6m.  ? Stiffness of right  shoulder joint 06/27/2018  ? Trigger thumb of left hand 12/29/2017  ? Vitamin D deficiency 06/24/2020  ? ? ?Objective:  ?Physical Exam: ?Vascular: DP/PT pulses 2/4 bilateral. CFT <3 seconds. Normal hair growth on digits. No edema.  ?Skin. No lacerations or abrasions bilateral feet.  ?Musculoskeletal: MMT 5/5 bilateral lower extremities in DF, PF, Inversion and Eversion. Deceased ROM in DF of ankle joint. Tender to medial calcaneal tubercle on right foot. No pain to achilles or with calcaneal squeeze. No pain along arch or PT tendon.  ?Neurological: Sensation intact to light touch.  ? ?Assessment:  ? ?1. Plantar fasciitis   ?2. Pain of right heel   ? ? ? ?Plan:  ?Patient was evaluated and treated and all questions answered. ?Discussed plantar fasciitis with patient.  ?X-rays reviewed and discussed with patient. No acute fractures or dislocations noted. Mild spurring noted at inferior calcaneus.  ?Discussed treatment options including, ice, NSAIDS, supportive shoes, bracing, and stretching. Stretching exercises provided to be done on a daily basis.   ?Patient requesting injection today. Procedure note below.   ?Follow-up as needed if pain returns.  ? ?Procedure:  ?Discussed etiology, pathology, conservative vs. surgical therapies. At this time a plantar fascial injection was recommended.  The patient agreed and a sterile skin prep was applied.  An injection consisting of  dexamethasone and marcaine  mixture was infiltrated at the point of maximal tenderness on the right Heel.  Bandaid applied. The patient tolerated this well and was given instructions for aftercare.  ? ? ?Lorenda Peck, DPM  ? ? ?

## 2021-10-20 ENCOUNTER — Telehealth: Payer: Self-pay | Admitting: Pulmonary Disease

## 2021-10-20 MED ORDER — ALBUTEROL SULFATE HFA 108 (90 BASE) MCG/ACT IN AERS: 2.0000 | INHALATION_SPRAY | Freq: Four times a day (QID) | RESPIRATORY_TRACT | 3 refills | Status: AC | PRN

## 2021-10-20 NOTE — Telephone Encounter (Signed)
I called the patient and let her know a refill was sent to the pharmacy. She did not have any questions. ?

## 2021-11-11 ENCOUNTER — Ambulatory Visit: Payer: Medicare PPO | Admitting: Cardiology

## 2021-12-01 ENCOUNTER — Telehealth: Payer: Self-pay | Admitting: Acute Care

## 2021-12-01 NOTE — Telephone Encounter (Signed)
Returned call to patient regarding follow up scan due in June for LCS nodule follow up.  Patient states she received a 'message' that she had abnormal scan in March and needed a follow up.  She said she was upset that 'she didn't know anything about it.'  Read the note to patient that was documented by S. Groce NP in which the patient was called with results of the LDCT and also notified her of the need for 3 month follow up scan.  Explained to patient that the recommendation was for the 3 month follow up as per radiology and our NP had also tagged Dr. Shelia Media and Dr. Elsworth Soho on the note to keep them informed of the plan.  Patient acknowledged she understood and agreed to schedule the follow up CT appt while on the phone today.  Appt for CT follow up scheduled and patient was appreciative of the call.

## 2021-12-26 ENCOUNTER — Other Ambulatory Visit: Payer: Self-pay | Admitting: Cardiology

## 2021-12-29 ENCOUNTER — Ambulatory Visit: Payer: Medicare PPO | Admitting: Cardiology

## 2021-12-29 ENCOUNTER — Encounter: Payer: Self-pay | Admitting: Cardiology

## 2021-12-29 VITALS — BP 140/82 | HR 73 | Ht 59.0 in | Wt 117.2 lb

## 2021-12-29 DIAGNOSIS — I10 Essential (primary) hypertension: Secondary | ICD-10-CM

## 2021-12-29 DIAGNOSIS — I2510 Atherosclerotic heart disease of native coronary artery without angina pectoris: Secondary | ICD-10-CM

## 2021-12-29 DIAGNOSIS — Z9861 Coronary angioplasty status: Secondary | ICD-10-CM

## 2021-12-29 DIAGNOSIS — I252 Old myocardial infarction: Secondary | ICD-10-CM | POA: Diagnosis not present

## 2021-12-29 DIAGNOSIS — J432 Centrilobular emphysema: Secondary | ICD-10-CM | POA: Diagnosis not present

## 2021-12-29 DIAGNOSIS — E7800 Pure hypercholesterolemia, unspecified: Secondary | ICD-10-CM

## 2021-12-29 NOTE — Progress Notes (Signed)
Cardiology Office Note:    Date:  12/29/2021   ID:  Sarah Pena, DOB Dec 29, 1940, MRN [id number]  PCP:  Deland Pretty, MD  Cardiologist:  Jenne Campus, MD    Referring MD: Deland Pretty, MD   Chief Complaint  Patient presents with   Follow-up  Doing fine  History of Present Illness:    Sarah Pena is a 81 y.o. female with past medical history significant for coronary artery disease in 2019 she had coronary bypass graft x3 with LIMA to LAD SVG to obtuse marginal branch SVG to PDA also past medical history significant for essential hypertension, history of tobacco abuse, dyslipidemia, COPD.  In January of this year she ended up going to the hospital because of chest pain.  She was find to have complete occluded graft going to RCA however we tried to perform PTCA and stenting of the right coronary artery which was very difficult we able to reduce stenosis from 90% to about 75% which resulted in improvement in the flow to TIMI III.  Since that time she seems to be doing well.  She denies have any chest pain tightness squeezing pressure burning chest.  We struggling also with cholesterol-lowering medication she is unable to tolerate anything she was referred to our lipid clinic had a long discussion conversation with pharmacist but elected not to pursue PCSK9 agents she is afraid that when she will get injection she will get sick may be weeks before she starts feeling better.  Past Medical History:  Diagnosis Date   Asthma 05/2015   CAD (coronary artery disease) of bypass graft VG to RCA 08/06/2021   Chest pain 08/06/2014   Chest pain   Closed fracture of greater tuberosity of humerus 07/21/2018   COPD (chronic obstructive pulmonary disease) ([place])    Family history of heart disease    Former smoker 07/27/2019   H/O: hysterectomy 11/29/2019   Healthcare maintenance 07/27/2019   History of chest pain 06/24/2020   History of cholecystectomy 07/17/2020   History of hiatal hernia    'small"    History of kidney stones    History of total abdominal hysterectomy 07/17/2020   Hx of adenomatous colonic polyps 04/23/2019   Melanoma ([place])    Melanoma of chest wall   Myocardial infarction ([place])    "mild"  years ago   Osteoporosis 06/24/2020   Pain of left hip joint 10/24/2020   Pure hypercholesterolemia 06/24/2020   S/P PTCA (percutaneous transluminal coronary angioplasty) prox RCA 08/06/2021   Shortness of breath dyspnea    just recently- sob walking up stairs- "I think its pollen.   Shoulder pain 04/12/2018   Solitary pulmonary nodule 03/12/2015   -noted 2009 CT chest 05/2016 >Small pulmonary nodules in both lungs are unchanged from previous exam. The largest is in the posterior left lower lobe measuring 34m.   Stiffness of right shoulder joint 06/27/2018   Trigger thumb of left hand 12/29/2017   Vitamin D deficiency 06/24/2020    Past Surgical History:  Procedure Laterality Date   APPENDECTOMY     CHOLECYSTECTOMY N/A 10/29/2014   Procedure: LAPAROSCOPIC CHOLECYSTECTOMY WITH INTRAOPERATIVE CHOLANGIOGRAM;  Surgeon: TJackolyn Confer MD;  Location[facility]  Service: General;  Laterality: N/A;   COLONOSCOPY     CORONARY ARTERY BYPASS GRAFT N/A 08/18/2017   Procedure: CORONARY ARTERY BYPASS GRAFTING (CABG) x 3, USING LEFT INTERNAL MAMMARY ARTERY AND RIGHT GREATER SAPHENOUS VEIN HARVESTED ENDOSCOPICALLY;  Surgeon: HMelrose Nakayama MD;  Location: [facility]  Service:  Open Heart Surgery;  Laterality: N/A;   CORONARY BALLOON ANGIOPLASTY N/A 08/05/2021   Procedure: CORONARY BALLOON ANGIOPLASTY;  Surgeon: Troy Sine, MD;  Location: [facility];  Service: Cardiovascular;  Laterality: N/A;   EYE SURGERY Bilateral    catarct with lens replacement   HERNIA REPAIR     RIH   LEFT HEART CATH AND CORONARY ANGIOGRAPHY N/A 08/18/2017   Procedure: LEFT HEART CATH AND CORONARY ANGIOGRAPHY;  Surgeon: [name], MD;  Location: [facility];  Service: Cardiovascular;  Laterality: N/A;    LEFT HEART CATH AND CORS/GRAFTS ANGIOGRAPHY N/A 08/05/2021   Procedure: LEFT HEART CATH AND CORS/GRAFTS ANGIOGRAPHY;  Surgeon: Troy Sine, MD;  Location: [facility];  Service: Cardiovascular;  Laterality: N/A;   lipoma removed  1993   RLQ area   MELANOMA EXCISION     TEE WITHOUT CARDIOVERSION N/A 08/18/2017   Procedure: TRANSESOPHAGEAL ECHOCARDIOGRAM (TEE);  Surgeon: Melrose Nakayama, MD;  Location: [facility];  Service: Open Heart Surgery;  Laterality: N/A;   TUBAL LIGATION      Current Medications: Current Meds  Medication Sig   albuterol (PROAIR HFA) 108 (90 Base) MCG/ACT inhaler Inhale 2 puffs into the lungs every 6 (six) hours as needed for wheezing or shortness of breath.   amLODipine (NORVASC) 2.5 MG tablet TAKE 1 TABLET BY MOUTH EVERY DAY (Patient taking differently: Take 2.5 mg by mouth daily.)   aspirin 81 MG tablet Take 81 mg by mouth at bedtime.   calcium carbonate (TUMS - DOSED IN MG ELEMENTAL CALCIUM) 500 MG chewable tablet Chew 1 tablet by mouth daily as needed for indigestion or heartburn.   Cholecalciferol (VITAMIN D-3) 1000 UNITS CAPS Take 1,000 Units by mouth daily.    Dextrose-Fructose-Sod Citrate [phone] MG CHEW Chew 2 tablets by mouth as needed (nausea).    Elderberry 575 MG/5ML SYRP Take 15 mLs by mouth daily.   ezetimibe (ZETIA) 10 MG tablet TAKE 1 TABLET BY MOUTH EVERY DAY (Patient taking differently: Take 10 mg by mouth daily.)   Fluticasone-Umeclidin-Vilant (TRELEGY ELLIPTA) 100-62.5-25 MCG/ACT AEPB Inhale 1 puff into the lungs daily.   Magnesium 250 MG TABS Take 250 mg by mouth 2 (two) times daily.   metoprolol tartrate (LOPRESSOR) 25 MG tablet TAKE 1 TABLET BY MOUTH TWICE A DAY (Patient taking differently: Take 25 mg by mouth 2 (two) times daily.)   nitroGLYCERIN (NITROSTAT) 0.4 MG SL tablet Place 1 tablet (0.4 mg total) under the tongue every 5 (five) minutes x 3 doses as needed for chest pain.   OIL OF OREGANO PO Take 1 capsule by mouth daily.     OVER THE COUNTER MEDICATION Place 6 drops under the tongue 2 (two) times daily. CBD oil   ticagrelor (BRILINTA) 90 MG TABS tablet Take 1 tablet (90 mg total) by mouth 2 (two) times daily.   TURMERIC PO Take 1 capsule by mouth daily.      Allergies:   Codeine, Crestor [rosuvastatin], Ibandronic acid, Lipitor [atorvastatin], and Statins   Social History   Socioeconomic History   Marital status: Married    Spouse name: Not on file   Number of children: Not on file   Years of education: Not on file   Highest education level: Not on file  Occupational History   Not on file  Tobacco Use   Smoking status: Former    Packs/day: 1.00    Years: 50.00    Total pack years: 50.00  Types: Cigarettes    Quit date: 05/22/2014    Years since quitting: 7.6   Smokeless tobacco: Never   Tobacco comments:    quit 05/2014  Vaping Use   Vaping Use: Never used  Substance and Sexual Activity   Alcohol use: Yes    Alcohol/week: 0.0 standard drinks of alcohol    Comment: rarely   Drug use: No   Sexual activity: Not on file  Other Topics Concern   Not on file  Social History Narrative   Not on file   Social Determinants of Health   Financial Resource Strain: Not on file  Food Insecurity: Not on file  Transportation Needs: Not on file  Physical Activity: Not on file  Stress: Not on file  Social Connections: Not on file     Family History: The patient's family history includes Cancer in her maternal grandfather; Heart disease in her father and mother. There is no history of Colon polyps, Colon cancer, Esophageal cancer, Stomach cancer, or Rectal cancer. ROS:   Please see the history of present illness.    All 14 point review of systems negative except as described per history of present illness  EKGs/Labs/Other Studies Reviewed:      Recent Labs: 08/05/2021: ALT 51 08/06/2021: BUN 11; Creatinine, Ser 0.74; Hemoglobin 12.6; Platelets 213; Potassium 3.6; Sodium 139  Recent Lipid  Panel    Component Value Date/Time   CHOL 200 08/06/2021 0553   CHOL 220 (H) 07/31/2019 0848   TRIG 93 08/06/2021 0553   HDL 52 08/06/2021 0553   HDL 70 07/31/2019 0848   CHOLHDL 3.8 08/06/2021 0553   VLDL 19 08/06/2021 0553   LDLCALC 129 (H) 08/06/2021 0553   LDLCALC 136 (H) 07/31/2019 0848    Physical Exam:    VS:  BP 140/82 (BP Location: Left Arm, Patient Position: Sitting)   Pulse 73   Ht '4\' 11"'$  (1.499 m)   Wt 117 lb 3.2 oz (53.2 kg)   SpO2 94%   BMI 23.67 kg/m     Wt Readings from Last 3 Encounters:  12/29/21 117 lb 3.2 oz (53.2 kg)  10/01/21 114 lb (51.7 kg)  09/23/21 108 lb 6.4 oz (49.2 kg)     GEN:  Well nourished, well developed in no acute distress HEENT: Normal NECK: No JVD; No carotid bruits LYMPHATICS: No lymphadenopathy CARDIAC: RRR, no murmurs, no rubs, no gallops RESPIRATORY:  Clear to auscultation without rales, wheezing or rhonchi  ABDOMEN: Soft, non-tender, non-distended MUSCULOSKELETAL:  No edema; No deformity  SKIN: Warm and dry LOWER EXTREMITIES: no swelling NEUROLOGIC:  Alert and oriented x 3 PSYCHIATRIC:  Normal affect   ASSESSMENT:    1. Coronary artery disease with history of myocardial infarction without history of CABG   2. Centrilobular emphysema ([place])   3. S/P PTCA (percutaneous transluminal coronary angioplasty) prox RCA 08/05/21   4. Primary hypertension    PLAN:    In order of problems listed above:  Coronary disease stable from that point review she complain of having bruising everywhere.  She will continue Brilinta for 6 months and after that we will be able to discontinue which should help with the bruising. Emphysema that being followed by pulmonary.  Doing well from that point review however she was find to have some nodule and she does have schedule CT of her chest done. Dyslipidemia huge problem she refused to take any injectable medications we try everything possible orally and nothing seems to be working she is on [employer]  today we will check direct LDL to see how effective that medication is. Essential hypertension blood pressure well controlled continue present management   Medication Adjustments/Labs and Tests Ordered: Current medicines are reviewed at length with the patient today.  Concerns regarding medicines are outlined above.  No orders of the defined types were placed in this encounter.  Medication changes: No orders of the defined types were placed in this encounter.   Signed, Park Liter, MD, [facility] 12/29/2021 3:19 PM    [place]

## 2021-12-29 NOTE — Patient Instructions (Signed)
Medication Instructions:  Your physician recommends that you continue on your current medications as directed. Please refer to the Current Medication list given to you today.  *If you need a refill on your cardiac medications before your next appointment, please call your pharmacy*   Lab Work: Your physician recommends that you return for lab work in: Today for Direct LDL  If you have labs (blood work) drawn today and your tests are completely normal, you will receive your results only by: MyChart Message (if you have MyChart) OR A paper copy in the mail If you have any lab test that is abnormal or we need to change your treatment, we will call you to review the results.   Testing/Procedures: NONE   Follow-Up: At [facility], you and your health needs are our priority.  As part of our continuing mission to provide you with exceptional heart care, we have created designated Provider Care Teams.  These Care Teams include your primary Cardiologist (physician) and Advanced Practice Providers (APPs -  Physician Assistants and Nurse Practitioners) who all work together to provide you with the care you need, when you need it.  We recommend signing up for the patient portal called "MyChart".  Sign up information is provided on this After Visit Summary.  MyChart is used to connect with patients for Virtual Visits (Telemedicine).  Patients are able to view lab/test results, encounter notes, upcoming appointments, etc.  Non-urgent messages can be sent to your provider as well.   To learn more about what you can do with MyChart, go to [email or web address].    Your next appointment:   6 month(s)  The format for your next appointment:   In Person  Provider:   Jenne Campus, MD    Other Instructions   Important Information About Sugar

## 2021-12-29 NOTE — Addendum Note (Signed)
Addended by: Anselm Pancoast on: 12/29/2021 03:32 PM   Modules accepted: Orders

## 2021-12-30 LAB — LDL CHOLESTEROL, DIRECT: LDL Direct: 107 mg/dL — ABNORMAL HIGH (ref 0–99)

## 2021-12-31 ENCOUNTER — Telehealth: Payer: Self-pay | Admitting: Cardiology

## 2021-12-31 NOTE — Telephone Encounter (Signed)
Patient informed of result.

## 2021-12-31 NOTE — Telephone Encounter (Signed)
Pt returning a call about lab results from [place], [place].

## 2022-01-01 ENCOUNTER — Ambulatory Visit (INDEPENDENT_AMBULATORY_CARE_PROVIDER_SITE_OTHER)
Admission: RE | Admit: 2022-01-01 | Discharge: 2022-01-01 | Disposition: A | Discharge status: Home / Self Care | Payer: Medicare PPO | Source: Ambulatory Visit | Attending: Acute Care | Admitting: Acute Care

## 2022-01-01 ENCOUNTER — Ambulatory Visit: Payer: Medicare PPO

## 2022-01-01 DIAGNOSIS — J984 Other disorders of lung: Secondary | ICD-10-CM | POA: Diagnosis not present

## 2022-01-01 DIAGNOSIS — R911 Solitary pulmonary nodule: Secondary | ICD-10-CM

## 2022-01-01 DIAGNOSIS — Z87891 Personal history of nicotine dependence: Secondary | ICD-10-CM | POA: Diagnosis not present

## 2022-01-01 DIAGNOSIS — J439 Emphysema, unspecified: Secondary | ICD-10-CM | POA: Diagnosis not present

## 2022-01-05 ENCOUNTER — Telehealth: Payer: Self-pay | Admitting: Acute Care

## 2022-01-05 ENCOUNTER — Other Ambulatory Visit: Payer: Self-pay

## 2022-01-05 DIAGNOSIS — Z122 Encounter for screening for malignant neoplasm of respiratory organs: Secondary | ICD-10-CM

## 2022-01-05 DIAGNOSIS — D485 Neoplasm of uncertain behavior of skin: Secondary | ICD-10-CM | POA: Diagnosis not present

## 2022-01-05 DIAGNOSIS — Z87891 Personal history of nicotine dependence: Secondary | ICD-10-CM

## 2022-01-05 DIAGNOSIS — L570 Actinic keratosis: Secondary | ICD-10-CM | POA: Diagnosis not present

## 2022-01-05 NOTE — Telephone Encounter (Signed)
Left VM for patient to call if she has questions regarding LDCT results.  Unable to reach her today by phone but advised via message (per DPR) that letter has been sent through [facility] and to call if she wants to discuss results further.

## 2022-01-19 ENCOUNTER — Ambulatory Visit: Payer: Medicare PPO | Admitting: Pulmonary Disease

## 2022-02-16 DIAGNOSIS — R300 Dysuria: Secondary | ICD-10-CM | POA: Diagnosis not present

## 2022-02-16 DIAGNOSIS — N3091 Cystitis, unspecified with hematuria: Secondary | ICD-10-CM | POA: Diagnosis not present

## 2022-02-18 DIAGNOSIS — R300 Dysuria: Secondary | ICD-10-CM | POA: Diagnosis not present

## 2022-03-04 DIAGNOSIS — K529 Noninfective gastroenteritis and colitis, unspecified: Secondary | ICD-10-CM | POA: Diagnosis not present

## 2022-03-04 DIAGNOSIS — J984 Other disorders of lung: Secondary | ICD-10-CM | POA: Diagnosis not present

## 2022-03-04 DIAGNOSIS — R590 Localized enlarged lymph nodes: Secondary | ICD-10-CM | POA: Diagnosis not present

## 2022-03-04 DIAGNOSIS — R509 Fever, unspecified: Secondary | ICD-10-CM | POA: Diagnosis not present

## 2022-03-04 DIAGNOSIS — K449 Diaphragmatic hernia without obstruction or gangrene: Secondary | ICD-10-CM | POA: Diagnosis not present

## 2022-03-04 DIAGNOSIS — Z20822 Contact with and (suspected) exposure to covid-19: Secondary | ICD-10-CM | POA: Diagnosis not present

## 2022-03-04 DIAGNOSIS — Z792 Long term (current) use of antibiotics: Secondary | ICD-10-CM | POA: Diagnosis not present

## 2022-03-04 DIAGNOSIS — Z79899 Other long term (current) drug therapy: Secondary | ICD-10-CM | POA: Diagnosis not present

## 2022-03-04 DIAGNOSIS — Z951 Presence of aortocoronary bypass graft: Secondary | ICD-10-CM | POA: Diagnosis not present

## 2022-03-04 DIAGNOSIS — Z87891 Personal history of nicotine dependence: Secondary | ICD-10-CM | POA: Diagnosis not present

## 2022-03-04 DIAGNOSIS — Z8701 Personal history of pneumonia (recurrent): Secondary | ICD-10-CM | POA: Diagnosis not present

## 2022-03-04 DIAGNOSIS — J9859 Other diseases of mediastinum, not elsewhere classified: Secondary | ICD-10-CM | POA: Diagnosis not present

## 2022-03-04 DIAGNOSIS — J449 Chronic obstructive pulmonary disease, unspecified: Secondary | ICD-10-CM | POA: Diagnosis not present

## 2022-03-04 DIAGNOSIS — R531 Weakness: Secondary | ICD-10-CM | POA: Diagnosis not present

## 2022-03-04 DIAGNOSIS — J9811 Atelectasis: Secondary | ICD-10-CM | POA: Diagnosis not present

## 2022-03-04 DIAGNOSIS — A419 Sepsis, unspecified organism: Secondary | ICD-10-CM | POA: Diagnosis not present

## 2022-03-04 DIAGNOSIS — I700 Atherosclerosis of aorta: Secondary | ICD-10-CM | POA: Diagnosis not present

## 2022-03-04 DIAGNOSIS — K838 Other specified diseases of biliary tract: Secondary | ICD-10-CM | POA: Diagnosis not present

## 2022-03-11 DIAGNOSIS — Z951 Presence of aortocoronary bypass graft: Secondary | ICD-10-CM | POA: Diagnosis not present

## 2022-03-11 DIAGNOSIS — I25111 Atherosclerotic heart disease of native coronary artery with angina pectoris with documented spasm: Secondary | ICD-10-CM | POA: Diagnosis not present

## 2022-03-17 DIAGNOSIS — D72829 Elevated white blood cell count, unspecified: Secondary | ICD-10-CM | POA: Diagnosis not present

## 2022-03-17 DIAGNOSIS — J431 Panlobular emphysema: Secondary | ICD-10-CM | POA: Diagnosis not present

## 2022-03-17 DIAGNOSIS — R9431 Abnormal electrocardiogram [ECG] [EKG]: Secondary | ICD-10-CM | POA: Diagnosis not present

## 2022-03-17 DIAGNOSIS — Z87440 Personal history of urinary (tract) infections: Secondary | ICD-10-CM | POA: Diagnosis not present

## 2022-03-17 DIAGNOSIS — R1110 Vomiting, unspecified: Secondary | ICD-10-CM | POA: Diagnosis not present

## 2022-03-17 DIAGNOSIS — A0472 Enterocolitis due to Clostridium difficile, not specified as recurrent: Secondary | ICD-10-CM | POA: Diagnosis not present

## 2022-03-17 DIAGNOSIS — R109 Unspecified abdominal pain: Secondary | ICD-10-CM | POA: Diagnosis not present

## 2022-03-17 DIAGNOSIS — Z87891 Personal history of nicotine dependence: Secondary | ICD-10-CM | POA: Diagnosis not present

## 2022-03-17 DIAGNOSIS — I25119 Atherosclerotic heart disease of native coronary artery with unspecified angina pectoris: Secondary | ICD-10-CM | POA: Diagnosis not present

## 2022-03-17 DIAGNOSIS — Z951 Presence of aortocoronary bypass graft: Secondary | ICD-10-CM | POA: Diagnosis not present

## 2022-03-17 DIAGNOSIS — I700 Atherosclerosis of aorta: Secondary | ICD-10-CM | POA: Diagnosis not present

## 2022-03-17 DIAGNOSIS — I10 Essential (primary) hypertension: Secondary | ICD-10-CM | POA: Diagnosis not present

## 2022-03-17 DIAGNOSIS — I252 Old myocardial infarction: Secondary | ICD-10-CM | POA: Diagnosis not present

## 2022-03-24 DIAGNOSIS — Z951 Presence of aortocoronary bypass graft: Secondary | ICD-10-CM | POA: Diagnosis not present

## 2022-03-24 DIAGNOSIS — I10 Essential (primary) hypertension: Secondary | ICD-10-CM | POA: Diagnosis not present

## 2022-03-24 DIAGNOSIS — I2510 Atherosclerotic heart disease of native coronary artery without angina pectoris: Secondary | ICD-10-CM | POA: Diagnosis not present

## 2022-03-25 ENCOUNTER — Ambulatory Visit: Payer: Medicare PPO | Admitting: Adult Health

## 2022-03-25 ENCOUNTER — Encounter: Payer: Self-pay | Admitting: Adult Health

## 2022-03-25 DIAGNOSIS — J432 Centrilobular emphysema: Secondary | ICD-10-CM

## 2022-03-25 DIAGNOSIS — Z23 Encounter for immunization: Secondary | ICD-10-CM

## 2022-03-25 MED ORDER — TRELEGY ELLIPTA 100-62.5-25 MCG/ACT IN AEPB: 1.0000 | INHALATION_SPRAY | Freq: Every day | RESPIRATORY_TRACT | 3 refills | Status: DC

## 2022-03-25 NOTE — Patient Instructions (Signed)
Continue on TRELEGY daily , rinse after use.  Albuterol inhaler as needed.  Activity as tolerated.  Flu shot today .  Need LDCT chest screening .  Follow up with Dr. Elsworth Soho  In 6 months and As needed

## 2022-03-25 NOTE — Addendum Note (Signed)
Addended by: Retia Passe on: 03/25/2022 04:36 PM   Modules accepted: Orders

## 2022-03-25 NOTE — Progress Notes (Signed)
'@Patient'  ID: Sarah Pena, female    DOB: [birth year], 81 y.o.   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  Patient has underlying moderate to severe COPD.  She is maintained on Trelegy inhaler daily.  She says overall breathing is doing well.  She denies any flare of cough or wheezing.  She denies any increased albuterol use. She dissipates in the lung cancer screening program.  Last CT was January 01, 2022 showed biapical scarring, emphysema and stable pulmonary nodules measuring 5.7 mm or less. Patient denies any chest pain orthopnea PND leg swelling or unintentional weight loss. Remains active with light house chores and shopping .  [place] Lives at home with husband. Drives.   Had UTI this summer. Had to take 3 antibiotics . Complicated by C Diff.  Is finishing up last of treatment . Stool is back to formed. Diarrhea resolved.      Allergies  Allergen Reactions   Codeine Nausea And Vomiting   Crestor [Rosuvastatin]     myalgias   Ibandronic Acid     Other reaction(s): Headache   Lipitor [Atorvastatin]     myalgias   Statins Other (See Comments)    Patient refuses> practices holistic lifestyle    Immunization History  Administered Date(s) Administered   Fluad Quad(high Dose 65+) 03/22/2019, 03/18/2021   Influenza, High Dose Seasonal PF  04/11/2017, 05/25/2017, 04/25/2018   Influenza, Quadrivalent, Recombinant, Inj, Pf 05/25/2017, 03/22/2019   Influenza,inj,Quad PF,6+ Mos 03/13/2015, 03/26/2016   Influenza,inj,quad, With Preservative 04/20/2017   Janssen (J&J) SARS-COV-2 Vaccination 10/09/2019   Pneumococcal Conjugate-13 05/12/2015   Pneumococcal Polysaccharide-23 04/11/2013   Tdap 08/03/2018   Zoster Recombinat (Shingrix) 03/25/2020, 07/01/2020    Past Medical History:  Diagnosis Date   Asthma 05/2015   CAD (coronary artery disease) of bypass graft VG to RCA 08/06/2021   Chest pain 08/06/2014   Chest pain   Closed fracture of greater tuberosity of humerus 07/21/2018   [place]D [place] disease[place]    Family history of heart disease    Former smoker 07/27/2019   H/O: hysterectomy 11/29/2019   Healthcare maintenance 07/27/2019   History of chest pain 06/24/2020   History of cholecystectomy 07/17/2020   History of hiatal hernia    'small"   History of kidney stones    History of total abdominal hysterectomy 07/17/2020   Hx of adenomatous colonic polyps 04/23/2019   [place]    Melanoma of chest wall   Myocardial infarction ([place]    "mild"  years ago   Osteoporosis 06/24/2020   Pain of left hip joint 10/24/2020   Pure hypercholesterolemia 06/24/2020   S/P PTCA (percutaneous transluminal coronary angioplasty) prox RCA 08/06/2021   Shortness of breath dyspnea    just recently- sob walking up stairs- "I think its pollen.   Shoulder pain 04/12/2018   Solitary  pulmonary nodule 03/12/2015   -noted 2009 CT chest 05/2016 >Small pulmonary nodules in both lungs are unchanged from previous exam. The largest is in the posterior left lower lobe measuring 53m.   Stiffness of right shoulder joint 06/27/2018   Trigger thumb of left hand 12/29/2017   Vitamin D deficiency 06/24/2020    Tobacco History: Social History   Tobacco Use  Smoking Status Former   Packs/day: 1.00   Years: 50.00   Total pack  years: 50.00   Types: Cigarettes   Quit date: 05/22/2014   Years since quitting: 7.8  Smokeless Tobacco Never  Tobacco Comments   quit 05/2014   Counseling given: Not Answered Tobacco comments: quit 05/2014   Outpatient Medications Prior to Visit  Medication Sig Dispense Refill   albuterol (PROAIR HFA) 108 (90 Base) MCG/ACT inhaler Inhale 2 puffs into the lungs every 6 (six) hours as needed for wheezing or shortness of breath. 56 g 3   amLODipine (NORVASC) 2.5 MG tablet TAKE 1 TABLET BY MOUTH EVERY DAY (Patient taking differently: Take 2.5 mg by mouth daily.) 90 tablet 1   aspirin 81 MG tablet Take 81 mg by mouth at bedtime.     calcium carbonate (TUMS - DOSED IN MG ELEMENTAL CALCIUM) 500 MG chewable tablet Chew 1 tablet by mouth daily as needed for indigestion or heartburn.     Cholecalciferol (VITAMIN D-3) 1000 UNITS CAPS Take 1,000 Units by mouth daily.      Dextrose-Fructose-Sod Citrate [phone] CHEW Chew 2 tablets by mouth as needed (nausea).      Elderberry 575 MG/5ML SYRP Take 15 mLs by mouth daily.     ezetimibe (ZETIA) 10 MG tablet TAKE 1 TABLET BY MOUTH EVERY DAY (Patient taking differently: Take 10 mg by mouth daily.) 90 tablet 1   Fluticasone-Umeclidin-Vilant (TRELEGY ELLIPTA) 100-62.5-25 MCG/ACT AEPB Inhale 1 puff into the lungs daily. 180 each 3   Magnesium 250 MG TABS Take 250 mg by mouth 2 (two) times daily.     metoprolol tartrate (LOPRESSOR) 25 MG tablet TAKE 1 TABLET BY MOUTH TWICE A DAY (Patient taking differently: Take 25 mg by mouth 2 (two) times daily.) 180 tablet 0   nitroGLYCERIN (NITROSTAT) 0.4 MG SL tablet Place 1 tablet (0.4 mg total) under the tongue every 5 (five) minutes x 3 doses as needed for chest pain. 25 tablet 4   OIL OF OREGANO PO Take 1 capsule by mouth daily.      OVER THE COUNTER MEDICATION Place 6 drops under the tongue 2 (two) times daily. CBD oil     ticagrelor (BRILINTA) 90 MG TABS tablet Take 1 tablet (90 mg total) by mouth 2 (two) times  daily. 60 tablet 11   TURMERIC PO Take 1 capsule by mouth daily.      No facility-administered medications prior to visit.     Review of Systems:   Constitutional:   No  weight loss, night sweats,  Fevers, chills, fatigue, or  lassitude.  HEENT:   No headaches,  Difficulty swallowing,  Tooth/dental problems, or  Sore throat,                No sneezing, itching, ear ache, nasal congestion, post nasal drip,   CV:  No chest pain,  Orthopnea, PND, swelling in lower extremities, anasarca, dizziness, palpitations, syncope.   GI  No heartburn, indigestion, abdominal pain, nausea, vomiting, diarrhea, change in bowel habits, loss of appetite, bloody stools.   Resp:  No chest wall deformity  Skin: no rash or lesions.  GU: no dysuria, change in color of urine, no urgency or frequency.  No flank pain, no hematuria   MS:  No joint pain or swelling.  No decreased range of motion.  No back pain.    Physical Exam  BP 118/64 (BP Location: Left Arm, Patient Position: Sitting, Cuff Size: Normal)   Pulse 62   Temp 98.4 F (36.9 C) (Oral)   Ht '4\' 11"'  (1.499 m)   Wt 113 lb 6.4 oz (51.4 kg)   SpO2 94%   BMI 22.90 kg/m   GEN: A/Ox3; pleasant , NAD, well nourished    HEENT:  Gravity/AT,  , NOSE-clear, THROAT-clear, no lesions, no postnasal drip or exudate noted.   NECK:  Supple w/ fair ROM; no JVD; normal carotid impulses w/o bruits; no thyromegaly or nodules palpated; no lymphadenopathy.    RESP  Clear  P & A; w/o, wheezes/ rales/ or rhonchi. no accessory muscle use, no dullness to percussion  CARD:  RRR, no m/r/g, no peripheral edema, pulses intact, no cyanosis or clubbing.  GI:   Soft & nt; nml bowel sounds; no organomegaly or masses detected.   Musco: Warm bil, no deformities or joint swelling noted.   Neuro: alert, no focal deficits noted.    Skin: Warm, no lesions or rashes    Lab Results:  CBC    Component Value Date/Time   WBC 7.9 08/06/2021 0553   RBC 4.01 08/06/2021 0553    HGB 12.6 08/06/2021 0553   HGB 13.6 08/16/2017 1556   HCT 37.8 08/06/2021 0553   HCT 41.4 08/16/2017 1556   PLT 213 08/06/2021 0553   PLT 273 08/16/2017 1556   MCV 94.3 08/06/2021 0553   MCV 90 08/16/2017 1556   MCH 31.4 08/06/2021 0553   MCHC 33.3 08/06/2021 0553   RDW 14.0 08/06/2021 0553   RDW 14.3 08/16/2017 1556   LYMPHSABS 2.1 04/23/2019 1123   LYMPHSABS 2.8 08/16/2017 1556   MONOABS 0.8 04/23/2019 1123   EOSABS 0.2 04/23/2019 1123   EOSABS 0.5 (H) 08/16/2017 1556   BASOSABS 0.1 04/23/2019 1123   BASOSABS 0.0 08/16/2017 1556    BMET    Component Value Date/Time   NA 139 08/06/2021 0553   NA 141 08/16/2017 1556   K 3.6 08/06/2021 0553   CL 109 08/06/2021 0553   CO2 23 08/06/2021 0553   GLUCOSE 93 08/06/2021 0553   BUN 11 08/06/2021 0553   BUN 12 08/16/2017 1556   CREATININE 0.74 08/06/2021 0553   CALCIUM 8.9 08/06/2021 0553   GFRNONAA >60 08/06/2021 0553   GFRAA >60 08/22/2017 0329    BNP No results found for: "BNP"  ProBNP No results found for: "PROBNP"  Imaging: No results found.        No data to display          No results found for: "NITRICOXIDE"      Assessment & Plan:   COPD (chronic obstructive pulmonary disease) ([place]) Compensated on present regimen  Continue with LDCT chest screening   Plan  Patient Instructions  Continue on TRELEGY daily , rinse after use.  Albuterol inhaler as needed.  Activity as tolerated.  Flu shot today .  Need LDCT chest screening .  Follow up with Dr. Elsworth Soho  In 6 months and As needed         Rexene Edison, NP 03/25/2022

## 2022-03-25 NOTE — Assessment & Plan Note (Signed)
Compensated on present regimen  Continue with LDCT chest screening   Plan  Patient Instructions  Continue on TRELEGY daily , rinse after use.  Albuterol inhaler as needed.  Activity as tolerated.  Flu shot today .  Need LDCT chest screening .  Follow up with Dr. Elsworth Soho  In 6 months and As needed

## 2022-05-25 DIAGNOSIS — S42111A Displaced fracture of body of scapula, right shoulder, initial encounter for closed fracture: Secondary | ICD-10-CM | POA: Diagnosis not present

## 2022-05-25 DIAGNOSIS — E559 Vitamin D deficiency, unspecified: Secondary | ICD-10-CM | POA: Diagnosis not present

## 2022-05-25 DIAGNOSIS — M19011 Primary osteoarthritis, right shoulder: Secondary | ICD-10-CM | POA: Diagnosis not present

## 2022-05-25 DIAGNOSIS — I2510 Atherosclerotic heart disease of native coronary artery without angina pectoris: Secondary | ICD-10-CM | POA: Diagnosis not present

## 2022-05-25 DIAGNOSIS — E7800 Pure hypercholesterolemia, unspecified: Secondary | ICD-10-CM | POA: Diagnosis not present

## 2022-05-25 DIAGNOSIS — Z0000 Encounter for general adult medical examination without abnormal findings: Secondary | ICD-10-CM | POA: Diagnosis not present

## 2022-05-25 DIAGNOSIS — S6291XA Unspecified fracture of right wrist and hand, initial encounter for closed fracture: Secondary | ICD-10-CM | POA: Diagnosis not present

## 2022-05-25 DIAGNOSIS — R7301 Impaired fasting glucose: Secondary | ICD-10-CM | POA: Diagnosis not present

## 2022-05-28 DIAGNOSIS — S42111A Displaced fracture of body of scapula, right shoulder, initial encounter for closed fracture: Secondary | ICD-10-CM | POA: Diagnosis not present

## 2022-06-01 DIAGNOSIS — Z951 Presence of aortocoronary bypass graft: Secondary | ICD-10-CM | POA: Diagnosis not present

## 2022-06-01 DIAGNOSIS — I25111 Atherosclerotic heart disease of native coronary artery with angina pectoris with documented spasm: Secondary | ICD-10-CM | POA: Diagnosis not present

## 2022-06-01 DIAGNOSIS — Z0000 Encounter for general adult medical examination without abnormal findings: Secondary | ICD-10-CM | POA: Diagnosis not present

## 2022-06-01 DIAGNOSIS — E559 Vitamin D deficiency, unspecified: Secondary | ICD-10-CM | POA: Diagnosis not present

## 2022-06-01 DIAGNOSIS — J449 Chronic obstructive pulmonary disease, unspecified: Secondary | ICD-10-CM | POA: Diagnosis not present

## 2022-06-01 DIAGNOSIS — M810 Age-related osteoporosis without current pathological fracture: Secondary | ICD-10-CM | POA: Diagnosis not present

## 2022-06-10 DIAGNOSIS — R58 Hemorrhage, not elsewhere classified: Secondary | ICD-10-CM | POA: Diagnosis not present

## 2022-06-10 DIAGNOSIS — L309 Dermatitis, unspecified: Secondary | ICD-10-CM | POA: Diagnosis not present

## 2022-06-10 DIAGNOSIS — Z7982 Long term (current) use of aspirin: Secondary | ICD-10-CM | POA: Diagnosis not present

## 2022-06-21 ENCOUNTER — Ambulatory Visit: Payer: Medicare PPO | Attending: Cardiology | Admitting: Cardiology

## 2022-06-21 ENCOUNTER — Encounter: Payer: Self-pay | Admitting: Cardiology

## 2022-06-21 VITALS — BP 124/80 | HR 81 | Ht 59.0 in | Wt 119.0 lb

## 2022-06-21 DIAGNOSIS — Z951 Presence of aortocoronary bypass graft: Secondary | ICD-10-CM

## 2022-06-21 DIAGNOSIS — Z9861 Coronary angioplasty status: Secondary | ICD-10-CM | POA: Diagnosis not present

## 2022-06-21 DIAGNOSIS — I10 Essential (primary) hypertension: Secondary | ICD-10-CM

## 2022-06-21 DIAGNOSIS — E7800 Pure hypercholesterolemia, unspecified: Secondary | ICD-10-CM

## 2022-06-21 DIAGNOSIS — Z87891 Personal history of nicotine dependence: Secondary | ICD-10-CM

## 2022-06-21 NOTE — Progress Notes (Signed)
Cardiology Office Note:    Date:  06/21/2022   ID:  Sarah Pena, DOB 14-Nov-1940, MRN [id number]  PCP:  Deland Pretty, MD  Cardiologist:  Jenne Campus, MD    Referring MD: Deland Pretty, MD   Chief Complaint  Patient presents with   Follow-up    History of Present Illness:    Sarah Pena is a 81 y.o. female  with past medical history significant for coronary artery disease in 2019 she had coronary bypass graft x3 with LIMA to LAD SVG to obtuse marginal branch SVG to PDA also past medical history significant for essential hypertension, history of tobacco abuse, dyslipidemia, COPD. In January of this year she ended up going to the hospital because of chest pain. She was find to have complete occluded graft going to RCA however we tried to perform PTCA and stenting of the right coronary artery which was very difficult we able to reduce stenosis from 90% to about 75% which resulted in improvement in the flow to TIMI III. Since that time she seems to be doing well. She denies have any chest pain tightness squeezing pressure burning chest. We struggling also with cholesterol-lowering medication she is unable to tolerate anything she was referred to our lipid clinic had a long discussion conversation with pharmacist but elected not to pursue PCSK9 agents she is afraid that when she will get injection she will get sick may be weeks before she starts feeling better.  She comes today to months for follow-up since that time I seen her last time few things developed.  For several she lost balance fell down and broke right scapula she is wearing sling right now.  Denies have any cardiac complaints.  There is no chest pain tightness squeezing pressure when she has no palpitations dizziness swelling of lower extremities.  She showed me her right lower extremity which got some skin changes with some hematomas over there.  She is scheduled to see a dermatologist this Wednesday which I think is a good idea  more about may be potentially having vasculitis.  It does not look like infection.  Past Medical History:  Diagnosis Date   Asthma 05/2015   CAD (coronary artery disease) of bypass graft VG to RCA 08/06/2021   Chest pain 08/06/2014   Chest pain   Closed fracture of greater tuberosity of humerus 07/21/2018   COPD (chronic obstructive pulmonary disease) ([place])    Family history of heart disease    Former smoker 07/27/2019   H/O: hysterectomy 11/29/2019   Healthcare maintenance 07/27/2019   History of chest pain 06/24/2020   History of cholecystectomy 07/17/2020   History of hiatal hernia    'small"   History of kidney stones    History of total abdominal hysterectomy 07/17/2020   Hx of adenomatous colonic polyps 04/23/2019   Melanoma ([place])    Melanoma of chest wall   Myocardial infarction ([place])    "mild"  years ago   Osteoporosis 06/24/2020   Pain of left hip joint 10/24/2020   Pure hypercholesterolemia 06/24/2020   S/P PTCA (percutaneous transluminal coronary angioplasty) prox RCA 08/06/2021   Shortness of breath dyspnea    just recently- sob walking up stairs- "I think its pollen.   Shoulder pain 04/12/2018   Solitary pulmonary nodule 03/12/2015   -noted 2009 CT chest 05/2016 >Small pulmonary nodules in both lungs are unchanged from previous exam. The largest is in the posterior left lower lobe measuring 34m.   Stiffness of right shoulder  joint 06/27/2018   Trigger thumb of left hand 12/29/2017   Vitamin D deficiency 06/24/2020    Past Surgical History:  Procedure Laterality Date   APPENDECTOMY     CHOLECYSTECTOMY N/A 10/29/2014   Procedure: LAPAROSCOPIC CHOLECYSTECTOMY WITH INTRAOPERATIVE CHOLANGIOGRAM;  Surgeon: Jackolyn Confer, MD;  Location: [facility];  Service: General;  Laterality: N/A;   COLONOSCOPY     CORONARY ARTERY BYPASS GRAFT N/A 08/18/2017   Procedure: CORONARY ARTERY BYPASS GRAFTING (CABG) x 3, USING LEFT INTERNAL MAMMARY ARTERY AND RIGHT GREATER SAPHENOUS VEIN HARVESTED  ENDOSCOPICALLY;  Surgeon: Melrose Nakayama, MD;  Location: [facility];  Service: Open Heart Surgery;  Laterality: N/A;   CORONARY BALLOON ANGIOPLASTY N/A 08/05/2021   Procedure: CORONARY BALLOON ANGIOPLASTY;  Surgeon: Troy Sine, MD;  Location: [facility];  Service: Cardiovascular;  Laterality: N/A;   EYE SURGERY Bilateral    catarct with lens replacement   HERNIA REPAIR     RIH   LEFT HEART CATH AND CORONARY ANGIOGRAPHY N/A 08/18/2017   Procedure: LEFT HEART CATH AND CORONARY ANGIOGRAPHY;  Surgeon: [name], MD;  Location: [facility];  Service: Cardiovascular;  Laterality: N/A;   LEFT HEART CATH AND CORS/GRAFTS ANGIOGRAPHY N/A 08/05/2021   Procedure: LEFT HEART CATH AND CORS/GRAFTS ANGIOGRAPHY;  Surgeon: Troy Sine, MD;  Location: [facility];  Service: Cardiovascular;  Laterality: N/A;   lipoma removed  1993   RLQ area   MELANOMA EXCISION     TEE WITHOUT CARDIOVERSION N/A 08/18/2017   Procedure: TRANSESOPHAGEAL ECHOCARDIOGRAM (TEE);  Surgeon: Melrose Nakayama, MD;  Location: [facility];  Service: Open Heart Surgery;  Laterality: N/A;   TUBAL LIGATION      Current Medications: Current Meds  Medication Sig   albuterol (PROAIR HFA) 108 (90 Base) MCG/ACT inhaler Inhale 2 puffs into the lungs every 6 (six) hours as needed for wheezing or shortness of breath.   amLODipine (NORVASC) 2.5 MG tablet TAKE 1 TABLET BY MOUTH EVERY DAY (Patient taking differently: Take 2.5 mg by mouth daily.)   aspirin 81 MG tablet Take 81 mg by mouth at bedtime.   calcium carbonate (TUMS - DOSED IN MG ELEMENTAL CALCIUM) 500 MG chewable tablet Chew 1 tablet by mouth daily as needed for indigestion or heartburn.   Cholecalciferol (VITAMIN D-3) 1000 UNITS CAPS Take 1,000 Units by mouth daily.    Dextrose-Fructose-Sod Citrate [phone] MG CHEW Chew 2 tablets by mouth as needed (nausea).    Elderberry 575 MG/5ML SYRP Take 15 mLs by mouth daily.   ezetimibe (ZETIA) 10 MG tablet TAKE  1 TABLET BY MOUTH EVERY DAY (Patient taking differently: Take 10 mg by mouth daily.)   Fluticasone-Umeclidin-Vilant (TRELEGY ELLIPTA) 100-62.5-25 MCG/ACT AEPB Inhale 1 puff into the lungs daily.   Magnesium 250 MG TABS Take 250 mg by mouth 2 (two) times daily.   metoprolol tartrate (LOPRESSOR) 25 MG tablet TAKE 1 TABLET BY MOUTH TWICE A DAY (Patient taking differently: Take 25 mg by mouth 2 (two) times daily.)   nitroGLYCERIN (NITROSTAT) 0.4 MG SL tablet Place 1 tablet (0.4 mg total) under the tongue every 5 (five) minutes x 3 doses as needed for chest pain.   OIL OF OREGANO PO Take 1 capsule by mouth daily.    OVER THE COUNTER MEDICATION Place 6 drops under the tongue 2 (two) times daily. CBD oil   TURMERIC PO Take 1 capsule by mouth daily.    [DISCONTINUED] ticagrelor (BRILINTA) 90 MG TABS tablet Take 1  tablet (90 mg total) by mouth 2 (two) times daily.     Allergies:   Codeine, Crestor [rosuvastatin], Ibandronic acid, Lipitor [atorvastatin], and Statins   Social History   Socioeconomic History   Marital status: Married    Spouse name: Not on file   Number of children: Not on file   Years of education: Not on file   Highest education level: Not on file  Occupational History   Not on file  Tobacco Use   Smoking status: Former    Packs/day: 1.00    Years: 50.00    Total pack years: 50.00    Types: Cigarettes    Quit date: 05/22/2014    Years since quitting: 8.0   Smokeless tobacco: Never   Tobacco comments:    quit 05/2014  Vaping Use   Vaping Use: Never used  Substance and Sexual Activity   Alcohol use: Yes    Alcohol/week: 0.0 standard drinks of alcohol    Comment: rarely   Drug use: No   Sexual activity: Not on file  Other Topics Concern   Not on file  Social History Narrative   Not on file   Social Determinants of Health   Financial Resource Strain: Not on file  Food Insecurity: Not on file  Transportation Needs: Not on file  Physical Activity: Not on file   Stress: Not on file  Social Connections: Not on file     Family History: The patient's family history includes Cancer in her maternal grandfather; Heart disease in her father and mother. There is no history of Colon polyps, Colon cancer, Esophageal cancer, Stomach cancer, or Rectal cancer. ROS:   Please see the history of present illness.    All 14 point review of systems negative except as described per history of present illness  EKGs/Labs/Other Studies Reviewed:      Recent Labs: 08/05/2021: ALT 51 08/06/2021: BUN 11; Creatinine, Ser 0.74; Hemoglobin 12.6; Platelets 213; Potassium 3.6; Sodium 139  Recent Lipid Panel    Component Value Date/Time   CHOL 200 08/06/2021 0553   CHOL 220 (H) 07/31/2019 0848   TRIG 93 08/06/2021 0553   HDL 52 08/06/2021 0553   HDL 70 07/31/2019 0848   CHOLHDL 3.8 08/06/2021 0553   VLDL 19 08/06/2021 0553   LDLCALC 129 (H) 08/06/2021 0553   LDLCALC 136 (H) 07/31/2019 0848   LDLDIRECT 107 (H) 12/29/2021 1530    Physical Exam:    VS:  BP 124/80 (BP Location: Left Arm, Patient Position: Sitting)   Pulse 81   Ht '4\' 11"'$  (1.499 m)   Wt 119 lb (54 kg)   SpO2 94%   BMI 24.04 kg/m     Wt Readings from Last 3 Encounters:  06/21/22 119 lb (54 kg)  03/25/22 113 lb 6.4 oz (51.4 kg)  12/29/21 117 lb 3.2 oz (53.2 kg)     GEN:  Well nourished, well developed in no acute distress HEENT: Normal NECK: No JVD; No carotid bruits LYMPHATICS: No lymphadenopathy CARDIAC: RRR, no murmurs, no rubs, no gallops RESPIRATORY:  Clear to auscultation without rales, wheezing or rhonchi  ABDOMEN: Soft, non-tender, non-distended MUSCULOSKELETAL:  No edema; No deformity  SKIN: Warm and dry LOWER EXTREMITIES: no swelling NEUROLOGIC:  Alert and oriented x 3 PSYCHIATRIC:  Normal affect   ASSESSMENT:    1. Hx of CABG X 3  2019   2. S/P PTCA (percutaneous transluminal coronary angioplasty) prox RCA 08/05/21   3. Former smoker   4. Primary hypertension  5. Pure  hypercholesterolemia    PLAN:    In order of problems listed above:  Coronary disease status post coronary bypass graft.  Doing well from that point review asymptomatic continue present management. Essential hypertension well-controlled continue present management. History of smoking again I congratulated for quitting in 2015 encouraged him to stay away from smoking .  Dyslipidemia always a problem the only thing she is able to tolerate Zetia we have multiple discussion what to eat a PCSK9 agent or Leqvio, however she is afraid to take those medications.  She understand consequences of not taking those medications   Medication Adjustments/Labs and Tests Ordered: Current medicines are reviewed at length with the patient today.  Concerns regarding medicines are outlined above.  No orders of the defined types were placed in this encounter.  Medication changes: No orders of the defined types were placed in this encounter.   Signed, Park Liter, MD, [facility] 06/21/2022 2:36 PM    [facility]

## 2022-06-21 NOTE — Patient Instructions (Signed)

## 2022-06-23 DIAGNOSIS — R233 Spontaneous ecchymoses: Secondary | ICD-10-CM | POA: Diagnosis not present

## 2022-06-23 DIAGNOSIS — L821 Other seborrheic keratosis: Secondary | ICD-10-CM | POA: Diagnosis not present

## 2022-06-23 DIAGNOSIS — L820 Inflamed seborrheic keratosis: Secondary | ICD-10-CM | POA: Diagnosis not present

## 2022-06-23 DIAGNOSIS — D485 Neoplasm of uncertain behavior of skin: Secondary | ICD-10-CM | POA: Diagnosis not present

## 2022-06-23 DIAGNOSIS — L300 Nummular dermatitis: Secondary | ICD-10-CM | POA: Diagnosis not present

## 2022-06-24 ENCOUNTER — Other Ambulatory Visit: Payer: Self-pay | Admitting: Cardiology

## 2022-07-09 DIAGNOSIS — R1110 Vomiting, unspecified: Secondary | ICD-10-CM | POA: Diagnosis not present

## 2022-07-09 DIAGNOSIS — R509 Fever, unspecified: Secondary | ICD-10-CM | POA: Diagnosis not present

## 2022-07-12 ENCOUNTER — Other Ambulatory Visit: Payer: Self-pay | Admitting: Cardiology

## 2022-07-15 DIAGNOSIS — C44519 Basal cell carcinoma of skin of other part of trunk: Secondary | ICD-10-CM | POA: Diagnosis not present

## 2022-08-04 DIAGNOSIS — Z01419 Encounter for gynecological examination (general) (routine) without abnormal findings: Secondary | ICD-10-CM | POA: Diagnosis not present

## 2022-08-04 DIAGNOSIS — M810 Age-related osteoporosis without current pathological fracture: Secondary | ICD-10-CM | POA: Diagnosis not present

## 2022-08-04 DIAGNOSIS — Z6823 Body mass index (BMI) 23.0-23.9, adult: Secondary | ICD-10-CM | POA: Diagnosis not present

## 2022-08-04 DIAGNOSIS — N952 Postmenopausal atrophic vaginitis: Secondary | ICD-10-CM | POA: Diagnosis not present

## 2022-08-04 DIAGNOSIS — Z1231 Encounter for screening mammogram for malignant neoplasm of breast: Secondary | ICD-10-CM | POA: Diagnosis not present

## 2022-08-10 ENCOUNTER — Other Ambulatory Visit: Payer: Self-pay | Admitting: Cardiology

## 2022-08-30 ENCOUNTER — Telehealth: Payer: Self-pay | Admitting: Cardiology

## 2022-08-30 NOTE — Telephone Encounter (Signed)
Pt c/o medication issue:  1. Name of Medication:  amLODipine (NORVASC) 2.5 MG tablet  metoprolol tartrate (LOPRESSOR) 25 MG tablet   2. How are you currently taking this medication (dosage and times per day)? As prescribed   3. Are you having a reaction (difficulty breathing--STAT)? Yes  4. What is your medication issue? Patient reports her legs look bruised with blood sitting under the skin and have been for the past year. She states it is now painful and itching. Per patient her dermatologist recommended she call our office regarding her medications prescribed by our office due to it possibly being the cause. She also reports her hairdresser advised she is losing her hair and she has been unable to get a full night of sleep for the past 3 weeks. Please advise.

## 2022-09-01 NOTE — Telephone Encounter (Signed)
Called pt with Pharm-D opinion regarding rash. Encouraged her to take all of her medication with her when she goes to dermatologist. Pt agreed and had no further questions.

## 2022-09-01 NOTE — Telephone Encounter (Signed)
Spoke with pt regarding message regarding leg itching and discoloration. Pt has seen PCP, GYN and Derm. Will send to Pharm-D for advice. Pt agreed and will be following up with Derm.

## 2022-09-01 NOTE — Telephone Encounter (Signed)
Could possibly be caused by the amlodipine, however it is a low dose. Patient also on many supplements which could also be the cause

## 2022-09-22 ENCOUNTER — Ambulatory Visit (HOSPITAL_BASED_OUTPATIENT_CLINIC_OR_DEPARTMENT_OTHER): Payer: Medicare PPO | Admitting: Pulmonary Disease

## 2022-09-22 ENCOUNTER — Encounter (HOSPITAL_BASED_OUTPATIENT_CLINIC_OR_DEPARTMENT_OTHER): Payer: Self-pay | Admitting: Pulmonary Disease

## 2022-09-22 VITALS — BP 126/62 | HR 67 | Ht 59.0 in | Wt 117.9 lb

## 2022-09-22 DIAGNOSIS — J432 Centrilobular emphysema: Secondary | ICD-10-CM | POA: Diagnosis not present

## 2022-09-22 DIAGNOSIS — R911 Solitary pulmonary nodule: Secondary | ICD-10-CM

## 2022-09-22 MED ORDER — TRELEGY ELLIPTA 100-62.5-25 MCG/ACT IN AEPB: 1.0000 | INHALATION_SPRAY | Freq: Every day | RESPIRATORY_TRACT | 3 refills | Status: DC

## 2022-09-22 NOTE — Patient Instructions (Signed)
X Refills on Trelegy for 3 months x 3  x annual low-dose CT chest in June 2024

## 2022-09-22 NOTE — Assessment & Plan Note (Signed)
7 mm right lower lobe nodule noted in March 2023 appears to have regressed in June. She will need annual follow-up CT scan for these nodules

## 2022-09-22 NOTE — Assessment & Plan Note (Signed)
Appears stable, refills will be provided on Trelegy. Needs albuterol only for rescue. We discussed action plan for COPD and signs and symptoms of COPD exacerbation

## 2022-09-22 NOTE — Progress Notes (Signed)
   Subjective:    Patient ID: Sarah Pena, female    DOB: 1940/12/04, 82 y.o.   MRN: [id number]  HPI  82 yo ex- smoker For follow-up of moderate COPD From [place]   She quit smoking in 2016 , husband quit smoking December 2018 [facility] - CABG 08/2017 08/2021 PTCA to native RCA  Chief Complaint  Patient presents with   Follow-up    Pt states she has been doing good since last visit and denies any complaints.   68-month follow-up visit She received antibiotics for UTI and a left CNF. She had COVID infection in January but did not get too sick, main symptom was nausea Trelegy seems to work well for her, she rarely needs albuterol. We reviewed low-dose CT chest which showed stable nodules in June, there was a 7 mm nodule noticed 3 months earlier but seems to have regressed She does report dyspnea on activity but seems like this is not related to her lungs-    Significant tests/ events reviewed   02/2015 Spirometry - severe airway obstruction - FEV1 0.9-52%, FVC 1.4-59%, ratio of 61 Spirometry 07/2016-improved FEV1 at 62%   CT angiogram 03/2008 -showed 8 mm lesion in the right apex CT chest 03/2015 >>Less than 3 mm stable bilateral upper lobe ground-glass nodules     LDCT 09/2021 new RLL nodule 7 mm  LDCT chest 12/2021 biapical scarring, emphysema and stable pulmonary nodules    EKG> 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 Also denies presyncope, palpitations, heartburn, abdominal pain, nausea, vomiting, diarrhea or change in bowel or urinary habits, dysuria,hematuria, rash, arthralgias, visual complaints, headache, numbness weakness or ataxia.     Objective:   Physical Exam  Gen. Pleasant, well-nourished, in no distress ENT -  no thrush, no pallor/icterus,no post nasal drip Neck: No JVD, no thyromegaly, no carotid bruits Lungs: no use of accessory muscles, no dullness to percussion, clear without rales or rhonchi  Cardiovascular: Rhythm regular, heart sounds  normal, no murmurs or gallops, no peripheral edema Musculoskeletal: No deformities, no cyanosis or clubbing        Assessment & Plan:

## 2022-09-25 ENCOUNTER — Other Ambulatory Visit: Payer: Self-pay | Admitting: Cardiology

## 2022-09-27 NOTE — Telephone Encounter (Signed)
Rx refill sent to pharmacy. 

## 2022-10-06 DIAGNOSIS — H40013 Open angle with borderline findings, low risk, bilateral: Secondary | ICD-10-CM | POA: Diagnosis not present

## 2022-10-14 DIAGNOSIS — Z20822 Contact with and (suspected) exposure to covid-19: Secondary | ICD-10-CM | POA: Diagnosis not present

## 2022-10-14 DIAGNOSIS — R519 Headache, unspecified: Secondary | ICD-10-CM | POA: Diagnosis not present

## 2022-10-14 DIAGNOSIS — J029 Acute pharyngitis, unspecified: Secondary | ICD-10-CM | POA: Diagnosis not present

## 2022-10-23 DIAGNOSIS — S0501XA Injury of conjunctiva and corneal abrasion without foreign body, right eye, initial encounter: Secondary | ICD-10-CM | POA: Diagnosis not present

## 2022-11-04 DIAGNOSIS — H04123 Dry eye syndrome of bilateral lacrimal glands: Secondary | ICD-10-CM | POA: Diagnosis not present

## 2022-11-04 DIAGNOSIS — S0501XA Injury of conjunctiva and corneal abrasion without foreign body, right eye, initial encounter: Secondary | ICD-10-CM | POA: Diagnosis not present

## 2022-11-09 DIAGNOSIS — R0981 Nasal congestion: Secondary | ICD-10-CM | POA: Diagnosis not present

## 2022-11-09 DIAGNOSIS — R0600 Dyspnea, unspecified: Secondary | ICD-10-CM | POA: Diagnosis not present

## 2022-11-09 DIAGNOSIS — J449 Chronic obstructive pulmonary disease, unspecified: Secondary | ICD-10-CM | POA: Diagnosis not present

## 2022-12-21 ENCOUNTER — Encounter: Payer: Self-pay | Admitting: Cardiology

## 2022-12-21 ENCOUNTER — Ambulatory Visit: Payer: Medicare PPO | Attending: Cardiology | Admitting: Cardiology

## 2022-12-21 VITALS — BP 112/82 | HR 68 | Ht 59.0 in | Wt 118.4 lb

## 2022-12-21 DIAGNOSIS — Z87891 Personal history of nicotine dependence: Secondary | ICD-10-CM | POA: Diagnosis not present

## 2022-12-21 DIAGNOSIS — I10 Essential (primary) hypertension: Secondary | ICD-10-CM | POA: Diagnosis not present

## 2022-12-21 DIAGNOSIS — Z951 Presence of aortocoronary bypass graft: Secondary | ICD-10-CM | POA: Diagnosis not present

## 2022-12-21 DIAGNOSIS — Z9861 Coronary angioplasty status: Secondary | ICD-10-CM

## 2022-12-21 DIAGNOSIS — I2510 Atherosclerotic heart disease of native coronary artery without angina pectoris: Secondary | ICD-10-CM

## 2022-12-21 DIAGNOSIS — J432 Centrilobular emphysema: Secondary | ICD-10-CM | POA: Diagnosis not present

## 2022-12-21 DIAGNOSIS — I252 Old myocardial infarction: Secondary | ICD-10-CM | POA: Diagnosis not present

## 2022-12-21 DIAGNOSIS — R0609 Other forms of dyspnea: Secondary | ICD-10-CM | POA: Diagnosis not present

## 2022-12-21 NOTE — Progress Notes (Signed)
Cardiology Office Note:    Date:  12/21/2022   ID:  Sarah Pena, DOB 1940/09/23, MRN [id number]  PCP:  Merri Brunette, MD  Cardiologist:  Gypsy Balsam, MD    Referring MD: Merri Brunette, MD   Chief Complaint  Patient presents with   Follow-up    History of Present Illness:    Sarah Pena is a 82 y.o. female   with past medical history significant for coronary artery disease in 2019 she had coronary bypass graft x3 with LIMA to LAD SVG to obtuse marginal branch SVG to PDA also past medical history significant for essential hypertension, history of tobacco abuse, dyslipidemia, COPD. In January of 2023 she ended up going to the hospital because of chest pain. She was find to have complete occluded graft going to RCA however we tried to perform PTCA and stenting of the right coronary artery which was very difficult we able to reduce stenosis from 90% to about 75% which resulted in improvement in the flow to TIMI III. Since that time she seems to be doing well. She denies have any chest pain tightness squeezing pressure burning chest. We struggling also with cholesterol-lowering medication she is unable to tolerate anything she was referred to our lipid clinic had a long discussion conversation with pharmacist but elected not to pursue PCSK9 agents she is afraid that when she will get injection she will get sick may be weeks before she starts feeling better. .  Comes today to months for follow-up overall seems to be doing well.  She denies have any chest pain tightness squeezing pressure burning chest no palpitations dizziness swelling of lower extremity seems to be doing quite well complaint however profoundly weakness and fatigue.  Past Medical History:  Diagnosis Date   Asthma 05/2015   CAD (coronary artery disease) of bypass graft VG to RCA 08/06/2021   Chest pain 08/06/2014   Chest pain   Closed fracture of greater tuberosity of humerus 07/21/2018   COPD (chronic obstructive pulmonary  disease) (HCC)    Family history of heart disease    Former smoker 07/27/2019   H/O: hysterectomy 11/29/2019   Healthcare maintenance 07/27/2019   History of chest pain 06/24/2020   History of cholecystectomy 07/17/2020   History of hiatal hernia    'small"   History of kidney stones    History of total abdominal hysterectomy 07/17/2020   Hx of adenomatous colonic polyps 04/23/2019   Melanoma (HCC)    Melanoma of chest wall   Myocardial infarction (HCC)    "mild"  years ago   Osteoporosis 06/24/2020   Pain of left hip joint 10/24/2020   Pure hypercholesterolemia 06/24/2020   S/P PTCA (percutaneous transluminal coronary angioplasty) prox RCA 08/06/2021   Shortness of breath dyspnea    just recently- sob walking up stairs- "I think its pollen.   Shoulder pain 04/12/2018   Solitary pulmonary nodule 03/12/2015   -noted 2009 CT chest 05/2016 >Small pulmonary nodules in both lungs are unchanged from previous exam. The largest is in the posterior left lower lobe measuring 5mm.   Stiffness of right shoulder joint 06/27/2018   Trigger thumb of left hand 12/29/2017   Vitamin D deficiency 06/24/2020    Past Surgical History:  Procedure Laterality Date   APPENDECTOMY     CHOLECYSTECTOMY N/A 10/29/2014   Procedure: LAPAROSCOPIC CHOLECYSTECTOMY WITH INTRAOPERATIVE CHOLANGIOGRAM;  Surgeon: Avel Peace, MD;  Location: [facility] OR;  Service: General;  Laterality: N/A;   COLONOSCOPY  CORONARY ARTERY BYPASS GRAFT N/A 08/18/2017   Procedure: CORONARY ARTERY BYPASS GRAFTING (CABG) x 3, USING LEFT INTERNAL MAMMARY ARTERY AND RIGHT GREATER SAPHENOUS VEIN HARVESTED ENDOSCOPICALLY;  Surgeon: Loreli Slot, MD;  Location: [facility] OR;  Service: Open Heart Surgery;  Laterality: N/A;   CORONARY BALLOON ANGIOPLASTY N/A 08/05/2021   Procedure: CORONARY BALLOON ANGIOPLASTY;  Surgeon: Lennette Bihari, MD;  Location: [facility] INVASIVE CV LAB;  Service: Cardiovascular;  Laterality: N/A;   EYE SURGERY Bilateral    catarct with  lens replacement   HERNIA REPAIR     RIH   LEFT HEART CATH AND CORONARY ANGIOGRAPHY N/A 08/18/2017   Procedure: LEFT HEART CATH AND CORONARY ANGIOGRAPHY;  Surgeon: Runell Gess, MD;  Location: [facility] INVASIVE CV LAB;  Service: Cardiovascular;  Laterality: N/A;   LEFT HEART CATH AND CORS/GRAFTS ANGIOGRAPHY N/A 08/05/2021   Procedure: LEFT HEART CATH AND CORS/GRAFTS ANGIOGRAPHY;  Surgeon: Lennette Bihari, MD;  Location: [facility] INVASIVE CV LAB;  Service: Cardiovascular;  Laterality: N/A;   lipoma removed  1993   RLQ area   MELANOMA EXCISION     TEE WITHOUT CARDIOVERSION N/A 08/18/2017   Procedure: TRANSESOPHAGEAL ECHOCARDIOGRAM (TEE);  Surgeon: Loreli Slot, MD;  Location: [facility] OR;  Service: Open Heart Surgery;  Laterality: N/A;   TUBAL LIGATION      Current Medications: Current Meds  Medication Sig   albuterol (PROAIR HFA) 108 (90 Base) MCG/ACT inhaler Inhale 2 puffs into the lungs every 6 (six) hours as needed for wheezing or shortness of breath.   amLODipine (NORVASC) 2.5 MG tablet Take 1 tablet (2.5 mg total) by mouth daily.   aspirin 81 MG tablet Take 81 mg by mouth at bedtime.   calcium carbonate (TUMS - DOSED IN MG ELEMENTAL CALCIUM) 500 MG chewable tablet Chew 1 tablet by mouth daily as needed for indigestion or heartburn.   Cholecalciferol (VITAMIN D-3) 1000 UNITS CAPS Take 1,000 Units by mouth daily.    Dextrose-Fructose-Sod Citrate [phone] MG CHEW Chew 2 tablets by mouth as needed (nausea).    Elderberry 575 MG/5ML SYRP Take 15 mLs by mouth daily.   ezetimibe (ZETIA) 10 MG tablet TAKE 1 TABLET BY MOUTH EVERY DAY   Fluticasone-Umeclidin-Vilant (TRELEGY ELLIPTA) 100-62.5-25 MCG/ACT AEPB Inhale 1 puff into the lungs daily.   Magnesium 250 MG TABS Take 250 mg by mouth 2 (two) times daily.   metoprolol tartrate (LOPRESSOR) 25 MG tablet Take 1 tablet (25 mg total) by mouth 2 (two) times daily.   nitroGLYCERIN (NITROSTAT) 0.4 MG SL tablet Place 1 tablet (0.4 mg total) under the  tongue every 5 (five) minutes x 3 doses as needed for chest pain.   OIL OF OREGANO PO Take 1 capsule by mouth daily.    OVER THE COUNTER MEDICATION Place 6 drops under the tongue 2 (two) times daily. CBD oil   TURMERIC PO Take 1 capsule by mouth daily.      Allergies:   Codeine, Crestor [rosuvastatin], Ibandronic acid, Lipitor [atorvastatin], and Statins   Social History   Socioeconomic History   Marital status: Married    Spouse name: Not on file   Number of children: Not on file   Years of education: Not on file   Highest education level: Not on file  Occupational History   Not on file  Tobacco Use   Smoking status: Former    Packs/day: 1.00    Years: 50.00    Additional pack years: 0.00    Total pack years: 50.00  Types: Cigarettes    Quit date: 05/22/2014    Years since quitting: 8.5   Smokeless tobacco: Never   Tobacco comments:    quit 05/2014  Vaping Use   Vaping Use: Never used  Substance and Sexual Activity   Alcohol use: Yes    Alcohol/week: 0.0 standard drinks of alcohol    Comment: rarely   Drug use: No   Sexual activity: Not on file  Other Topics Concern   Not on file  Social History Narrative   Not on file   Social Determinants of Health   Financial Resource Strain: Not on file  Food Insecurity: Not on file  Transportation Needs: Not on file  Physical Activity: Not on file  Stress: Not on file  Social Connections: Not on file     Family History: The patient's family history includes Cancer in her maternal grandfather; Heart disease in her father and mother. There is no history of Colon polyps, Colon cancer, Esophageal cancer, Stomach cancer, or Rectal cancer. ROS:   Please see the history of present illness.    All 14 point review of systems negative except as described per history of present illness  EKGs/Labs/Other Studies Reviewed:      Recent Labs: No results found for requested labs within last 365 days.  Recent Lipid Panel     Component Value Date/Time   CHOL 200 08/06/2021 0553   CHOL 220 (H) 07/31/2019 0848   TRIG 93 08/06/2021 0553   HDL 52 08/06/2021 0553   HDL 70 07/31/2019 0848   CHOLHDL 3.8 08/06/2021 0553   VLDL 19 08/06/2021 0553   LDLCALC 129 (H) 08/06/2021 0553   LDLCALC 136 (H) 07/31/2019 0848   LDLDIRECT 107 (H) 12/29/2021 1530    Physical Exam:    VS:  BP 112/82 (BP Location: Left Arm, Patient Position: Sitting, Cuff Size: Normal)   Pulse 68   Ht 4\' 11"  (1.499 m)   Wt 118 lb 6.4 oz (53.7 kg)   SpO2 92%   BMI 23.91 kg/m     Wt Readings from Last 3 Encounters:  12/21/22 118 lb 6.4 oz (53.7 kg)  09/22/22 117 lb 15.1 oz (53.5 kg)  06/21/22 119 lb (54 kg)     GEN:  Well nourished, well developed in no acute distress HEENT: Normal NECK: No JVD; No carotid bruits LYMPHATICS: No lymphadenopathy CARDIAC: RRR, no murmurs, no rubs, no gallops RESPIRATORY:  Clear to auscultation without rales, wheezing or rhonchi  ABDOMEN: Soft, non-tender, non-distended MUSCULOSKELETAL:  No edema; No deformity  SKIN: Warm and dry LOWER EXTREMITIES: no swelling NEUROLOGIC:  Alert and oriented x 3 PSYCHIATRIC:  Normal affect   ASSESSMENT:    1. Coronary artery disease with history of myocardial infarction without history of CABG   2. Primary hypertension   3. Centrilobular emphysema (HCC)   4. Hx of CABG X 3  2019   5. S/P PTCA (percutaneous transluminal coronary angioplasty) prox RCA 08/05/21   6. Former smoker    PLAN:    In order of problems listed above:  Coronary artery disease seems to be stable from that point.  Denies have any symptoms that would indicate reactivation of the problem. Essential hypertension: Blood pressures are well-controlled continue present management. COPD/emphysema.  Noted. Dyslipidemia I did review K PN which show LDL of 129 HDL 52 again she does not want to talk about PCSK9 agent or an injectable agent in that matter.  She is only on Zetia and she says she  is happy  where she is. Profound weakness and fatigue will schedule him to have echocardiogram to make sure she does not develop cardiomyopathy   Medication Adjustments/Labs and Tests Ordered: Current medicines are reviewed at length with the patient today.  Concerns regarding medicines are outlined above.  No orders of the defined types were placed in this encounter.  Medication changes: No orders of the defined types were placed in this encounter.   Signed, Georgeanna Lea, MD, [facility] 12/21/2022 2:27 PM    [facility]

## 2022-12-21 NOTE — Patient Instructions (Signed)

## 2022-12-21 NOTE — Addendum Note (Signed)
Addended by: Baldo Ash D on: 12/21/2022 04:58 PM   Modules accepted: Orders

## 2022-12-23 NOTE — Addendum Note (Signed)
Addended by: Smiley Houseman B on: 12/23/2022 07:23 AM   Modules accepted: Orders

## 2023-01-03 ENCOUNTER — Ambulatory Visit (HOSPITAL_COMMUNITY)
Admission: RE | Admit: 2023-01-03 | Discharge: 2023-01-03 | Disposition: A | Discharge status: Home / Self Care | Payer: Medicare PPO | Source: Ambulatory Visit | Attending: Acute Care | Admitting: Acute Care

## 2023-01-03 DIAGNOSIS — J432 Centrilobular emphysema: Secondary | ICD-10-CM | POA: Diagnosis not present

## 2023-01-03 DIAGNOSIS — I2510 Atherosclerotic heart disease of native coronary artery without angina pectoris: Secondary | ICD-10-CM | POA: Insufficient documentation

## 2023-01-03 DIAGNOSIS — Z122 Encounter for screening for malignant neoplasm of respiratory organs: Secondary | ICD-10-CM | POA: Diagnosis not present

## 2023-01-03 DIAGNOSIS — Z87891 Personal history of nicotine dependence: Secondary | ICD-10-CM | POA: Insufficient documentation

## 2023-01-03 DIAGNOSIS — I700 Atherosclerosis of aorta: Secondary | ICD-10-CM | POA: Insufficient documentation

## 2023-01-04 DIAGNOSIS — L578 Other skin changes due to chronic exposure to nonionizing radiation: Secondary | ICD-10-CM | POA: Diagnosis not present

## 2023-01-04 DIAGNOSIS — C44519 Basal cell carcinoma of skin of other part of trunk: Secondary | ICD-10-CM | POA: Diagnosis not present

## 2023-01-04 DIAGNOSIS — D485 Neoplasm of uncertain behavior of skin: Secondary | ICD-10-CM | POA: Diagnosis not present

## 2023-01-04 DIAGNOSIS — R233 Spontaneous ecchymoses: Secondary | ICD-10-CM | POA: Diagnosis not present

## 2023-01-04 DIAGNOSIS — L820 Inflamed seborrheic keratosis: Secondary | ICD-10-CM | POA: Diagnosis not present

## 2023-01-14 ENCOUNTER — Ambulatory Visit: Payer: Medicare PPO | Attending: Cardiology

## 2023-01-14 DIAGNOSIS — R0609 Other forms of dyspnea: Secondary | ICD-10-CM | POA: Diagnosis not present

## 2023-01-14 LAB — ECHOCARDIOGRAM COMPLETE
Area-P 1/2: 4.1 cm2
MV M vel: 4.86 m/s
MV Peak grad: 94.5 mmHg
P 1/2 time: 618 msec
S' Lateral: 2.3 cm

## 2023-01-18 ENCOUNTER — Telehealth: Payer: Self-pay | Admitting: Cardiology

## 2023-01-18 NOTE — Telephone Encounter (Signed)
Pt called in and was hoping for a call today about her echo results.  She stated she is worried about them and was hoping to hear today.  Best number [phone]

## 2023-01-18 NOTE — Telephone Encounter (Signed)
Patient informed of the results of her echo °

## 2023-02-07 ENCOUNTER — Other Ambulatory Visit: Payer: Self-pay

## 2023-02-07 MED ORDER — AMLODIPINE BESYLATE 2.5 MG PO TABS: 2.5000 mg | ORAL_TABLET | Freq: Every day | ORAL | 2 refills | Status: DC

## 2023-03-28 ENCOUNTER — Ambulatory Visit: Payer: Medicare PPO | Admitting: Adult Health

## 2023-03-28 ENCOUNTER — Encounter: Payer: Self-pay | Admitting: Adult Health

## 2023-03-28 VITALS — BP 120/80 | HR 54 | Ht 59.0 in | Wt 120.0 lb

## 2023-03-28 DIAGNOSIS — J432 Centrilobular emphysema: Secondary | ICD-10-CM | POA: Diagnosis not present

## 2023-03-28 DIAGNOSIS — Z23 Encounter for immunization: Secondary | ICD-10-CM | POA: Diagnosis not present

## 2023-03-28 NOTE — Progress Notes (Signed)
@Patient  ID: Sarah Pena, female    DOB: Nov 09, 1940, 82 y.o.   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  Patient has moderate to severe COPD.  She remains on Trelegy inhaler daily.  Says overall breathing is doing about the same.  She denies any flare of cough or wheezing.  No recent steroid use.  No increased albuterol use. She remains active.  Does light household chores and shopping.  .  Lives at home with her husband. Participates in the lung cancer CT screening program.  CT chest January 03, 2023 showed stable pulmonary nodules, emphysema. Discussed flu shot today   Caregiver for husband. He is on Oxygen. Daughters live close.    Allergies  Allergen Reactions   Codeine Nausea And Vomiting   Crestor [Rosuvastatin]     myalgias   Ibandronic Acid     Other reaction(s): Headache   Lipitor [Atorvastatin]     myalgias   Statins Other (See Comments)    Patient refuses> practices holistic lifestyle    Immunization History  Administered Date(s) Administered   Fluad Quad(high Dose 65+) 03/22/2019, 03/18/2021, 03/25/2022   Fluad Trivalent(High Dose 65+) 03/28/2023   Influenza, High Dose Seasonal PF 04/11/2017, 05/25/2017, 04/25/2018   Influenza, Quadrivalent, Recombinant, Inj, Pf 05/25/2017, 03/22/2019   Influenza,inj,Quad PF,6+ Mos 03/13/2015, 03/26/2016    Influenza,inj,quad, With Preservative 04/20/2017   Janssen (J&J) SARS-COV-2 Vaccination 10/09/2019   Pneumococcal Conjugate-13 05/12/2015   Pneumococcal Polysaccharide-23 04/11/2013   Tdap 08/03/2018   Zoster Recombinant(Shingrix) 03/25/2020, 07/01/2020    Past Medical History:  Diagnosis Date   Asthma 05/2015   CAD (coronary artery disease) of bypass graft VG to RCA 08/06/2021   Chest pain 08/06/2014   Chest pain   Closed fracture of greater tuberosity of humerus 07/21/2018   COPD (chronic obstructive pulmonary disease) (HCC)    Family history of heart disease    Former smoker 07/27/2019   H/O: hysterectomy 11/29/2019   Healthcare maintenance 07/27/2019   History of chest pain 06/24/2020   History of cholecystectomy 07/17/2020   History of hiatal hernia    'small"   History of kidney stones    History of total abdominal hysterectomy 07/17/2020   Hx of adenomatous colonic polyps 04/23/2019   Melanoma (HCC)    Melanoma of chest wall   Myocardial infarction (HCC)    "mild"  years ago   Osteoporosis 06/24/2020   Pain of left hip joint 10/24/2020   Pure hypercholesterolemia 06/24/2020   S/P PTCA (percutaneous transluminal coronary angioplasty) prox RCA 08/06/2021   Shortness of breath dyspnea    just recently- sob walking up stairs- "I think its pollen.   Shoulder pain 04/12/2018   Solitary pulmonary nodule 03/12/2015   -noted 2009 CT chest 05/2016 >Small pulmonary nodules in both lungs are unchanged from previous exam. The largest is in the posterior left lower lobe measuring 5mm.  Stiffness of right shoulder joint 06/27/2018   Trigger thumb of left hand 12/29/2017   Vitamin D deficiency 06/24/2020    Tobacco History: Social History   Tobacco Use  Smoking Status Former   Current packs/day: 0.00   Average packs/day: 1 pack/day for 50.0 years (50.0 ttl pk-yrs)   Types: Cigarettes   Start date: 05/22/1964   Quit date: 05/22/2014   Years since quitting: 8.8  Smokeless Tobacco Never   Tobacco Comments   quit 05/2014   Counseling given: Not Answered Tobacco comments: quit 05/2014   Outpatient Medications Prior to Visit  Medication Sig Dispense Refill   albuterol (PROAIR HFA) 108 (90 Base) MCG/ACT inhaler Inhale 2 puffs into the lungs every 6 (six) hours as needed for wheezing or shortness of breath. 56 g 3   aspirin 81 MG tablet Take 81 mg by mouth at bedtime.     calcium carbonate (TUMS - DOSED IN MG ELEMENTAL CALCIUM) 500 MG chewable tablet Chew 1 tablet by mouth daily as needed for indigestion or heartburn.     Cholecalciferol (VITAMIN D-3) 1000 UNITS CAPS Take 1,000 Units by mouth daily.      Dextrose-Fructose-Sod Citrate [phone] MG CHEW Chew 2 tablets by mouth as needed (nausea).      Elderberry 575 MG/5ML SYRP Take 15 mLs by mouth daily.     ezetimibe (ZETIA) 10 MG tablet TAKE 1 TABLET BY MOUTH EVERY DAY 90 tablet 3   Fluticasone-Umeclidin-Vilant (TRELEGY ELLIPTA) 100-62.5-25 MCG/ACT AEPB Inhale 1 puff into the lungs daily. 180 each 3   Magnesium 250 MG TABS Take 250 mg by mouth 2 (two) times daily.     metoprolol tartrate (LOPRESSOR) 25 MG tablet Take 1 tablet (25 mg total) by mouth 2 (two) times daily. 180 tablet 2   nitroGLYCERIN (NITROSTAT) 0.4 MG SL tablet Place 1 tablet (0.4 mg total) under the tongue every 5 (five) minutes x 3 doses as needed for chest pain. 25 tablet 4   OIL OF OREGANO PO Take 1 capsule by mouth daily.      OVER THE COUNTER MEDICATION Place 6 drops under the tongue 2 (two) times daily. CBD oil     TURMERIC PO Take 1 capsule by mouth daily.      amLODipine (NORVASC) 2.5 MG tablet Take 1 tablet (2.5 mg total) by mouth daily. 90 tablet 2   No facility-administered medications prior to visit.     Review of Systems:   Constitutional:   No  weight loss, night sweats,  Fevers, chills,  +fatigue, or  lassitude.  HEENT:   No headaches,  Difficulty swallowing,  Tooth/dental problems, or  Sore throat,                No sneezing, itching,  ear ache, nasal congestion, post nasal drip,   CV:  No chest pain,  Orthopnea, PND, swelling in lower extremities, anasarca, dizziness, palpitations, syncope.   GI  No heartburn, indigestion, abdominal pain, nausea, vomiting, diarrhea, change in bowel habits, loss of appetite, bloody stools.   Resp:  No chest wall deformity  Skin: no rash or lesions.  GU: no dysuria, change in color of urine, no urgency or frequency.  No flank pain, no hematuria   MS:  No joint pain or swelling.  No decreased range of motion.  No back pain.    Physical Exam  BP 120/80   Pulse (!) 54   Ht 4\' 11"  (1.499 m)   Wt 120 lb (54.4 kg)  SpO2 96%   BMI 24.24 kg/m   GEN: A/Ox3; pleasant , NAD, well nourished    HEENT:  [place]/AT,  NOSE-clear, THROAT-clear, no lesions, no postnasal drip or exudate noted.   NECK:  Supple w/ fair ROM; no JVD; normal carotid impulses w/o bruits; no thyromegaly or nodules palpated; no lymphadenopathy.    RESP  Clear  P & A; w/o, wheezes/ rales/ or rhonchi. no accessory muscle use, no dullness to percussion  CARD:  RRR, no m/r/g, no peripheral edema, pulses intact, no cyanosis or clubbing.  GI:   Soft & nt; nml bowel sounds; no organomegaly or masses detected.   Musco: Warm bil, no deformities or joint swelling noted.   Neuro: alert, no focal deficits noted.    Skin: Warm, no lesions or rashes    Lab Results:  CBC   BMET   BNP No results found for: "BNP"  ProBNP No results found for: "PROBNP"  Imaging: No results found.  Administration History     None           No data to display          No results found for: "NITRICOXIDE"      Assessment & Plan:   COPD (chronic obstructive pulmonary disease) (HCC) Moderate COPD with emphysema -appears stable  Continue on Trelegy .  Flu shot today  CT chest 12/2022 stable   Plan  Patient Instructions  Continue on TRELEGY daily , rinse after use.  Albuterol inhaler as needed.  Activity as  tolerated.  Flu shot today .  Follow up with Dr. Vassie Loll  In 6 months and As needed        Rubye Oaks, NP 03/28/2023

## 2023-03-28 NOTE — Patient Instructions (Signed)
Continue on TRELEGY daily , rinse after use.  Albuterol inhaler as needed.  Activity as tolerated.  Flu shot today .  Follow up with Dr. Vassie Loll  In 6 months and As needed

## 2023-03-28 NOTE — Assessment & Plan Note (Signed)
Moderate COPD with emphysema -appears stable  Continue on Trelegy .  Flu shot today  CT chest 12/2022 stable   Plan  Patient Instructions  Continue on TRELEGY daily , rinse after use.  Albuterol inhaler as needed.  Activity as tolerated.  Flu shot today .  Follow up with Dr. Vassie Loll  In 6 months and As needed

## 2023-03-29 DIAGNOSIS — R070 Pain in throat: Secondary | ICD-10-CM | POA: Diagnosis not present

## 2023-03-29 DIAGNOSIS — J069 Acute upper respiratory infection, unspecified: Secondary | ICD-10-CM | POA: Diagnosis not present

## 2023-03-29 DIAGNOSIS — R051 Acute cough: Secondary | ICD-10-CM | POA: Diagnosis not present

## 2023-04-26 DIAGNOSIS — R079 Chest pain, unspecified: Secondary | ICD-10-CM | POA: Diagnosis not present

## 2023-04-26 LAB — LAB REPORT - SCANNED: EGFR: 60

## 2023-05-02 NOTE — Progress Notes (Signed)
the lab in stable condition. She'll be contacted by T CTS on Monday and will be seen next week for evaluation and scheduling her revascularization procedure. She left the lab in stable condition.  Sarah Pena. MD, [facility] 08/18/2017 4:29 PM  Findings Coronary Findings Diagnostic  Dominance: Right  Left Main Dist LM to Ost LAD lesion is 99% stenosed. The lesion is calcified.  Left Anterior Descending Prox LAD lesion is 95% stenosed. The lesion is calcified. Mid LAD lesion is 95% stenosed. The lesion is calcified.  Left Circumflex Ost Cx lesion is 70% stenosed. The lesion is calcified.  Right Coronary Artery Prox RCA lesion is 50% stenosed. Mid RCA lesion is 80% stenosed.  Intervention  No interventions have  been documented.   STRESS TESTS  MYOCARDIAL PERFUSION IMAGING 04/16/2008   ECHOCARDIOGRAM  ECHOCARDIOGRAM COMPLETE 01/14/2023  Narrative ECHOCARDIOGRAM REPORT    Patient Name:   Sarah Pena Date of Exam: 01/14/2023 Medical Rec #:  [id number]     Height:       59.0 in Accession #:    [id number]    Weight:       118.4 lb Date of Birth:  November 07, 1940     BSA:          1.476 m Patient Age:    81 years      BP:           112/82 mmHg Patient Gender: F             HR:           73 bpm. Exam Location:  [place]  Procedure: 2D Echo, Cardiac Doppler and Color Doppler  Indications:    Dyspnea on exertion [R06.09]  History:        Patient has no prior history of Echocardiogram examinations. CAD, Prior CABG, COPD, Signs/Symptoms:Fatigue and Dyspnea; Risk Factors:Hypertension, Dyslipidemia and Former Smoker.  Sonographer:    Margreta Journey RDCS Referring Phys: [id number] Georgeanna Lea   Sonographer Comments: Global longitudinal strain was attempted. IMPRESSIONS   1. Left ventricular ejection fraction, by estimation, is 60 to 65%. The left ventricle has normal function. The left ventricle has no regional wall motion abnormalities. Left ventricular diastolic parameters are consistent with Grade I diastolic dysfunction (impaired relaxation). 2. Right ventricular systolic function is normal. The right ventricular size is normal. There is normal pulmonary artery systolic pressure. 3. The mitral valve is normal in structure. Mild to moderate mitral valve regurgitation. No evidence of mitral stenosis. 4. The aortic valve is calcified. There is mild calcification of the aortic valve. There is mild thickening of the aortic valve. Aortic valve regurgitation is mild. No aortic stenosis is present. 5. The inferior vena cava is normal in size with greater than 50% respiratory variability, suggesting right atrial pressure of 3 mmHg.  FINDINGS Left Ventricle: Left ventricular ejection fraction, by  estimation, is 60 to 65%. The left ventricle has normal function. The left ventricle has no regional wall motion abnormalities. The left ventricular internal cavity size was normal in size. There is no left ventricular hypertrophy. Left ventricular diastolic parameters are consistent with Grade I diastolic dysfunction (impaired relaxation).  Right Ventricle: The right ventricular size is normal. No increase in right ventricular wall thickness. Right ventricular systolic function is normal. There is normal pulmonary artery systolic pressure. The tricuspid regurgitant velocity is 2.62 m/s, and with an assumed right atrial pressure of 3 mmHg, the estimated right ventricular systolic pressure is 30.5 mmHg.  Left Atrium: Left atrial  a telescope guideliner the wound catheters were never able to get beyond the proximal stenosis.  At the completion of the procedure, there was brisk TIMI-3 flow, the patient was chest pain-free and had stable hemodynamics.  RECOMMENDATION: The patient has not been on any medical therapy for angina, BP or lipid management.  Apparently she has refused statins in the past. Will initiate low-dose beta-blocker therapy with metoprolol tartrate  12.5 mg twice a day and initiate amlodipine 2.5 mg.  Depending upon tolerability may be able to initiate isosorbide as an outpatient.  Would strongly recommend initiation of statin therapy or consideration of Zetia and PCSK9 inhibition.  Findings Coronary Findings Diagnostic  Dominance: Right  Left Main Dist LM to Ost LAD lesion is 95% stenosed.  Left Anterior Descending Mid LAD lesion is 100% stenosed.  Left Circumflex Ost Cx to Prox Cx lesion is 80% stenosed.  Right Coronary Artery Prox RCA-1 lesion is 99% stenosed. The lesion is eccentric. The lesion is calcified. Prox RCA-2 lesion is 50% stenosed. Prox RCA to Mid RCA lesion is 80% stenosed. Mid RCA lesion is 95% stenosed. Dist RCA lesion is 50% stenosed.  Graft To Mid LAD  Graft To 1st Mrg  Graft To RPDA Origin lesion is 100% stenosed.  Intervention  Prox RCA-1 lesion Angioplasty Balloon angioplasty was performed. Post-Intervention Lesion Assessment The intervention was successful. Pre-interventional TIMI flow is 3. Post-intervention TIMI flow is 3. No complications occurred at this lesion. There is a 75% residual stenosis post intervention.   CARDIAC CATHETERIZATION  CARDIAC CATHETERIZATION 08/18/2017  Narrative Images from the original result were not included.   Dist LM to Ost LAD lesion is 99% stenosed.  Ost Cx lesion is 70% stenosed.  Prox LAD lesion is 95% stenosed.  Mid LAD lesion is 95% stenosed.  Prox RCA lesion is 50% stenosed.  Mid RCA lesion is 80% stenosed.  The left ventricular systolic function is normal.  LV end diastolic pressure is normal.  The left ventricular ejection fraction is 55-65% by visual estimate.  Sarah Pena is a 82 y.o. female   [id number] LOCATION:  FACILITY: [facility] PHYSICIAN: Sarah Pena, M.D. [birth year]   DATE OF PROCEDURE:  08/18/2017  DATE OF DISCHARGE:     CARDIAC CATHETERIZATION    History obtained from chart review.Sarah Pena is a 82 y.o.   thin-appearing married Caucasian female patient of Dr. Zollie Beckers Pharr's who I last saw in the office 08/06/14. She has a long history of tobacco abuse having discontinued this 3 years ago after being treated for pneumonia. She has a strong family history of heart disease with both parents died of heart-related issues. She had a negative stress test in our office 04/16/08. She has been relatively stable until recently when she developed new onset substernal chest pain several weeks ago which has been occurring on a daily basis and increasing in frequency and severity. She actually had an episode which prompted her to get go to the emergency room however the pain resolved spontaneously and he turned around and went home. Given the crescendo nature of her symptoms and her risk factors elected to proceed with coronary angiography as an outpatient. The right radial approach.  Impression Ms. Campton has 99% calcified ostial LAD disease in addition to moderate circumflex and RCA disease with normal LV function. She has crescendo angina. She will require coronary artery bypass grafting. The sheath was removed and a TR band was placed on the right wrist to achieve patent hemostasis. The patient left  Cardiology Office Note:  .   Date:  05/03/2023  ID:  Sarah Pena, DOB 04-27-1941, MRN [id number] PCP: Merri Brunette, MD  [facility] Providers Cardiologist:  Sarah Batty, MD    History of Present Illness: .   Sarah Pena is a 82 y.o. female with a past medical history of CAD s/p CABG x 3, hypertension, COPD, GERD, former tobacco abuse, dyslipidemia.  01/14/2023 echo EF 60 to 65%, grade 1 DD, mild to moderate MR, mild calcification of aortic valve with mild thickening and regurgitation without stenosis 08/05/2021 left heart cath severe multivessel CAD  She was evaluated at [facility] on 04/26/2023 in the emergency department with complaints of chest pain that awoken her from her sleep.  The pain was sharp radiated to her back and was accompanied by mild shortness of breath.  She took 3 Tums without improvement, she also took 3 sublingual nitroglycerin without improvement.  EKG revealed sinus rhythm, negative for any acute changes, chest x-ray was unrevealing, troponin was negative.  Due to her comorbid conditions admission for observation status was offered however this was ultimately declined.  She presents today for follow-up after recent ED visit as outlined above.  She recalls the events of the night, did not feel her pain was typical of angina for her however her husband was very concerned about her and she ultimately decided to follow-up at the emergency department.  She has not had any further incidents.  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  We did discuss modifiable risk factors including dyslipidemia however she has been intolerant of statins in the past and prefers a more holistic approach to managing her cholesterol. She denies chest pain, palpitations, dyspnea, pnd, orthopnea, n, v, dizziness, syncope, edema, weight gain, or early satiety.   ROS: Review of Systems  All other systems reviewed and are negative.     Studies Reviewed: .        Cardiac Studies & Procedures   CARDIAC CATHETERIZATION  CARDIAC CATHETERIZATION 08/05/2021  Narrative   Mid LAD lesion is 100% stenosed.   Dist LM to Ost LAD lesion is 95% stenosed.   Ost Cx to Prox Cx lesion is 80% stenosed.   Dist RCA lesion is 50% stenosed.   Mid RCA lesion is 95% stenosed.   Prox RCA-1 lesion is 99% stenosed.   Prox RCA-2 lesion is 50% stenosed.   Prox RCA to Mid RCA lesion is 80% stenosed.   Origin lesion is 100% stenosed.   Balloon angioplasty was performed.   Post intervention, there is a 75% residual stenosis.   The left ventricular systolic function is normal.  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  The LAD has 95% calcified ostial stenosis and is totally occluded after first septal perforating artery and small diagonal vessel.  The circumflex vessel has diffuse proximal 80% stenosis.  The native right coronary artery has significant coronary calcification with 99% eccentric calcified stenosis on the proximal bend in the vessel followed by 5080 and 95% mid stenoses with 50% distal stenosis.  Patent Y graft with a LIMA portion to the LAD and SVG portion to the OM1 vessel.  Total ostial occlusion of the vein graft which had supplied the PDA vessel.  Very difficult PCI to the calcified RCA with ultimate PTCA of the 99% eccentric proximal stenosis to approximately 75%.  Due to the vessel calcification, angulation, and catheter backup difficulties, despite attempt using  the lab in stable condition. She'll be contacted by T CTS on Monday and will be seen next week for evaluation and scheduling her revascularization procedure. She left the lab in stable condition.  Sarah Pena. MD, [facility] 08/18/2017 4:29 PM  Findings Coronary Findings Diagnostic  Dominance: Right  Left Main Dist LM to Ost LAD lesion is 99% stenosed. The lesion is calcified.  Left Anterior Descending Prox LAD lesion is 95% stenosed. The lesion is calcified. Mid LAD lesion is 95% stenosed. The lesion is calcified.  Left Circumflex Ost Cx lesion is 70% stenosed. The lesion is calcified.  Right Coronary Artery Prox RCA lesion is 50% stenosed. Mid RCA lesion is 80% stenosed.  Intervention  No interventions have  been documented.   STRESS TESTS  MYOCARDIAL PERFUSION IMAGING 04/16/2008   ECHOCARDIOGRAM  ECHOCARDIOGRAM COMPLETE 01/14/2023  Narrative ECHOCARDIOGRAM REPORT    Patient Name:   Sarah Pena Date of Exam: 01/14/2023 Medical Rec #:  [id number]     Height:       59.0 in Accession #:    [id number]    Weight:       118.4 lb Date of Birth:  November 07, 1940     BSA:          1.476 m Patient Age:    81 years      BP:           112/82 mmHg Patient Gender: F             HR:           73 bpm. Exam Location:  [place]  Procedure: 2D Echo, Cardiac Doppler and Color Doppler  Indications:    Dyspnea on exertion [R06.09]  History:        Patient has no prior history of Echocardiogram examinations. CAD, Prior CABG, COPD, Signs/Symptoms:Fatigue and Dyspnea; Risk Factors:Hypertension, Dyslipidemia and Former Smoker.  Sonographer:    Margreta Journey RDCS Referring Phys: [id number] Georgeanna Lea   Sonographer Comments: Global longitudinal strain was attempted. IMPRESSIONS   1. Left ventricular ejection fraction, by estimation, is 60 to 65%. The left ventricle has normal function. The left ventricle has no regional wall motion abnormalities. Left ventricular diastolic parameters are consistent with Grade I diastolic dysfunction (impaired relaxation). 2. Right ventricular systolic function is normal. The right ventricular size is normal. There is normal pulmonary artery systolic pressure. 3. The mitral valve is normal in structure. Mild to moderate mitral valve regurgitation. No evidence of mitral stenosis. 4. The aortic valve is calcified. There is mild calcification of the aortic valve. There is mild thickening of the aortic valve. Aortic valve regurgitation is mild. No aortic stenosis is present. 5. The inferior vena cava is normal in size with greater than 50% respiratory variability, suggesting right atrial pressure of 3 mmHg.  FINDINGS Left Ventricle: Left ventricular ejection fraction, by  estimation, is 60 to 65%. The left ventricle has normal function. The left ventricle has no regional wall motion abnormalities. The left ventricular internal cavity size was normal in size. There is no left ventricular hypertrophy. Left ventricular diastolic parameters are consistent with Grade I diastolic dysfunction (impaired relaxation).  Right Ventricle: The right ventricular size is normal. No increase in right ventricular wall thickness. Right ventricular systolic function is normal. There is normal pulmonary artery systolic pressure. The tricuspid regurgitant velocity is 2.62 m/s, and with an assumed right atrial pressure of 3 mmHg, the estimated right ventricular systolic pressure is 30.5 mmHg.  Left Atrium: Left atrial  the lab in stable condition. She'll be contacted by T CTS on Monday and will be seen next week for evaluation and scheduling her revascularization procedure. She left the lab in stable condition.  Sarah Pena. MD, [facility] 08/18/2017 4:29 PM  Findings Coronary Findings Diagnostic  Dominance: Right  Left Main Dist LM to Ost LAD lesion is 99% stenosed. The lesion is calcified.  Left Anterior Descending Prox LAD lesion is 95% stenosed. The lesion is calcified. Mid LAD lesion is 95% stenosed. The lesion is calcified.  Left Circumflex Ost Cx lesion is 70% stenosed. The lesion is calcified.  Right Coronary Artery Prox RCA lesion is 50% stenosed. Mid RCA lesion is 80% stenosed.  Intervention  No interventions have  been documented.   STRESS TESTS  MYOCARDIAL PERFUSION IMAGING 04/16/2008   ECHOCARDIOGRAM  ECHOCARDIOGRAM COMPLETE 01/14/2023  Narrative ECHOCARDIOGRAM REPORT    Patient Name:   Sarah Pena Date of Exam: 01/14/2023 Medical Rec #:  [id number]     Height:       59.0 in Accession #:    [id number]    Weight:       118.4 lb Date of Birth:  November 07, 1940     BSA:          1.476 m Patient Age:    81 years      BP:           112/82 mmHg Patient Gender: F             HR:           73 bpm. Exam Location:  [place]  Procedure: 2D Echo, Cardiac Doppler and Color Doppler  Indications:    Dyspnea on exertion [R06.09]  History:        Patient has no prior history of Echocardiogram examinations. CAD, Prior CABG, COPD, Signs/Symptoms:Fatigue and Dyspnea; Risk Factors:Hypertension, Dyslipidemia and Former Smoker.  Sonographer:    Margreta Journey RDCS Referring Phys: [id number] Georgeanna Lea   Sonographer Comments: Global longitudinal strain was attempted. IMPRESSIONS   1. Left ventricular ejection fraction, by estimation, is 60 to 65%. The left ventricle has normal function. The left ventricle has no regional wall motion abnormalities. Left ventricular diastolic parameters are consistent with Grade I diastolic dysfunction (impaired relaxation). 2. Right ventricular systolic function is normal. The right ventricular size is normal. There is normal pulmonary artery systolic pressure. 3. The mitral valve is normal in structure. Mild to moderate mitral valve regurgitation. No evidence of mitral stenosis. 4. The aortic valve is calcified. There is mild calcification of the aortic valve. There is mild thickening of the aortic valve. Aortic valve regurgitation is mild. No aortic stenosis is present. 5. The inferior vena cava is normal in size with greater than 50% respiratory variability, suggesting right atrial pressure of 3 mmHg.  FINDINGS Left Ventricle: Left ventricular ejection fraction, by  estimation, is 60 to 65%. The left ventricle has normal function. The left ventricle has no regional wall motion abnormalities. The left ventricular internal cavity size was normal in size. There is no left ventricular hypertrophy. Left ventricular diastolic parameters are consistent with Grade I diastolic dysfunction (impaired relaxation).  Right Ventricle: The right ventricular size is normal. No increase in right ventricular wall thickness. Right ventricular systolic function is normal. There is normal pulmonary artery systolic pressure. The tricuspid regurgitant velocity is 2.62 m/s, and with an assumed right atrial pressure of 3 mmHg, the estimated right ventricular systolic pressure is 30.5 mmHg.  Left Atrium: Left atrial

## 2023-05-03 ENCOUNTER — Encounter: Payer: Self-pay | Admitting: Cardiology

## 2023-05-03 ENCOUNTER — Ambulatory Visit: Payer: Medicare PPO | Attending: Cardiology | Admitting: Cardiology

## 2023-05-03 VITALS — BP 124/72 | HR 65 | Ht 59.0 in | Wt 119.0 lb

## 2023-05-03 DIAGNOSIS — I252 Old myocardial infarction: Secondary | ICD-10-CM | POA: Diagnosis not present

## 2023-05-03 DIAGNOSIS — Z9861 Coronary angioplasty status: Secondary | ICD-10-CM | POA: Diagnosis not present

## 2023-05-03 DIAGNOSIS — I10 Essential (primary) hypertension: Secondary | ICD-10-CM | POA: Diagnosis not present

## 2023-05-03 DIAGNOSIS — Z951 Presence of aortocoronary bypass graft: Secondary | ICD-10-CM | POA: Diagnosis not present

## 2023-05-03 DIAGNOSIS — Z87891 Personal history of nicotine dependence: Secondary | ICD-10-CM

## 2023-05-03 DIAGNOSIS — I2510 Atherosclerotic heart disease of native coronary artery without angina pectoris: Secondary | ICD-10-CM

## 2023-05-03 DIAGNOSIS — Z789 Other specified health status: Secondary | ICD-10-CM

## 2023-05-03 DIAGNOSIS — E7800 Pure hypercholesterolemia, unspecified: Secondary | ICD-10-CM | POA: Diagnosis not present

## 2023-05-03 NOTE — Patient Instructions (Signed)
Medication Instructions:  Your physician recommends that you continue on your current medications as directed. Please refer to the Current Medication list given to you today.  *If you need a refill on your cardiac medications before your next appointment, please call your pharmacy*   Lab Work: NONE If you have labs (blood work) drawn today and your tests are completely normal, you will receive your results only by: MyChart Message (if you have MyChart) OR A paper copy in the mail If you have any lab test that is abnormal or we need to change your treatment, we will call you to review the results.   Testing/Procedures: NONE   Follow-Up: At [facility], you and your health needs are our priority.  As part of our continuing mission to provide you with exceptional heart care, we have created designated Provider Care Teams.  These Care Teams include your primary Cardiologist (physician) and Advanced Practice Providers (APPs -  Physician Assistants and Nurse Practitioners) who all work together to provide you with the care you need, when you need it.  We recommend signing up for the patient portal called "MyChart".  Sign up information is provided on this After Visit Summary.  MyChart is used to connect with patients for Virtual Visits (Telemedicine).  Patients are able to view lab/test results, encounter notes, upcoming appointments, etc.  Non-urgent messages can be sent to your provider as well.   To learn more about what you can do with MyChart, go to [email or web address].    Your next appointment:  Keep appointment with [facility]    Provider:   Gypsy Balsam, MD    Other Instructions

## 2023-06-21 ENCOUNTER — Other Ambulatory Visit: Payer: Self-pay | Admitting: Cardiology

## 2023-09-06 ENCOUNTER — Other Ambulatory Visit: Payer: Self-pay

## 2023-09-06 ENCOUNTER — Telehealth: Payer: Self-pay | Admitting: Cardiology

## 2023-09-06 MED ORDER — EZETIMIBE 10 MG PO TABS: 10.0000 mg | ORAL_TABLET | Freq: Every day | ORAL | 3 refills | Status: AC

## 2023-09-06 NOTE — Telephone Encounter (Signed)
*  STAT* If patient is at the pharmacy, call can be transferred to refill team.   1. Which medications need to be refilled? (please list name of each medication and dose if known)  ezetimibe (ZETIA) 10 MG tablet  2. Which pharmacy/location (including street and city if local pharmacy) is medication to be sent to? [id number] - , [place] - [address]  3. Do they need a 30 day or 90 day supply?   90 day supply  Patient states she is almost out of medication.

## 2023-09-15 DIAGNOSIS — L570 Actinic keratosis: Secondary | ICD-10-CM | POA: Diagnosis not present

## 2023-09-15 DIAGNOSIS — L300 Nummular dermatitis: Secondary | ICD-10-CM | POA: Diagnosis not present

## 2023-09-15 DIAGNOSIS — D485 Neoplasm of uncertain behavior of skin: Secondary | ICD-10-CM | POA: Diagnosis not present

## 2023-10-03 DIAGNOSIS — C44619 Basal cell carcinoma of skin of left upper limb, including shoulder: Secondary | ICD-10-CM | POA: Diagnosis not present

## 2023-10-10 DIAGNOSIS — H40013 Open angle with borderline findings, low risk, bilateral: Secondary | ICD-10-CM | POA: Diagnosis not present

## 2023-11-07 DIAGNOSIS — Z0000 Encounter for general adult medical examination without abnormal findings: Secondary | ICD-10-CM | POA: Diagnosis not present

## 2023-11-07 DIAGNOSIS — E559 Vitamin D deficiency, unspecified: Secondary | ICD-10-CM | POA: Diagnosis not present

## 2023-11-07 LAB — LAB REPORT - SCANNED
A1c: 6.3
EGFR: 77

## 2023-11-14 DIAGNOSIS — E559 Vitamin D deficiency, unspecified: Secondary | ICD-10-CM | POA: Diagnosis not present

## 2023-11-14 DIAGNOSIS — M810 Age-related osteoporosis without current pathological fracture: Secondary | ICD-10-CM | POA: Diagnosis not present

## 2023-11-14 DIAGNOSIS — I25111 Atherosclerotic heart disease of native coronary artery with angina pectoris with documented spasm: Secondary | ICD-10-CM | POA: Diagnosis not present

## 2023-11-14 DIAGNOSIS — Z0000 Encounter for general adult medical examination without abnormal findings: Secondary | ICD-10-CM | POA: Diagnosis not present

## 2023-11-14 DIAGNOSIS — J449 Chronic obstructive pulmonary disease, unspecified: Secondary | ICD-10-CM | POA: Diagnosis not present

## 2023-11-24 ENCOUNTER — Encounter (HOSPITAL_BASED_OUTPATIENT_CLINIC_OR_DEPARTMENT_OTHER): Payer: Self-pay | Admitting: Pulmonary Disease

## 2023-11-24 ENCOUNTER — Other Ambulatory Visit (HOSPITAL_BASED_OUTPATIENT_CLINIC_OR_DEPARTMENT_OTHER): Payer: Self-pay | Admitting: Pulmonary Disease

## 2023-11-24 ENCOUNTER — Ambulatory Visit (HOSPITAL_BASED_OUTPATIENT_CLINIC_OR_DEPARTMENT_OTHER): Payer: Medicare PPO | Admitting: Pulmonary Disease

## 2023-11-24 DIAGNOSIS — J449 Chronic obstructive pulmonary disease, unspecified: Secondary | ICD-10-CM | POA: Diagnosis not present

## 2023-11-24 DIAGNOSIS — Z87891 Personal history of nicotine dependence: Secondary | ICD-10-CM | POA: Diagnosis not present

## 2023-11-24 DIAGNOSIS — J432 Centrilobular emphysema: Secondary | ICD-10-CM

## 2023-11-24 MED ORDER — TRELEGY ELLIPTA 100-62.5-25 MCG/ACT IN AEPB: 1.0000 | INHALATION_SPRAY | Freq: Every day | RESPIRATORY_TRACT | 3 refills | Status: AC

## 2023-11-24 NOTE — Patient Instructions (Signed)
 YOUR PLAN:  -CHRONIC OBSTRUCTIVE PULMONARY DISEASE (COPD): COPD is a chronic lung condition that makes it hard to breathe. Your COPD is well-controlled with Trelegy, and you have not had any recent flare-ups. We renewed your Trelegy prescription for one year. Please report any increase in wheezing or yellow-green sputum, as this may require additional treatment. Continue staying active to support your respiratory health, and rinse your mouth after using Trelegy to prevent hoarseness.

## 2023-11-24 NOTE — Progress Notes (Signed)
   Subjective:    Patient ID: Sarah Pena, female    DOB: 1940/11/21, 83 y.o.   MRN: [id number]  HPI  83 yo ex- smoker For follow-up of moderate COPD From    She quit smoking in 2016 , husband quit smoking December 2018 [facility] - CABG 08/2017 08/2021 PTCA to native RCA  follow-up visit. She is accompanied by her daughter, Sarah Pena.  She was last seen in September 2024 and continues on Trelegy since 2015. She has no recent flare-ups, coughing, or wheezing, and her breathing is stable. She orders a three-month supply to manage costs and requires a prescription renewal.  She experiences a runny nose and hoarseness, particularly in the evenings, which she attributes to allergies. The hoarseness resolves once she is home and settled.  She remains active by cleaning her house and using stairs. She lives in a two-story house, primarily using the downstairs. Her daughter lives nearby, and she plans to start walking together for exercise. She is coping with the recent loss of her husband, who was on oxygen therapy and entered hospice care in January.  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 Also denies presyncope, palpitations, heartburn, abdominal pain, nausea, vomiting,  diarrhea or change in bowel or urinary habits, dysuria,hematuria, rash, arthralgias, visual complaints, headache, numbness weakness or ataxia.     Objective:   Physical Exam  Gen. Pleasant, elderly, thin in no distress ENT - no thrush, no pallor/icterus,no post nasal drip Neck: No JVD, no thyromegaly, no carotid bruits Lungs: no use of accessory muscles, no dullness to percussion, clear without rales or rhonchi  Cardiovascular: Rhythm regular, heart sounds  normal, no murmurs or gallops, no peripheral edema Musculoskeletal: No deformities, no cyanosis or clubbing        Assessment & Plan:   Chronic Obstructive Pulmonary Disease (COPD) COPD is well-controlled with Trelegy, with no recent exacerbations, coughing, or wheezing. She reports normal breathing and remains physically active. Hoarseness is likely related to Trelegy use. - Renew Trelegy prescription for one year. - Report any COPD exacerbations, including increased wheezing or yellow-green sputum, for potential antibiotic or steroid treatment. - Encourage continued physical activity to support respiratory health. - Advise rinsing mouth after Trelegy use to prevent hoarseness. - No further imaging for lung nodules unless new symptoms develop.

## 2023-12-12 DIAGNOSIS — M65331 Trigger finger, right middle finger: Secondary | ICD-10-CM | POA: Diagnosis not present

## 2023-12-16 DIAGNOSIS — Z01419 Encounter for gynecological examination (general) (routine) without abnormal findings: Secondary | ICD-10-CM | POA: Diagnosis not present

## 2023-12-16 DIAGNOSIS — N958 Other specified menopausal and perimenopausal disorders: Secondary | ICD-10-CM | POA: Diagnosis not present

## 2023-12-16 DIAGNOSIS — Z6822 Body mass index (BMI) 22.0-22.9, adult: Secondary | ICD-10-CM | POA: Diagnosis not present

## 2023-12-16 DIAGNOSIS — N952 Postmenopausal atrophic vaginitis: Secondary | ICD-10-CM | POA: Diagnosis not present

## 2023-12-16 DIAGNOSIS — Z1231 Encounter for screening mammogram for malignant neoplasm of breast: Secondary | ICD-10-CM | POA: Diagnosis not present

## 2023-12-16 DIAGNOSIS — M816 Localized osteoporosis [Lequesne]: Secondary | ICD-10-CM | POA: Diagnosis not present

## 2023-12-16 LAB — HM MAMMOGRAPHY

## 2023-12-16 LAB — HM DEXA SCAN

## 2024-01-10 DIAGNOSIS — R5381 Other malaise: Secondary | ICD-10-CM | POA: Diagnosis not present

## 2024-01-10 DIAGNOSIS — R42 Dizziness and giddiness: Secondary | ICD-10-CM | POA: Diagnosis not present

## 2024-01-13 DIAGNOSIS — R42 Dizziness and giddiness: Secondary | ICD-10-CM | POA: Diagnosis not present

## 2024-01-13 DIAGNOSIS — J9811 Atelectasis: Secondary | ICD-10-CM | POA: Diagnosis not present

## 2024-01-13 DIAGNOSIS — R0602 Shortness of breath: Secondary | ICD-10-CM | POA: Diagnosis not present

## 2024-01-13 DIAGNOSIS — R079 Chest pain, unspecified: Secondary | ICD-10-CM | POA: Diagnosis not present

## 2024-01-13 DIAGNOSIS — Q790 Congenital diaphragmatic hernia: Secondary | ICD-10-CM | POA: Diagnosis not present

## 2024-01-13 DIAGNOSIS — J438 Other emphysema: Secondary | ICD-10-CM | POA: Diagnosis not present

## 2024-01-25 ENCOUNTER — Ambulatory Visit: Payer: Self-pay | Admitting: *Deleted

## 2024-01-25 NOTE — Telephone Encounter (Signed)
 Copied from CRM [id number]. 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 Leg pain, right leg worse than left leg Reason for Disposition  [1] MODERATE pain (e.g., interferes with normal activities, limping) AND [2] present > 3 days  Answer Assessment - Initial Assessment Questions 1. ONSET: When did the pain start?      Pt is not established with Radiographer, therapeutic.  All my doctors are in [place].   I need a local doctor in [place].  Someone told me about you guys so that's why I'm calling.    My lower legs started with itching.  My PCP in [place] gave me salve but it's getting worse and it's spreading now.   It wakes me up during the night.  2. LOCATION: Where is the pain located?      It's from my knees down.   There is an area about the size of my hand on my leg.   My right leg is worse than my left leg.    My PCP told me to go to a dermatologist.    I went to one here in [place].  He said it's sun damage and my age.  I told him it was not sun damage.  The reason is because I worked in a [employer] for many years.   I told the dermatologist why isn't this all over my body?   I tanned in the nude.   No one has given me any answers.   It's painful and I try to keep my legs propped up.  I just need a new doctor that is local here in [place]. 3. PAIN: How bad is the pain?    (Scale 1-10; or mild, moderate, severe)     It's getting worse.  4. WORK OR EXERCISE: Has there been any recent work or exercise that involved this part of the body?      See above 5. CAUSE: What do you think is causing the leg pain?     I don't know what it is.   Total itching and popping up has been 3 yrs and getting worse.  It's spreading and getting worse. 6. OTHER SYMPTOMS:  Do you have any other symptoms? (e.g., chest pain, back pain, breathing difficulty, swelling, rash, fever, numbness, weakness)     No 7. PREGNANCY: Is there any chance you are pregnant? When was your last menstrual period?     N/A  Protocols used: Leg Pain-A-AH FYI Only or Action Required?: FYI only for provider.  Patient was last seen in primary care on Establishing care with Dr. Dottie.   Wanted a local PCP.  Been seeing a PCP in [place].S[facility] Nurse Triage reporting Leg Pain. For past 3 yrs having bilateral leg pain, itching and scabbing.  Saw dermatologist for this in the past.    Symptoms began several years ago.  Interventions attempted: Prescription medications: from dermatologist and former PCP.  Symptoms are: gradually worsening.  Triage Disposition: See PCP When Office is Open (Within 3 Days)  Patient/caregiver understands and will follow disposition?: Yes New pt appt set up with Dr. Norleen Dottie at [facility] for this Friday.

## 2024-01-27 ENCOUNTER — Ambulatory Visit (HOSPITAL_BASED_OUTPATIENT_CLINIC_OR_DEPARTMENT_OTHER): Admitting: Family Medicine

## 2024-01-27 ENCOUNTER — Encounter: Payer: Self-pay | Admitting: Advanced Practice Midwife

## 2024-01-27 ENCOUNTER — Encounter (HOSPITAL_BASED_OUTPATIENT_CLINIC_OR_DEPARTMENT_OTHER): Payer: Self-pay | Admitting: Family Medicine

## 2024-01-27 VITALS — BP 126/74 | HR 62 | Temp 97.9°F | Resp 17 | Ht 59.84 in | Wt 114.2 lb

## 2024-01-27 DIAGNOSIS — R233 Spontaneous ecchymoses: Secondary | ICD-10-CM | POA: Diagnosis not present

## 2024-01-27 DIAGNOSIS — M79604 Pain in right leg: Secondary | ICD-10-CM

## 2024-01-27 DIAGNOSIS — L989 Disorder of the skin and subcutaneous tissue, unspecified: Secondary | ICD-10-CM | POA: Diagnosis not present

## 2024-01-27 DIAGNOSIS — M79605 Pain in left leg: Secondary | ICD-10-CM

## 2024-01-27 DIAGNOSIS — R5382 Chronic fatigue, unspecified: Secondary | ICD-10-CM

## 2024-01-27 HISTORY — DX: Chronic fatigue, unspecified: R53.82

## 2024-01-27 NOTE — Progress Notes (Signed)
 Established Patient Office Visit  Subjective   Patient ID: Sarah Pena, female    DOB: [birth year]  Age: 83 y.o. MRN: [id number]  Chief Complaint  Patient presents with   Establish Care    Establish care     F/u as above.  New to my practice.  She is frustrated with somewhat extensive bruising and discoloration along both of her legs.  Occasional bilateral discomfort in her legs, mostly at night.  Does get at least transient relief when she gets up and bears weight.  Has previously seen Dr. Kendell at [facility] dermatology for her legs.    Past Medical History:  Diagnosis Date   Asthma 05/2015   CAD (coronary artery disease) of bypass graft VG to RCA 08/06/2021   f/by Dr. Bernie   Closed fracture of greater tuberosity of humerus 07/21/2018   COPD (chronic obstructive pulmonary disease) (HCC)    Former smoker 07/27/2019   50 pack year history until 2015   History of kidney stones    Hx of adenomatous colonic polyps 04/23/2019   Melanoma (HCC)    f/by [facility]   Osteoporosis 06/24/2020   Pure hypercholesterolemia 06/24/2020   f/by [facility]   Solitary pulmonary nodule 03/12/2015   -noted 2009 CT chest 05/2016 >Small pulmonary nodules in both lungs are unchanged from previous exam. The largest is in the posterior left lower lobe measuring 5mm.   Vitamin D  deficiency 06/24/2020    Outpatient Encounter Medications as of 01/27/2024  Medication Sig   albuterol  (PROAIR  HFA) 108 (90 Base) MCG/ACT inhaler Inhale 2 puffs into the lungs every 6 (six) hours as needed for wheezing or shortness of breath.   aspirin  81 MG tablet Take 81 mg by mouth at bedtime.   calcium  carbonate (TUMS - DOSED IN MG ELEMENTAL CALCIUM ) 500 MG chewable tablet Chew 1 tablet by mouth daily as needed for indigestion or heartburn.   Cholecalciferol  (VITAMIN D -3) 1000 UNITS CAPS Take 1,000 Units by mouth daily.    ezetimibe  (ZETIA ) 10 MG tablet Take 1 tablet (10 mg total) by mouth daily.    Fluticasone -Umeclidin-Vilant (TRELEGY ELLIPTA ) 100-62.5-25 MCG/ACT AEPB Inhale 1 puff into the lungs daily.   metoprolol  tartrate (LOPRESSOR ) 25 MG tablet Take 1 tablet (25 mg total) by mouth 2 (two) times daily.   nitroGLYCERIN  (NITROSTAT ) 0.4 MG SL tablet Place 1 tablet (0.4 mg total) under the tongue every 5 (five) minutes x 3 doses as needed for chest pain.   [DISCONTINUED] Dextrose -Fructose-Sod Citrate 968-175-230 MG CHEW Chew 2 tablets by mouth as needed (nausea).    [DISCONTINUED] Elderberry 575 MG/5ML SYRP Take 15 mLs by mouth daily.   [DISCONTINUED] Magnesium  250 MG TABS Take 250 mg by mouth 2 (two) times daily.   [DISCONTINUED] TURMERIC PO Take 1 capsule by mouth daily.    Probiotic Product (MISC INTESTINAL FLORA REGULAT) CAPS    [DISCONTINUED] Magnesium  Citrate (CITROMA PO) Take 250 capsules by mouth in the morning and at bedtime.   [DISCONTINUED] OIL OF OREGANO PO Take 1 capsule by mouth daily.  (Patient not taking: Reported on 01/27/2024)   [DISCONTINUED] OVER THE COUNTER MEDICATION Place 6 drops under the tongue 2 (two) times daily. CBD oil   [DISCONTINUED] Potassium 99 MG TABS Take 99 mg by mouth once.   No facility-administered encounter medications on file as of 01/27/2024.    Social History   Tobacco Use   Smoking status: Former    Current packs/day: 0.00    Average packs/day: 1 pack/day  for 50.0 years (50.0 ttl pk-yrs)    Types: Cigarettes    Start date: 05/22/1964    Quit date: 05/22/2014    Years since quitting: 9.6   Smokeless tobacco: Never   Tobacco comments:    quit 05/2014  Vaping Use   Vaping status: Never Used  Substance Use Topics   Alcohol use: Yes    Alcohol/week: 0.0 standard drinks of alcohol    Comment: rarely   Drug use: No      Review of Systems  Constitutional:  Negative for diaphoresis, fever, malaise/fatigue and weight loss.  Respiratory:  Negative for cough, shortness of breath and wheezing.   Cardiovascular:  Negative for chest pain,  palpitations, orthopnea, claudication, leg swelling and PND.      Objective:     BP 126/74 (BP Location: Right Arm, Patient Position: Standing, Cuff Size: Normal)   Pulse 62   Temp 97.9 F (36.6 C) (Oral)   Resp 17   Ht 4' 11.84 (1.52 m)   Wt 114 lb 3.2 oz (51.8 kg)   SpO2 93%   BMI 22.42 kg/m    Physical Exam Constitutional:      General: She is not in acute distress.    Appearance: Normal appearance.  HENT:     Head: Normocephalic.  Neck:     Vascular: No carotid bruit.  Cardiovascular:     Rate and Rhythm: Normal rate and regular rhythm.     Pulses: Normal pulses.     Heart sounds: Normal heart sounds.  Pulmonary:     Effort: Pulmonary effort is normal.     Breath sounds: Normal breath sounds.  Abdominal:     General: Bowel sounds are normal.     Palpations: Abdomen is soft.  Musculoskeletal:     Cervical back: Neck supple. No tenderness.     Right lower leg: No edema.     Left lower leg: No edema.  Skin:    Comments: Legs with extensive hyperpigmentation and occasional echymoses.  Neurological:     Mental Status: She is alert.      No results found for any visits on 01/27/24.    The ASCVD Risk score (Arnett DK, et al., 2019) failed to calculate for the following reasons:   The 2019 ASCVD risk score is only valid for ages 24 to 38   Risk score cannot be calculated because patient has a medical history suggesting prior/existing ASCVD    Assessment & Plan:  Chronic fatigue Assessment & Plan: Await labs and f/u with her soon.  Her skin has thinned from chronic sun exposure and tobacco abuse.  She requests a different dermatology opinion.  A skin biopsy may be worthwhile.  Evaluate for possible Restless legs.  F/u next month as we continue to get to know her.  Orders: -     Comprehensive metabolic panel with GFR -     Vitamin B12  Easy bruisability -     CBC with Differential/Platelet -     PT and PTT  Leg pain, bilateral -     Ferritin  Skin  lesion of left lower extremity -     Ambulatory referral to Dermatology    Return in about 4 weeks (around 02/24/2024) for chronic follow-up.    REDDING PONCE NORLEEN FALCON., MD

## 2024-01-27 NOTE — Assessment & Plan Note (Signed)
 Await labs and f/u with her soon.  Her skin has thinned from chronic sun exposure and tobacco abuse.  She requests a different dermatology opinion.  A skin biopsy may be worthwhile.  Evaluate for possible Restless legs.  F/u next month as we continue to get to know her.

## 2024-01-28 LAB — CBC WITH DIFFERENTIAL/PLATELET
Basophils Absolute: 0.1 x10E3/uL (ref 0.0–0.2)
Basos: 1 %
EOS (ABSOLUTE): 0.2 x10E3/uL (ref 0.0–0.4)
Eos: 3 %
Hematocrit: 44.1 % (ref 34.0–46.6)
Hemoglobin: 14.4 g/dL (ref 11.1–15.9)
Immature Grans (Abs): 0 x10E3/uL (ref 0.0–0.1)
Immature Granulocytes: 0 %
Lymphocytes Absolute: 2.1 x10E3/uL (ref 0.7–3.1)
Lymphs: 26 %
MCH: 31.1 pg (ref 26.6–33.0)
MCHC: 32.7 g/dL (ref 31.5–35.7)
MCV: 95 fL (ref 79–97)
Monocytes Absolute: 0.9 x10E3/uL (ref 0.1–0.9)
Monocytes: 11 %
Neutrophils Absolute: 4.9 x10E3/uL (ref 1.4–7.0)
Neutrophils: 59 %
Platelets: 251 x10E3/uL (ref 150–450)
RBC: 4.63 x10E6/uL (ref 3.77–5.28)
RDW: 13.1 % (ref 11.7–15.4)
WBC: 8.3 x10E3/uL (ref 3.4–10.8)

## 2024-01-28 LAB — COMPREHENSIVE METABOLIC PANEL WITH GFR
ALT: 21 IU/L (ref 0–32)
AST: 28 IU/L (ref 0–40)
Albumin: 4.2 g/dL (ref 3.7–4.7)
Alkaline Phosphatase: 88 IU/L (ref 44–121)
BUN/Creatinine Ratio: 25 (ref 12–28)
BUN: 19 mg/dL (ref 8–27)
Bilirubin Total: 0.2 mg/dL (ref 0.0–1.2)
CO2: 21 mmol/L (ref 20–29)
Calcium: 9.6 mg/dL (ref 8.7–10.3)
Chloride: 104 mmol/L (ref 96–106)
Creatinine, Ser: 0.75 mg/dL (ref 0.57–1.00)
Globulin, Total: 2.7 g/dL (ref 1.5–4.5)
Glucose: 92 mg/dL (ref 70–99)
Potassium: 4.9 mmol/L (ref 3.5–5.2)
Sodium: 139 mmol/L (ref 134–144)
Total Protein: 6.9 g/dL (ref 6.0–8.5)
eGFR: 80 mL/min/1.73 (ref >59)

## 2024-01-28 LAB — FERRITIN: Ferritin: 72 ng/mL (ref 15–150)

## 2024-01-28 LAB — PT AND PTT
INR: 0.9 (ref 0.9–1.2)
Prothrombin Time: 10.2 s (ref 9.1–12.0)
aPTT: 25 s (ref 24–33)

## 2024-01-28 LAB — VITAMIN B12: Vitamin B-12: 1059 pg/mL (ref 232–1245)

## 2024-01-30 ENCOUNTER — Ambulatory Visit (HOSPITAL_BASED_OUTPATIENT_CLINIC_OR_DEPARTMENT_OTHER): Payer: Self-pay | Admitting: Family Medicine

## 2024-02-22 DIAGNOSIS — Z87442 Personal history of urinary calculi: Secondary | ICD-10-CM | POA: Insufficient documentation

## 2024-02-23 ENCOUNTER — Ambulatory Visit: Admitting: Cardiology

## 2024-02-24 ENCOUNTER — Encounter (HOSPITAL_BASED_OUTPATIENT_CLINIC_OR_DEPARTMENT_OTHER): Payer: Self-pay | Admitting: Family Medicine

## 2024-02-24 ENCOUNTER — Encounter (HOSPITAL_BASED_OUTPATIENT_CLINIC_OR_DEPARTMENT_OTHER): Payer: Self-pay

## 2024-02-24 ENCOUNTER — Ambulatory Visit (HOSPITAL_BASED_OUTPATIENT_CLINIC_OR_DEPARTMENT_OTHER): Admitting: Family Medicine

## 2024-02-24 VITALS — BP 146/74 | HR 59 | Temp 98.3°F | Resp 16 | Ht 59.0 in | Wt 114.7 lb

## 2024-02-24 DIAGNOSIS — I25810 Atherosclerosis of coronary artery bypass graft(s) without angina pectoris: Secondary | ICD-10-CM

## 2024-02-24 DIAGNOSIS — J439 Emphysema, unspecified: Secondary | ICD-10-CM | POA: Diagnosis not present

## 2024-02-24 DIAGNOSIS — I10 Essential (primary) hypertension: Secondary | ICD-10-CM | POA: Diagnosis not present

## 2024-02-24 DIAGNOSIS — G2581 Restless legs syndrome: Secondary | ICD-10-CM | POA: Diagnosis not present

## 2024-02-24 MED ORDER — GABAPENTIN 100 MG PO CAPS: 100.0000 mg | ORAL_CAPSULE | Freq: Every day | ORAL | 3 refills | Status: AC

## 2024-02-24 NOTE — Progress Notes (Signed)
 Established Patient Office Visit  Subjective   Patient ID: Sarah Pena, female    DOB: 09/11/1940  Age: 83 y.o. MRN: [id number]  Chief Complaint  Patient presents with   Medical Management of Chronic Issues    follow up     F/u as above.  Please see last note for details.  She would like assistance with her RLS instead of using an OTC remedy.    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 history of CABG 08/18/2017   f/by [facility]   Former smoker 07/27/2019   50 pack year history until 2015   GERD (gastroesophageal reflux disease) 08/11/2017   History of kidney stones    Hypertension 06/24/2020   Melanoma (HCC)    f/by [facility]   Osteoporosis 06/24/2020   f/by her GYN   Pure hypercholesterolemia 06/24/2020   f/by [facility]   Vitamin D  deficiency 06/24/2020    Outpatient Encounter Medications as of 02/24/2024  Medication Sig   albuterol  (PROAIR  HFA) 108 (90 Base) MCG/ACT inhaler Inhale 2 puffs into the lungs every 6 (six) hours as needed for wheezing or shortness of breath.   aspirin  81 MG tablet Take 81 mg by mouth at bedtime.   calcium  carbonate (TUMS - DOSED IN MG ELEMENTAL CALCIUM ) 500 MG chewable tablet Chew 1 tablet by mouth daily as needed for indigestion or heartburn.   Cholecalciferol  (VITAMIN D -3) 1000 UNITS CAPS Take 1,000 Units by mouth daily.    ezetimibe  (ZETIA ) 10 MG tablet Take 1 tablet (10 mg total) by mouth daily.   Fluticasone -Umeclidin-Vilant (TRELEGY ELLIPTA ) 100-62.5-25 MCG/ACT AEPB Inhale 1 puff into the lungs daily.   gabapentin  (NEURONTIN ) 100 MG capsule Take 1 capsule (100 mg total) by mouth at bedtime.   metoprolol  tartrate (LOPRESSOR ) 25 MG tablet Take 1 tablet (25 mg total) by mouth 2 (two) times daily.   nitroGLYCERIN  (NITROSTAT ) 0.4 MG SL tablet Place 1 tablet (0.4 mg total) under the tongue  every 5 (five) minutes x 3 doses as needed for chest pain.   Probiotic Product (MISC INTESTINAL FLORA REGULAT) CAPS    No facility-administered encounter medications on file as of 02/24/2024.    Social History   Tobacco Use   Smoking status: Former    Current packs/day: 0.00    Average packs/day: 1 pack/day for 50.0 years (50.0 ttl pk-yrs)    Types: Cigarettes    Start date: 05/22/1964    Quit date: 05/22/2014    Years since quitting: 9.7    Passive exposure: Past   Smokeless tobacco: Never   Tobacco comments:    quit 05/2014  Vaping Use   Vaping status: Never Used  Substance Use Topics   Alcohol use: Yes    Alcohol/week: 0.0 standard drinks of alcohol    Comment: rarely   Drug use: No      Review of Systems  Constitutional:  Negative for diaphoresis, fever, malaise/fatigue and weight loss.  Respiratory:  Negative for cough, shortness of breath and wheezing.   Cardiovascular:  Negative for chest pain, palpitations, orthopnea, claudication, leg swelling and PND.      Objective:     BP (!) 146/74   Pulse (!) 59   Temp 98.3 F (36.8 C) (Oral)   Resp 16   Ht 4' 11 (1.499 m)   Wt 114 lb 11.2 oz (52 kg)  SpO2 93%   BMI 23.17 kg/m    Physical Exam Constitutional:      General: She is not in acute distress.    Appearance: Normal appearance.  HENT:     Head: Normocephalic.  Neck:     Vascular: No carotid bruit.  Cardiovascular:     Rate and Rhythm: Normal rate and regular rhythm.     Pulses: Normal pulses.     Heart sounds: Normal heart sounds.  Pulmonary:     Effort: Pulmonary effort is normal.     Breath sounds: Normal breath sounds.  Abdominal:     General: Bowel sounds are normal.     Palpations: Abdomen is soft.  Musculoskeletal:     Cervical back: Neck supple. No tenderness.     Right lower leg: No edema.     Left lower leg: No edema.  Neurological:     Mental Status: She is alert.      No results found for any visits on  02/24/24.    The ASCVD Risk score (Arnett DK, et al., 2019) failed to calculate for the following reasons:   The 2019 ASCVD risk score is only valid for ages 90 to 24   Risk score cannot be calculated because patient has a medical history suggesting prior/existing ASCVD    Assessment & Plan:  Restless legs syndrome Assessment & Plan: Trial of Gabapentin  and she will see how she like it.  Obviously may need to increase the dosage in time.  Orders: -     Gabapentin ; Take 1 capsule (100 mg total) by mouth at bedtime.  Dispense: 30 capsule; Refill: 3  Primary hypertension Assessment & Plan: Fair control.  Likely white coat component.  Will follow.   Pulmonary emphysema, unspecified emphysema type (HCC)  Coronary artery disease involving coronary bypass graft of native heart, unspecified whether angina present    Return in about 4 weeks (around 03/23/2024) for chronic follow-up.    REDDING PONCE NORLEEN FALCON., MD

## 2024-02-24 NOTE — Assessment & Plan Note (Signed)
 Fair control.  Likely white coat component.  Will follow.

## 2024-02-24 NOTE — Assessment & Plan Note (Signed)
 Trial of Gabapentin  and she will see how she like it.  Obviously may need to increase the dosage in time.

## 2024-03-06 ENCOUNTER — Telehealth (HOSPITAL_BASED_OUTPATIENT_CLINIC_OR_DEPARTMENT_OTHER): Payer: Self-pay

## 2024-03-06 ENCOUNTER — Encounter (HOSPITAL_BASED_OUTPATIENT_CLINIC_OR_DEPARTMENT_OTHER): Payer: Self-pay

## 2024-03-06 NOTE — Telephone Encounter (Unsigned)
 Copied from CRM #[id number]. Topic: Referral - Question >> Mar 06, 2024  9:03 AM Carlatta H wrote: Reason for CRM: Patient has not heard from Dermatologist since referral was sent please advise

## 2024-03-06 NOTE — Telephone Encounter (Signed)
 Pt said she has not heard back. Does not want to go to [facility] dermatology. Please make sure referral was sent to [facility].

## 2024-03-23 ENCOUNTER — Ambulatory Visit (HOSPITAL_BASED_OUTPATIENT_CLINIC_OR_DEPARTMENT_OTHER): Admitting: Family Medicine

## 2024-03-25 ENCOUNTER — Encounter (HOSPITAL_BASED_OUTPATIENT_CLINIC_OR_DEPARTMENT_OTHER): Payer: Self-pay

## 2024-03-25 ENCOUNTER — Ambulatory Visit (HOSPITAL_BASED_OUTPATIENT_CLINIC_OR_DEPARTMENT_OTHER)
Admission: EM | Admit: 2024-03-25 | Discharge: 2024-03-25 | Disposition: A | Discharge status: Home / Self Care | Attending: Family Medicine | Admitting: Family Medicine

## 2024-03-25 DIAGNOSIS — S81801A Unspecified open wound, right lower leg, initial encounter: Secondary | ICD-10-CM

## 2024-03-25 DIAGNOSIS — L03115 Cellulitis of right lower limb: Secondary | ICD-10-CM | POA: Diagnosis not present

## 2024-03-25 MED ORDER — DOXYCYCLINE HYCLATE 100 MG PO CAPS: 100.0000 mg | ORAL_CAPSULE | Freq: Two times a day (BID) | ORAL | 0 refills | Status: AC

## 2024-03-25 NOTE — ED Provider Notes (Signed)
 Sarah Pena    CSN: [id number] Arrival date & time: 03/25/24  1335      History   Chief Complaint Chief Complaint  Patient presents with   Leg Pain    HPI Sarah Pena is a 83 y.o. female.   Patient is a 64-year-old female who presents today with right lower extremity pain, erythema and swelling.  Patient bumped lower right leg on chair leg. Wound created and now having swelling and redness to lower extremity and foot. Patient then dropped a cell phone on the back of her right lower leg and now has a wound there. No bleeding, no drainage. +swelling to lower extremity and pain.    Leg Pain   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 history of CABG 08/18/2017   f/by [facility]   Former smoker 07/27/2019   50 pack year history until 2015   GERD (gastroesophageal reflux disease) 08/11/2017   History of kidney stones    Hypertension 06/24/2020   Melanoma (HCC)    f/by [facility]   Osteoporosis 06/24/2020   f/by her GYN   Pure hypercholesterolemia 06/24/2020   f/by [facility]   Vitamin D  deficiency 06/24/2020    Patient Active Problem List   Diagnosis Date Noted   Restless legs syndrome 02/24/2024   History of kidney stones    Chronic fatigue 01/27/2024   CAD (coronary artery disease) of bypass graft VG to RCA 08/06/2021   S/P PTCA (percutaneous transluminal coronary angioplasty) prox RCA 08/05/21 08/06/2021   Unstable angina (HCC) 08/04/2021   Pain of left hip joint 10/24/2020   History of total abdominal hysterectomy 07/17/2020   History of cholecystectomy 07/17/2020   Melanoma (HCC) 07/17/2020   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/24/2020   History of chest pain 06/24/2020   Hypertension 06/24/2020   Impaired fasting glucose 06/24/2020   Osteoporosis 06/24/2020   Pure hypercholesterolemia 06/24/2020    Vitamin D  deficiency 06/24/2020   H/O: hysterectomy 11/29/2019   Healthcare maintenance 07/27/2019   Former smoker 07/27/2019   Hx of adenomatous colonic polyps 04/23/2019   Closed fracture of greater tuberosity of humerus 07/21/2018   Stiffness of right shoulder joint 06/27/2018   Shoulder pain 04/12/2018   Trigger thumb of left hand 12/29/2017   Hx of CABG X 3  2019 08/19/2017   Coronary artery disease with history of myocardial infarction without history of CABG 08/18/2017   GERD (gastroesophageal reflux disease) 08/11/2017   Asthma 05/2015   Solitary pulmonary nodule 03/12/2015   Chest pain 08/06/2014   COPD (chronic obstructive pulmonary disease) (HCC) 08/06/2014    Past Surgical History:  Procedure Laterality Date   APPENDECTOMY     CHOLECYSTECTOMY N/A 10/29/2014   Procedure: LAPAROSCOPIC CHOLECYSTECTOMY WITH INTRAOPERATIVE CHOLANGIOGRAM;  Surgeon: Krystal Russell, MD;  Location: [facility] OR;  Service: General;  Laterality: N/A;   COLONOSCOPY N/A    circa 2023 in [place]   CORONARY ARTERY BYPASS GRAFT N/A 08/18/2017   Procedure: CORONARY ARTERY BYPASS GRAFTING (CABG) x 3, USING LEFT INTERNAL MAMMARY ARTERY AND RIGHT GREATER SAPHENOUS VEIN HARVESTED ENDOSCOPICALLY;  Surgeon: Kerrin Elspeth JAYSON, MD;  Location: [facility] OR;  Service: Open Heart Surgery;  Laterality: N/A;   CORONARY BALLOON ANGIOPLASTY N/A 08/05/2021   Procedure: CORONARY BALLOON ANGIOPLASTY;  Surgeon: Burnard Debby LABOR, MD;  Location: [facility];  Service: Cardiovascular;  Laterality: N/A;  EYE SURGERY Bilateral    catarct with lens replacement   HERNIA REPAIR     RIH   LEFT HEART CATH AND CORONARY ANGIOGRAPHY N/A 08/18/2017   Procedure: LEFT HEART CATH AND CORONARY ANGIOGRAPHY;  Surgeon: Court Dorn PARAS, MD;  Location: [facility];  Service: Cardiovascular;  Laterality: N/A;   LEFT HEART CATH AND CORS/GRAFTS ANGIOGRAPHY N/A 08/05/2021   Procedure: LEFT HEART CATH AND CORS/GRAFTS ANGIOGRAPHY;  Surgeon:  Burnard Debby LABOR, MD;  Location: [facility];  Service: Cardiovascular;  Laterality: N/A;   lipoma removed  1993   RLQ area   MELANOMA EXCISION     TEE WITHOUT CARDIOVERSION N/A 08/18/2017   Procedure: TRANSESOPHAGEAL ECHOCARDIOGRAM (TEE);  Surgeon: Kerrin Elspeth BROCKS, MD;  Location: [facility] OR;  Service: Open Heart Surgery;  Laterality: N/A;   TUBAL LIGATION      OB History   No obstetric history on file.      Home Medications    Prior to Admission medications   Medication Sig Start Date End Date Taking? Authorizing Provider  doxycycline  (VIBRAMYCIN ) 100 MG capsule Take 1 capsule (100 mg total) by mouth 2 (two) times daily. 03/25/24  Yes Annamay Laymon A, FNP  albuterol  (PROAIR  HFA) 108 (90 Base) MCG/ACT inhaler Inhale 2 puffs into the lungs every 6 (six) hours as needed for wheezing or shortness of breath. 10/20/21   Jude Harden GAILS, MD  aspirin  81 MG tablet Take 81 mg by mouth at bedtime.    [provider]  calcium  carbonate (TUMS - DOSED IN MG ELEMENTAL CALCIUM ) 500 MG chewable tablet Chew 1 tablet by mouth daily as needed for indigestion or heartburn.    [provider]  Cholecalciferol  (VITAMIN D -3) 1000 UNITS CAPS Take 1,000 Units by mouth daily.     [provider]  ezetimibe  (ZETIA ) 10 MG tablet Take 1 tablet (10 mg total) by mouth daily. 09/06/23   Krasowski, Robert J, MD  Fluticasone -Umeclidin-Vilant (TRELEGY ELLIPTA ) 100-62.5-25 MCG/ACT AEPB Inhale 1 puff into the lungs daily. 11/24/23   Jude Harden GAILS, MD  gabapentin  (NEURONTIN ) 100 MG capsule Take 1 capsule (100 mg total) by mouth at bedtime. 02/24/24   Dottie Norleen PHEBE PONCE, MD  metoprolol  tartrate (LOPRESSOR ) 25 MG tablet Take 1 tablet (25 mg total) by mouth 2 (two) times daily. 06/21/23   Carlin Delon BROCKS, NP  nitroGLYCERIN  (NITROSTAT ) 0.4 MG SL tablet Place 1 tablet (0.4 mg total) under the tongue every 5 (five) minutes x 3 doses as needed for chest pain. 08/06/21   Marylu Leita SAUNDERS, NP  Probiotic  Product (MISC INTESTINAL FLORA REGULAT) CAPS     [provider]    Family History Family History  Problem Relation Age of Onset   Heart disease Mother    Heart disease Father    Cancer Maternal Grandfather    Colon polyps Neg Hx    Colon cancer Neg Hx    Esophageal cancer Neg Hx    Stomach cancer Neg Hx    Rectal cancer Neg Hx     Social History Social History   Tobacco Use   Smoking status: Former    Current packs/day: 0.00    Average packs/day: 1 pack/day for 50.0 years (50.0 ttl pk-yrs)    Types: Cigarettes    Start date: 05/22/1964    Quit date: 05/22/2014    Years since quitting: 9.8    Passive exposure: Past   Smokeless tobacco: Never   Tobacco comments:  quit 05/2014  Vaping Use   Vaping status: Never Used  Substance Use Topics   Alcohol use: Yes    Alcohol/week: 0.0 standard drinks of alcohol    Comment: rarely   Drug use: No     Allergies   Codeine, Crestor [rosuvastatin], Ibandronate, Lipitor [atorvastatin ], and Statins   Review of Systems Review of Systems See HPI  Physical Exam Triage Vital Signs ED Triage Vitals  Encounter Vitals Group     BP 03/25/24 1504 129/75     Girls Systolic BP Percentile --      Girls Diastolic BP Percentile --      Boys Systolic BP Percentile --      Boys Diastolic BP Percentile --      Pulse Rate 03/25/24 1504 68     Resp 03/25/24 1504 20     Temp 03/25/24 1504 98.6 F (37 C)     Temp Source 03/25/24 1504 Oral     SpO2 03/25/24 1504 95 %     Weight --      Height --      Head Circumference --      Peak Flow --      Pain Score 03/25/24 1506 0     Pain Loc --      Pain Education --      Exclude from Growth Chart --    No data found.  Updated Vital Signs BP 129/75 (BP Location: Right Arm)   Pulse 68   Temp 98.6 F (37 C) (Oral)   Resp 20   SpO2 95%   Visual Acuity Right Eye Distance:   Left Eye Distance:   Bilateral Distance:    Right Eye Near:   Left Eye Near:    Bilateral  Near:     Physical Exam Vitals and nursing note reviewed.  Constitutional:      General: She is not in acute distress.    Appearance: Normal appearance. She is not ill-appearing, toxic-appearing or diaphoretic.  Pulmonary:     Effort: Pulmonary effort is normal.  Skin:    General: Skin is warm and dry.     Comments: Small wound to anterior right lower leg that appears to be healing without any concerns.  Another wound to posterior right lower leg that has surrounding erythema with erythema and swelling into the ankle and foot area. Tender to touch, warm   Neurological:     Mental Status: She is alert.  Psychiatric:        Mood and Affect: Mood normal.      UC Treatments / Results  Labs (all labs ordered are listed, but only abnormal results are displayed) Labs Reviewed - No data to display  EKG   Radiology No results found.  Procedures Procedures (including critical Pena time)  Medications Ordered in UC Medications - No data to display  Initial Impression / Assessment and Plan / UC Course  I have reviewed the triage vital signs and the nursing notes.  Pertinent labs & imaging results that were available during my Pena of the patient were reviewed by me and considered in my medical decision making (see chart for details).     Right lower extremity wound with cellulitis.  Treating with doxycycline .  Recommend keep the area clean and covered if draining. Elevate the leg for swelling.  Follow-up as needed Final Clinical Impressions(s) / UC Diagnoses   Final diagnoses:  Leg wound, right, initial encounter  Cellulitis of right lower extremity  Discharge Instructions      I am treating you for infection of the wound and cellulitis Take the antibiotics as prescribed.  Recommend keep the area clean and dry as possible.  Cover if draining. Follow-up for any continued or worsening issues     ED Prescriptions     Medication Sig Dispense Auth. Provider    doxycycline  (VIBRAMYCIN ) 100 MG capsule Take 1 capsule (100 mg total) by mouth 2 (two) times daily. 20 capsule Adah Wilbert LABOR, FNP      PDMP not reviewed this encounter.   Adah Wilbert LABOR, FNP 03/25/24 1537

## 2024-03-25 NOTE — Discharge Instructions (Addendum)
 I am treating you for infection of the wound and cellulitis Take the antibiotics as prescribed.  Recommend keep the area clean and dry as possible.  Cover if draining. Follow-up for any continued or worsening issues

## 2024-03-25 NOTE — ED Triage Notes (Signed)
 Patient bumped lower right leg on chair leg. Wound created and now having swelling and redness to lower extremity and foot. Patient then dropped a cell phone on the back of her right lower leg and now has a wound there. No bleeding, no drainage. +swelling to lower extremity and pain.

## 2024-04-01 DIAGNOSIS — R001 Bradycardia, unspecified: Secondary | ICD-10-CM | POA: Diagnosis not present

## 2024-04-01 DIAGNOSIS — I4510 Unspecified right bundle-branch block: Secondary | ICD-10-CM | POA: Diagnosis not present

## 2024-04-01 DIAGNOSIS — J438 Other emphysema: Secondary | ICD-10-CM | POA: Diagnosis not present

## 2024-04-01 DIAGNOSIS — R079 Chest pain, unspecified: Secondary | ICD-10-CM | POA: Diagnosis not present

## 2024-04-01 DIAGNOSIS — I10 Essential (primary) hypertension: Secondary | ICD-10-CM | POA: Diagnosis not present

## 2024-04-01 DIAGNOSIS — K219 Gastro-esophageal reflux disease without esophagitis: Secondary | ICD-10-CM | POA: Diagnosis not present

## 2024-04-01 DIAGNOSIS — Z743 Need for continuous supervision: Secondary | ICD-10-CM | POA: Diagnosis not present

## 2024-04-01 DIAGNOSIS — Z951 Presence of aortocoronary bypass graft: Secondary | ICD-10-CM | POA: Diagnosis not present

## 2024-04-01 DIAGNOSIS — I2510 Atherosclerotic heart disease of native coronary artery without angina pectoris: Secondary | ICD-10-CM | POA: Diagnosis not present

## 2024-04-01 DIAGNOSIS — Z79899 Other long term (current) drug therapy: Secondary | ICD-10-CM | POA: Diagnosis not present

## 2024-04-02 DIAGNOSIS — R079 Chest pain, unspecified: Secondary | ICD-10-CM | POA: Diagnosis not present

## 2024-04-02 DIAGNOSIS — Z951 Presence of aortocoronary bypass graft: Secondary | ICD-10-CM | POA: Diagnosis not present

## 2024-04-02 DIAGNOSIS — I2510 Atherosclerotic heart disease of native coronary artery without angina pectoris: Secondary | ICD-10-CM | POA: Diagnosis not present

## 2024-04-03 DIAGNOSIS — I361 Nonrheumatic tricuspid (valve) insufficiency: Secondary | ICD-10-CM | POA: Diagnosis not present

## 2024-04-03 DIAGNOSIS — I351 Nonrheumatic aortic (valve) insufficiency: Secondary | ICD-10-CM | POA: Diagnosis not present

## 2024-04-03 DIAGNOSIS — R079 Chest pain, unspecified: Secondary | ICD-10-CM | POA: Diagnosis not present

## 2024-04-09 ENCOUNTER — Ambulatory Visit: Attending: Cardiology | Admitting: Cardiology

## 2024-04-09 ENCOUNTER — Encounter: Payer: Self-pay | Admitting: Cardiology

## 2024-04-09 VITALS — BP 100/60 | HR 63 | Ht 59.0 in | Wt 113.0 lb

## 2024-04-09 DIAGNOSIS — J439 Emphysema, unspecified: Secondary | ICD-10-CM | POA: Diagnosis not present

## 2024-04-09 DIAGNOSIS — I2510 Atherosclerotic heart disease of native coronary artery without angina pectoris: Secondary | ICD-10-CM | POA: Diagnosis not present

## 2024-04-09 DIAGNOSIS — Z951 Presence of aortocoronary bypass graft: Secondary | ICD-10-CM | POA: Diagnosis not present

## 2024-04-09 DIAGNOSIS — I252 Old myocardial infarction: Secondary | ICD-10-CM | POA: Diagnosis not present

## 2024-04-09 DIAGNOSIS — I10 Essential (primary) hypertension: Secondary | ICD-10-CM

## 2024-04-09 NOTE — Patient Instructions (Signed)

## 2024-04-09 NOTE — Progress Notes (Unsigned)
 Cardiology Office Note:    Date:  04/09/2024   ID:  Sarah Pena, DOB [birth year], MRN [id number]  PCP:  Dottie Norleen PHEBE PONCE, MD  Cardiologist:  Lamar Fitch, MD    Referring MD: Dottie Norleen PHEBE PONCE, MD   Chief Complaint  Patient presents with   Hospitalization Follow-up    History of Present Illness:    Sarah Pena is a 83 y.o. female  with past medical history significant for coronary artery disease in 2019 she had coronary bypass graft x3 with LIMA to LAD SVG to obtuse marginal branch SVG to PDA also past medical history significant for essential hypertension, history of tobacco abuse, dyslipidemia, COPD. In January of 2023 she ended up going to the hospital because of chest pain. She was find to have complete occluded graft going to RCA however we tried to perform PTCA and stenting of the right coronary artery which was very difficult we able to reduce stenosis from 90% to about 75% which resulted in improvement in the flow to TIMI III. Since that time she seems to be doing well. She denies have any chest pain tightness squeezing pressure burning chest. We struggling also with cholesterol-lowering medication she is unable to tolerate anything she was referred to our lipid clinic had a long discussion conversation with pharmacist but elected not to pursue PCSK9 agents she is afraid that when she will get injection she will get sick may be weeks before she starts feeling better. . Comes today 2 months for follow-up.  Recently she had being in the hospital because of atypical chest pain, echocardiogram has been done which showed preserved ejection fraction, she also had a stress test done which showed no evidence of ischemia.  She comes today to my office, she concluded that the reason for her symptomatology was simply too much stress since she is moving to new house.  Since that time no chest pain tightness squeezing pressure in chest  Past Medical History:  Diagnosis Date   Asthma  05/2015   COPD (chronic obstructive pulmonary disease) (HCC)    Coronary artery disease with history of myocardial infarction without history of CABG 08/18/2017   f/by [facility]   Former smoker 07/27/2019   50 pack year history until 2015   GERD (gastroesophageal reflux disease) 08/11/2017   History of kidney stones    Hypertension 06/24/2020   Melanoma (HCC)    f/by [facility]   Osteoporosis 06/24/2020   f/by her GYN   Pure hypercholesterolemia 06/24/2020   f/by [facility]   Vitamin D  deficiency 06/24/2020    Past Surgical History:  Procedure Laterality Date   APPENDECTOMY     CHOLECYSTECTOMY N/A 10/29/2014   Procedure: LAPAROSCOPIC CHOLECYSTECTOMY WITH INTRAOPERATIVE CHOLANGIOGRAM;  Surgeon: Krystal Russell, MD;  Location: [facility] OR;  Service: General;  Laterality: N/A;   COLONOSCOPY N/A    circa 2023 in [place]   CORONARY ARTERY BYPASS GRAFT N/A 08/18/2017   Procedure: CORONARY ARTERY BYPASS GRAFTING (CABG) x 3, USING LEFT INTERNAL MAMMARY ARTERY AND RIGHT GREATER SAPHENOUS VEIN HARVESTED ENDOSCOPICALLY;  Surgeon: Kerrin Elspeth JAYSON, MD;  Location: [facility] OR;  Service: Open Heart Surgery;  Laterality: N/A;   CORONARY BALLOON ANGIOPLASTY N/A 08/05/2021   Procedure: CORONARY BALLOON ANGIOPLASTY;  Surgeon: Burnard Debby LABOR, MD;  Location: [facility];  Service: Cardiovascular;  Laterality: N/A;   EYE SURGERY Bilateral    catarct with lens replacement   HERNIA REPAIR     RIH   LEFT  HEART CATH AND CORONARY ANGIOGRAPHY N/A 08/18/2017   Procedure: LEFT HEART CATH AND CORONARY ANGIOGRAPHY;  Surgeon: Court Dorn PARAS, MD;  Location: [facility];  Service: Cardiovascular;  Laterality: N/A;   LEFT HEART CATH AND CORS/GRAFTS ANGIOGRAPHY N/A 08/05/2021   Procedure: LEFT HEART CATH AND CORS/GRAFTS ANGIOGRAPHY;  Surgeon: Burnard Debby LABOR, MD;  Location: [facility];  Service: Cardiovascular;  Laterality: N/A;   lipoma removed  1993   RLQ area   MELANOMA EXCISION      TEE WITHOUT CARDIOVERSION N/A 08/18/2017   Procedure: TRANSESOPHAGEAL ECHOCARDIOGRAM (TEE);  Surgeon: Kerrin Elspeth BROCKS, MD;  Location: [facility] OR;  Service: Open Heart Surgery;  Laterality: N/A;   TUBAL LIGATION      Current Medications: Current Meds  Medication Sig   albuterol  (PROAIR  HFA) 108 (90 Base) MCG/ACT inhaler Inhale 2 puffs into the lungs every 6 (six) hours as needed for wheezing or shortness of breath.   aspirin  81 MG tablet Take 81 mg by mouth at bedtime.   calcium  carbonate (TUMS - DOSED IN MG ELEMENTAL CALCIUM ) 500 MG chewable tablet Chew 1 tablet by mouth daily as needed for indigestion or heartburn.   Cholecalciferol  (VITAMIN D -3) 1000 UNITS CAPS Take 1,000 Units by mouth daily.    doxycycline  (VIBRAMYCIN ) 100 MG capsule Take 1 capsule (100 mg total) by mouth 2 (two) times daily.   ezetimibe  (ZETIA ) 10 MG tablet Take 1 tablet (10 mg total) by mouth daily.   Fluticasone -Umeclidin-Vilant (TRELEGY ELLIPTA ) 100-62.5-25 MCG/ACT AEPB Inhale 1 puff into the lungs daily.   gabapentin  (NEURONTIN ) 100 MG capsule Take 1 capsule (100 mg total) by mouth at bedtime.   metoprolol  tartrate (LOPRESSOR ) 25 MG tablet Take 1 tablet (25 mg total) by mouth 2 (two) times daily.   nitroGLYCERIN  (NITROSTAT ) 0.4 MG SL tablet Place 1 tablet (0.4 mg total) under the tongue every 5 (five) minutes x 3 doses as needed for chest pain.   pantoprazole  (PROTONIX ) 20 MG tablet Take 20 mg by mouth daily.   Probiotic Product (MISC INTESTINAL FLORA REGULAT) CAPS      Allergies:   Codeine, Crestor [rosuvastatin], Ibandronate, Lipitor [atorvastatin ], and Statins   Social History   Socioeconomic History   Marital status: Married    Spouse name: Not on file   Number of children: Not on file   Years of education: Not on file   Highest education level: Not on file  Occupational History   Occupation: Retired Programmer, systems  Tobacco Use   Smoking status: Former    Current packs/day: 0.00    Average packs/day: 1  pack/day for 50.0 years (50.0 ttl pk-yrs)    Types: Cigarettes    Start date: 05/22/1964    Quit date: 05/22/2014    Years since quitting: 9.8    Passive exposure: Past   Smokeless tobacco: Never   Tobacco comments:    quit 05/2014  Vaping Use   Vaping status: Never Used  Substance and Sexual Activity   Alcohol use: Yes    Alcohol/week: 0.0 standard drinks of alcohol    Comment: rarely   Drug use: No   Sexual activity: Not on file  Other Topics Concern   Not on file  Social History Narrative   Not on file   Social Drivers of Health   Financial Resource Strain: Not on file  Food Insecurity: Not on file  Transportation Needs: Not on file  Physical Activity: Not on file  Stress: Not on file  Social Connections:  Not on file     Family History: The patient's family history includes Cancer in her maternal grandfather; Heart disease in her father and mother. There is no history of Colon polyps, Colon cancer, Esophageal cancer, Stomach cancer, or Rectal cancer. ROS:   Please see the history of present illness.    All 14 point review of systems negative except as described per history of present illness  EKGs/Labs/Other Studies Reviewed:         Recent Labs: 01/27/2024: ALT 21; BUN 19; Creatinine, Ser 0.75; Hemoglobin 14.4; Platelets 251; Potassium 4.9; Sodium 139  Recent Lipid Panel    Component Value Date/Time   CHOL 200 08/06/2021 0553   CHOL 220 (H) 07/31/2019 0848   TRIG 93 08/06/2021 0553   HDL 52 08/06/2021 0553   HDL 70 07/31/2019 0848   CHOLHDL 3.8 08/06/2021 0553   VLDL 19 08/06/2021 0553   LDLCALC 129 (H) 08/06/2021 0553   LDLCALC 136 (H) 07/31/2019 0848   LDLDIRECT 107 (H) 12/29/2021 1530    Physical Exam:    VS:  BP 100/60   Pulse 63   Ht 4' 11 (1.499 m)   Wt 113 lb (51.3 kg)   SpO2 96%   BMI 22.82 kg/m     Wt Readings from Last 3 Encounters:  04/09/24 113 lb (51.3 kg)  02/24/24 114 lb 11.2 oz (52 kg)  01/27/24 114 lb 3.2 oz (51.8 kg)      GEN:  Well nourished, well developed in no acute distress HEENT: Normal NECK: No JVD; No carotid bruits LYMPHATICS: No lymphadenopathy CARDIAC: RRR, no murmurs, no rubs, no gallops RESPIRATORY:  Clear to auscultation without rales, wheezing or rhonchi  ABDOMEN: Soft, non-tender, non-distended MUSCULOSKELETAL:  No edema; No deformity  SKIN: Warm and dry LOWER EXTREMITIES: no swelling NEUROLOGIC:  Alert and oriented x 3 PSYCHIATRIC:  Normal affect   ASSESSMENT:    1. Coronary artery disease with history of myocardial infarction without history of CABG   2. Primary hypertension   3. Pulmonary emphysema, unspecified emphysema type   4. Hx of CABG X 3  2019    PLAN:    In order of problems listed above:  Coronary disease, recent test reviewed with the patient, no evidence of ischemia.  Will continue antiplatelet therapy. Dyslipidemia she is taking Zetia  10 mg daily, her LDL was placed on reviewing KPN is 111 HDL 78.  Again I brought up issue of potentially using PCSK9 agent but she does not want to do it. Pulmonary emphysema.  Noted. History of coronary artery bypass graft.  Stable Record from hospitalization with you for the visit   Medication Adjustments/Labs and Tests Ordered: Current medicines are reviewed at length with the patient today.  Concerns regarding medicines are outlined above.  Orders Placed This Encounter  Procedures   EKG 12-Lead   Medication changes: No orders of the defined types were placed in this encounter.   Signed, Lamar DOROTHA Fitch, MD, [facility] 04/09/2024 9:27 AM    [facility]

## 2024-04-10 DIAGNOSIS — I872 Venous insufficiency (chronic) (peripheral): Secondary | ICD-10-CM | POA: Diagnosis not present

## 2024-04-10 DIAGNOSIS — L080 Pyoderma: Secondary | ICD-10-CM | POA: Diagnosis not present

## 2024-04-10 DIAGNOSIS — D692 Other nonthrombocytopenic purpura: Secondary | ICD-10-CM | POA: Diagnosis not present

## 2024-04-16 ENCOUNTER — Encounter (HOSPITAL_BASED_OUTPATIENT_CLINIC_OR_DEPARTMENT_OTHER): Payer: Self-pay | Admitting: Family Medicine

## 2024-04-16 ENCOUNTER — Ambulatory Visit (HOSPITAL_BASED_OUTPATIENT_CLINIC_OR_DEPARTMENT_OTHER): Admitting: Family Medicine

## 2024-04-16 VITALS — BP 135/76 | HR 66 | Temp 97.9°F | Resp 16 | Wt 118.1 lb

## 2024-04-16 DIAGNOSIS — E785 Hyperlipidemia, unspecified: Secondary | ICD-10-CM | POA: Insufficient documentation

## 2024-04-16 DIAGNOSIS — M810 Age-related osteoporosis without current pathological fracture: Secondary | ICD-10-CM

## 2024-04-16 DIAGNOSIS — Z23 Encounter for immunization: Secondary | ICD-10-CM | POA: Diagnosis not present

## 2024-04-16 DIAGNOSIS — I10 Essential (primary) hypertension: Secondary | ICD-10-CM | POA: Diagnosis not present

## 2024-04-16 DIAGNOSIS — I25810 Atherosclerosis of coronary artery bypass graft(s) without angina pectoris: Secondary | ICD-10-CM

## 2024-04-16 NOTE — Progress Notes (Signed)
 Patient is in office today for a nurse visit for Flu Injection. Patient Injection was given in the  Left deltoid. Patient tolerated injection well.

## 2024-04-16 NOTE — Assessment & Plan Note (Signed)
 Continue current care as dictated by Cardiology.

## 2024-04-16 NOTE — Assessment & Plan Note (Signed)
 Controlled.  DASH diet.

## 2024-04-16 NOTE — Progress Notes (Signed)
 Established Patient Office Visit  Subjective   Patient ID: Sarah Pena, female    DOB: 1941/01/18  Age: 83 y.o. MRN: [id number]  Chief Complaint  Patient presents with   Follow-up    Follow-up    F/u as above.  Please see last note for details.  She was quite pleased with her visit with [facility] dermatology and will continue to see them.  Otherwise patient is doing pretty well.  Extended discussion today.    Past Medical History:  Diagnosis Date   Asthma 05/2015   COPD (chronic obstructive pulmonary disease) (HCC)    f/by Dr. Jude of Pulmonology   Coronary artery disease with history of myocardial infarction without history of CABG 08/18/2017   f/by [facility]   Former smoker 07/27/2019   50 pack year history until 2015   GERD (gastroesophageal reflux disease) 08/11/2017   History of kidney stones    Hypertension 06/24/2020   Melanoma (HCC)    f/by Dermatology   Osteoporosis 06/24/2020   f/by her GYN   Pure hypercholesterolemia 06/24/2020   f/by [facility]   Vitamin D  deficiency 06/24/2020    Outpatient Encounter Medications as of 04/16/2024  Medication Sig   albuterol  (PROAIR  HFA) 108 (90 Base) MCG/ACT inhaler Inhale 2 puffs into the lungs every 6 (six) hours as needed for wheezing or shortness of breath.   aspirin  81 MG tablet Take 81 mg by mouth at bedtime.   calcium  carbonate (TUMS - DOSED IN MG ELEMENTAL CALCIUM ) 500 MG chewable tablet Chew 1 tablet by mouth daily as needed for indigestion or heartburn.   Cholecalciferol  (VITAMIN D -3) 1000 UNITS CAPS Take 1,000 Units by mouth daily.    doxycycline  (VIBRAMYCIN ) 100 MG capsule Take 1 capsule (100 mg total) by mouth 2 (two) times daily.   ezetimibe  (ZETIA ) 10 MG tablet Take 1 tablet (10 mg total) by mouth daily.   Fluticasone -Umeclidin-Vilant (TRELEGY ELLIPTA ) 100-62.5-25 MCG/ACT AEPB Inhale 1 puff into the lungs daily.   gabapentin  (NEURONTIN ) 100 MG capsule Take 1 capsule (100 mg total) by mouth at bedtime.    metoprolol  tartrate (LOPRESSOR ) 25 MG tablet Take 1 tablet (25 mg total) by mouth 2 (two) times daily.   nitroGLYCERIN  (NITROSTAT ) 0.4 MG SL tablet Place 1 tablet (0.4 mg total) under the tongue every 5 (five) minutes x 3 doses as needed for chest pain.   pantoprazole  (PROTONIX ) 20 MG tablet Take 20 mg by mouth daily.   Probiotic Product (MISC INTESTINAL FLORA REGULAT) CAPS    No facility-administered encounter medications on file as of 04/16/2024.    Social History   Tobacco Use   Smoking status: Former    Current packs/day: 0.00    Average packs/day: 1 pack/day for 50.0 years (50.0 ttl pk-yrs)    Types: Cigarettes    Start date: 05/22/1964    Quit date: 05/22/2014    Years since quitting: 9.9    Passive exposure: Past   Smokeless tobacco: Never   Tobacco comments:    quit 05/2014  Vaping Use   Vaping status: Never Used  Substance Use Topics   Alcohol use: Yes    Alcohol/week: 0.0 standard drinks of alcohol    Comment: rarely   Drug use: No      Review of Systems  Constitutional:  Negative for diaphoresis, fever, malaise/fatigue and weight loss.  Respiratory:  Negative for cough, shortness of breath and wheezing.   Cardiovascular:  Negative for chest pain, palpitations, orthopnea, claudication, leg swelling and PND.  Objective:     BP 135/76 (BP Location: Right Arm, Patient Position: Standing, Cuff Size: Normal)   Pulse 66   Temp 97.9 F (36.6 C) (Oral)   Resp 16   Wt 118 lb 1.6 oz (53.6 kg)   SpO2 94%   BMI 23.85 kg/m    Physical Exam Constitutional:      General: She is not in acute distress.    Appearance: Normal appearance.  HENT:     Head: Normocephalic.  Neck:     Vascular: No carotid bruit.  Cardiovascular:     Rate and Rhythm: Normal rate and regular rhythm.     Pulses: Normal pulses.     Heart sounds: Normal heart sounds.  Pulmonary:     Effort: Pulmonary effort is normal.     Breath sounds: Normal breath sounds.  Abdominal:      General: Bowel sounds are normal.     Palpations: Abdomen is soft.  Musculoskeletal:     Cervical back: Neck supple. No tenderness.     Right lower leg: No edema.     Left lower leg: No edema.  Neurological:     Mental Status: She is alert.      No results found for any visits on 04/16/24.    The ASCVD Risk score (Arnett DK, et al., 2019) failed to calculate for the following reasons:   The 2019 ASCVD risk score is only valid for ages 57 to 66   Risk score cannot be calculated because patient has a medical history suggesting prior/existing ASCVD    Assessment & Plan:  Coronary artery disease involving coronary bypass graft of native heart, unspecified whether angina present Assessment & Plan: Stable.  F/u with Cardiology as directed.   Primary hypertension Assessment & Plan: Controlled.  DASH diet.   Osteoporosis without current pathological fracture, unspecified osteoporosis type Assessment & Plan: F/u with GYN for this as directed.  Safety proofing and fall precautions.   Dyslipidemia Assessment & Plan: Continue current care as dictated by Cardiology.   Encounter for immunization -     Flu vaccine HIGH DOSE PF(Fluzone Trivalent)    Return in about 6 months (around 10/15/2024) for chronic follow-up.    REDDING PONCE NORLEEN FALCON., MD

## 2024-04-16 NOTE — Assessment & Plan Note (Signed)
 Stable.  F/u with Cardiology as directed.

## 2024-04-16 NOTE — Assessment & Plan Note (Signed)
 F/u with GYN for this as directed.  Safety proofing and fall precautions.

## 2024-04-18 DIAGNOSIS — H40013 Open angle with borderline findings, low risk, bilateral: Secondary | ICD-10-CM | POA: Diagnosis not present

## 2024-04-26 ENCOUNTER — Ambulatory Visit (HOSPITAL_BASED_OUTPATIENT_CLINIC_OR_DEPARTMENT_OTHER): Admitting: Pulmonary Disease

## 2024-05-21 ENCOUNTER — Other Ambulatory Visit: Payer: Self-pay | Admitting: Cardiology

## 2024-05-21 ENCOUNTER — Ambulatory Visit: Admitting: Cardiology

## 2024-05-24 DIAGNOSIS — H40013 Open angle with borderline findings, low risk, bilateral: Secondary | ICD-10-CM | POA: Diagnosis not present

## 2024-05-28 ENCOUNTER — Ambulatory Visit (HOSPITAL_BASED_OUTPATIENT_CLINIC_OR_DEPARTMENT_OTHER): Admitting: Pulmonary Disease

## 2024-05-28 ENCOUNTER — Encounter (HOSPITAL_BASED_OUTPATIENT_CLINIC_OR_DEPARTMENT_OTHER): Payer: Self-pay | Admitting: Pulmonary Disease

## 2024-05-28 VITALS — BP 137/68 | HR 65 | Temp 98.6°F | Ht 59.0 in | Wt 118.1 lb

## 2024-05-28 DIAGNOSIS — J4489 Other specified chronic obstructive pulmonary disease: Secondary | ICD-10-CM | POA: Diagnosis not present

## 2024-05-28 NOTE — Progress Notes (Signed)
   Subjective:    Patient ID: Sarah Pena, female    DOB: Aug 07, 1940, 83 y.o.   MRN: [id number]   83 yo ex- smoker For follow-up of moderate COPD From [place]   She quit smoking in 2016 , husband quit smoking December 2018  [facility] - CABG 08/2017 08/2021 PTCA to native RCA     Discussed the use of AI scribe software for clinical note transcription with the patient, who gave verbal consent to proceed.  History of Present Illness Sarah Pena is an 83 year old female with COPD who presents for a six-month follow-up. She is accompanied by her daughter, who is in the waiting room.  She is currently stable with no breathing difficulties. She experienced an episode of anxiety-related dyspnea one night, feeling unable to breathe, and used nitroglycerin  without relief, resulting in an overnight hospital observation.  She recently moved to a townhouse from a larger home, which has been stressful. Her previous home is on the market, contributing to her stress levels. Husband passed a year ago  She is managing her medications well and recently received a refill of TrelaVe at no cost due to insurance coverage. She is up to date with her flu and RSV vaccinations.  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 Also denies presyncope, palpitations, heartburn, abdominal pain, nausea, vomiting, diarrhea or change in bowel or urinary habits, dysuria,hematuria, rash, arthralgias, visual complaints, headache, numbness weakness or ataxia.      Objective:   Physical Exam  Gen. Pleasant, well-nourished, in no distress ENT - no thrush, no pallor/icterus,no post nasal drip Neck: No JVD, no thyromegaly, no carotid bruits Lungs: no use of accessory muscles, no dullness to percussion, clear without rales or rhonchi  Cardiovascular: Rhythm regular, heart sounds  normal, no murmurs or gallops, no peripheral edema Musculoskeletal: No deformities, no cyanosis or clubbing        Assessment & Plan:   Assessment and Plan Assessment & Plan Chronic obstructive pulmonary disease (COPD) She reports good breathing control and no significant dyspnea. She is compliant with medication regimen and has received necessary vaccinations. - Ensure annual follow-up visits. - Encourage contacting the clinic for any respiratory issues. - Maintain up-to-date vaccinations, including flu and RSV.

## 2024-10-15 ENCOUNTER — Ambulatory Visit (HOSPITAL_BASED_OUTPATIENT_CLINIC_OR_DEPARTMENT_OTHER): Admitting: Family Medicine
